# Patient Record
Sex: Female | Born: 1955 | Race: White | Hispanic: No | Marital: Single | Smoking: Former smoker
Health system: Southern US, Community
[De-identification: ages and names within clinical notes are randomized; demographics above are authoritative.]

## PROBLEM LIST (undated history)

## (undated) DIAGNOSIS — I10 Essential (primary) hypertension: Secondary | ICD-10-CM

## (undated) DIAGNOSIS — F329 Major depressive disorder, single episode, unspecified: Secondary | ICD-10-CM

## (undated) DIAGNOSIS — F631 Pyromania: Secondary | ICD-10-CM

## (undated) DIAGNOSIS — R011 Cardiac murmur, unspecified: Secondary | ICD-10-CM

## (undated) DIAGNOSIS — Z8719 Personal history of other diseases of the digestive system: Secondary | ICD-10-CM

## (undated) DIAGNOSIS — R569 Unspecified convulsions: Secondary | ICD-10-CM

## (undated) DIAGNOSIS — K219 Gastro-esophageal reflux disease without esophagitis: Secondary | ICD-10-CM

## (undated) DIAGNOSIS — M1990 Unspecified osteoarthritis, unspecified site: Secondary | ICD-10-CM

## (undated) DIAGNOSIS — F32A Depression, unspecified: Secondary | ICD-10-CM

## (undated) DIAGNOSIS — F1910 Other psychoactive substance abuse, uncomplicated: Secondary | ICD-10-CM

## (undated) HISTORY — PX: FOOT SURGERY: SHX648

## (undated) HISTORY — PX: COLONOSCOPY: SHX5424

---

## 2005-08-19 ENCOUNTER — Ambulatory Visit: Payer: Self-pay | Admitting: Family Medicine

## 2006-09-09 ENCOUNTER — Ambulatory Visit: Payer: Self-pay | Admitting: Family Medicine

## 2007-04-12 ENCOUNTER — Ambulatory Visit: Payer: Self-pay | Admitting: Gastroenterology

## 2007-09-14 ENCOUNTER — Ambulatory Visit: Payer: Self-pay | Admitting: Family Medicine

## 2008-06-13 ENCOUNTER — Emergency Department: Payer: Self-pay | Admitting: Emergency Medicine

## 2009-02-12 ENCOUNTER — Ambulatory Visit: Payer: Self-pay | Admitting: Family Medicine

## 2010-03-06 ENCOUNTER — Ambulatory Visit: Payer: Self-pay | Admitting: Family Medicine

## 2012-10-27 ENCOUNTER — Ambulatory Visit: Payer: Self-pay | Admitting: Family Medicine

## 2014-10-02 ENCOUNTER — Emergency Department: Payer: Self-pay | Admitting: Emergency Medicine

## 2016-03-31 NOTE — Patient Instructions (Addendum)
  Your procedure is scheduled on: 04-08-16 (TUESDAY) Report to Same Day Surgery 2nd floor medical mall To find out your arrival time please call [phone] between 1PM - 3PM on 04-07-16 [facility])  Remember: Instructions that are not followed completely may result in serious medical risk, up to and including death, or upon the discretion of your surgeon and anesthesiologist your surgery may need to be rescheduled.    _x___ 1. Do not eat food or drink liquids after midnight. No gum chewing or hard candies.     __x__ 2. No Alcohol for 24 hours before or after surgery.   __x__3. No Smoking for 24 prior to surgery.   ____  4. Bring all medications with you on the day of surgery if instructed.    __x__ 5. Notify your doctor if there is any change in your medical condition     (cold, fever, infections).     Do not wear jewelry, make-up, hairpins, clips or nail polish.  Do not wear lotions, powders, or perfumes. You may wear deodorant.  Do not shave 48 hours prior to surgery. Men may shave face and neck.  Do not bring valuables to the hospital.    [facility] is not responsible for any belongings or valuables.               Contacts, dentures or bridgework may not be worn into surgery.  Leave your suitcase in the car. After surgery it may be brought to your room.  For patients admitted to the hospital, discharge time is determined by your treatment team.   Patients discharged the day of surgery will not be allowed to drive home.    Please read over the following fact sheets that you were given:    _x___ Take these medicines the morning of surgery with A SIP OF WATER:    1. BENZTROPIN (COGENTIN)  2. GABAPENTIN (NEURONTIN)  3. KEPPRA (LEVETIRACETAM)  4. LISINOPRIL(ZESTRIL)  5. METOPROLOL             6. RISPERIDONE (RISPERDAL)             7. ESOMEPRAZOLE (NEXIUM)   ____ Fleet Enema (as directed)   ____ Use CHG Soap or sage wipes as directed on instruction sheet   ____ Use  inhalers on the day of surgery and bring to hospital day of surgery  ____ Stop metformin 2 days prior to surgery    ____ Take 1/2 of usual insulin dose the night before surgery and none on the morning of  surgery.   _X___ Stop aspirin or coumadin, or plavix-STOP ASPIRIN NOW  _x__ Stop Anti-inflammatories such as Advil, Aleve, Ibuprofen, Motrin, Naproxen,          Naprosyn, Goodies powders or aspirin products. Ok to take Tylenol   _X___ Stop supplements until after surgery-STOP PROBIOTIC NOW  ____ Bring C-Pap to the hospital.

## 2016-04-03 ENCOUNTER — Encounter
Admission: RE | Admit: 2016-04-03 | Discharge: 2016-04-03 | Disposition: A | Discharge status: Home / Self Care | Payer: Medicare Other | Source: Ambulatory Visit | Attending: Bariatrics | Admitting: Bariatrics

## 2016-04-03 DIAGNOSIS — Z01812 Encounter for preprocedural laboratory examination: Secondary | ICD-10-CM | POA: Insufficient documentation

## 2016-04-03 DIAGNOSIS — I10 Essential (primary) hypertension: Secondary | ICD-10-CM | POA: Insufficient documentation

## 2016-04-03 DIAGNOSIS — I447 Left bundle-branch block, unspecified: Secondary | ICD-10-CM | POA: Insufficient documentation

## 2016-04-03 HISTORY — DX: Unspecified convulsions: R56.9

## 2016-04-03 HISTORY — DX: Cardiac murmur, unspecified: R01.1

## 2016-04-03 HISTORY — DX: Personal history of other diseases of the digestive system: Z87.19

## 2016-04-03 HISTORY — DX: Essential (primary) hypertension: I10

## 2016-04-03 HISTORY — DX: Unspecified osteoarthritis, unspecified site: M19.90

## 2016-04-03 HISTORY — DX: Gastro-esophageal reflux disease without esophagitis: K21.9

## 2016-04-03 LAB — COMPREHENSIVE METABOLIC PANEL
ALBUMIN: 4 g/dL (ref 3.5–5.0)
ALT: 12 U/L — AB (ref 14–54)
AST: 20 U/L (ref 15–41)
Alkaline Phosphatase: 63 U/L (ref 38–126)
Anion gap: 9 (ref 5–15)
BUN: 7 mg/dL (ref 6–20)
CHLORIDE: 100 mmol/L — AB (ref 101–111)
CO2: 25 mmol/L (ref 22–32)
CREATININE: 0.45 mg/dL (ref 0.44–1.00)
Calcium: 8.8 mg/dL — ABNORMAL LOW (ref 8.9–10.3)
GFR calc Af Amer: >60 mL/min (ref >60)
Glucose, Bld: 89 mg/dL (ref 65–99)
POTASSIUM: 3.4 mmol/L — AB (ref 3.5–5.1)
SODIUM: 134 mmol/L — AB (ref 135–145)
Total Bilirubin: 0.2 mg/dL — ABNORMAL LOW (ref 0.3–1.2)
Total Protein: 6.5 g/dL (ref 6.5–8.1)

## 2016-04-07 ENCOUNTER — Encounter: Payer: Self-pay | Admitting: *Deleted

## 2016-04-08 ENCOUNTER — Inpatient Hospital Stay: Payer: Medicare Other | Admitting: Anesthesiology

## 2016-04-08 ENCOUNTER — Ambulatory Visit
Admission: RE | Admit: 2016-04-08 | Discharge: 2016-04-08 | Disposition: A | Discharge status: Home / Self Care | Payer: Medicare Other | Source: Ambulatory Visit | Attending: Bariatrics | Admitting: Bariatrics

## 2016-04-08 ENCOUNTER — Encounter
Admission: RE | Disposition: A | Discharge status: Home / Self Care | Payer: Self-pay | Source: Ambulatory Visit | Attending: Bariatrics

## 2016-04-08 DIAGNOSIS — K8010 Calculus of gallbladder with chronic cholecystitis without obstruction: Secondary | ICD-10-CM | POA: Insufficient documentation

## 2016-04-08 DIAGNOSIS — Z87891 Personal history of nicotine dependence: Secondary | ICD-10-CM | POA: Diagnosis not present

## 2016-04-08 DIAGNOSIS — K449 Diaphragmatic hernia without obstruction or gangrene: Secondary | ICD-10-CM | POA: Insufficient documentation

## 2016-04-08 DIAGNOSIS — Z8249 Family history of ischemic heart disease and other diseases of the circulatory system: Secondary | ICD-10-CM | POA: Diagnosis not present

## 2016-04-08 DIAGNOSIS — I10 Essential (primary) hypertension: Secondary | ICD-10-CM | POA: Insufficient documentation

## 2016-04-08 DIAGNOSIS — R569 Unspecified convulsions: Secondary | ICD-10-CM | POA: Diagnosis not present

## 2016-04-08 DIAGNOSIS — Z823 Family history of stroke: Secondary | ICD-10-CM | POA: Diagnosis not present

## 2016-04-08 DIAGNOSIS — K8020 Calculus of gallbladder without cholecystitis without obstruction: Secondary | ICD-10-CM

## 2016-04-08 HISTORY — PX: ESOPHAGOGASTRODUODENOSCOPY: SHX5428

## 2016-04-08 HISTORY — PX: CHOLECYSTECTOMY: SHX55

## 2016-04-08 HISTORY — PX: LAPAROSCOPY: SHX197

## 2016-04-08 SURGERY — LAPAROSCOPIC CHOLECYSTECTOMY WITH INTRAOPERATIVE CHOLANGIOGRAM
Anesthesia: General

## 2016-04-08 MED ORDER — GLYCOPYRROLATE 0.2 MG/ML IJ SOLN
INTRAMUSCULAR | Status: DC | PRN
  Administered 2016-04-08: 0.2 mg via INTRAVENOUS

## 2016-04-08 MED ORDER — FENTANYL CITRATE (PF) 100 MCG/2ML IJ SOLN: 25.0000 ug | INTRAMUSCULAR | Status: DC | PRN

## 2016-04-08 MED ORDER — FENTANYL CITRATE (PF) 100 MCG/2ML IJ SOLN
INTRAMUSCULAR | Status: DC | PRN
Start: 2016-04-08 — End: 2016-04-08
  Administered 2016-04-08: 100 ug via INTRAVENOUS
  Administered 2016-04-08: 50 ug via INTRAVENOUS

## 2016-04-08 MED ORDER — SCOPOLAMINE 1 MG/3DAYS TD PT72
1.0000 | MEDICATED_PATCH | Freq: Once | TRANSDERMAL | Status: DC
  Administered 2016-04-08: 1.5 mg via TRANSDERMAL

## 2016-04-08 MED ORDER — CEFAZOLIN SODIUM-DEXTROSE 2-4 GM/100ML-% IV SOLN
INTRAVENOUS | Status: AC
  Administered 2016-04-08: 2 g via INTRAVENOUS
  Filled 2016-04-08: qty 100

## 2016-04-08 MED ORDER — CEFAZOLIN SODIUM-DEXTROSE 2-4 GM/100ML-% IV SOLN
2.0000 g | Freq: Once | INTRAVENOUS | Status: AC
  Administered 2016-04-08: 2 g via INTRAVENOUS

## 2016-04-08 MED ORDER — PROMETHAZINE HCL 25 MG/ML IJ SOLN: 6.2500 mg | INTRAMUSCULAR | Status: DC | PRN

## 2016-04-08 MED ORDER — EPHEDRINE SULFATE 50 MG/ML IJ SOLN
INTRAMUSCULAR | Status: DC | PRN
  Administered 2016-04-08: 10 mg via INTRAVENOUS

## 2016-04-08 MED ORDER — BUPIVACAINE-EPINEPHRINE 0.25% -1:200000 IJ SOLN
INTRAMUSCULAR | Status: DC | PRN
  Administered 2016-04-08: 30 mL

## 2016-04-08 MED ORDER — ONDANSETRON HCL 4 MG/2ML IJ SOLN
INTRAMUSCULAR | Status: DC | PRN
  Administered 2016-04-08: 4 mg via INTRAVENOUS

## 2016-04-08 MED ORDER — LIDOCAINE HCL (CARDIAC) 20 MG/ML IV SOLN
INTRAVENOUS | Status: DC | PRN
  Administered 2016-04-08: 30 mg via INTRAVENOUS

## 2016-04-08 MED ORDER — SCOPOLAMINE 1 MG/3DAYS TD PT72
MEDICATED_PATCH | TRANSDERMAL | Status: AC
  Administered 2016-04-08: 1.5 mg via TRANSDERMAL
  Filled 2016-04-08: qty 1

## 2016-04-08 MED ORDER — DEXAMETHASONE SODIUM PHOSPHATE 10 MG/ML IJ SOLN
INTRAMUSCULAR | Status: DC | PRN
  Administered 2016-04-08: 10 mg via INTRAVENOUS

## 2016-04-08 MED ORDER — PROPOFOL 10 MG/ML IV BOLUS
INTRAVENOUS | Status: DC | PRN
  Administered 2016-04-08: 120 mg via INTRAVENOUS

## 2016-04-08 MED ORDER — ROCURONIUM BROMIDE 100 MG/10ML IV SOLN
INTRAVENOUS | Status: DC | PRN
  Administered 2016-04-08: 10 mg via INTRAVENOUS
  Administered 2016-04-08: 30 mg via INTRAVENOUS

## 2016-04-08 MED ORDER — ACETAMINOPHEN 10 MG/ML IV SOLN
INTRAVENOUS | Status: DC | PRN
  Administered 2016-04-08: 1000 mg via INTRAVENOUS

## 2016-04-08 MED ORDER — LACTATED RINGERS IV SOLN
INTRAVENOUS | Status: DC
  Administered 2016-04-08: 13:00:00 via INTRAVENOUS

## 2016-04-08 SURGICAL SUPPLY — 42 items
APPLIER CLIP ROT 10 11.4 M/L (STAPLE)
BANDAGE ELASTIC 6 LF NS (GAUZE/BANDAGES/DRESSINGS) ×6 IMPLANT
BLADE SURG SZ11 CARB STEEL (BLADE) ×3 IMPLANT
CANISTER SUCT 1200ML W/VALVE (MISCELLANEOUS) ×3 IMPLANT
CHLORAPREP W/TINT 26ML (MISCELLANEOUS) ×3 IMPLANT
CLIP APPLIE ROT 10 11.4 M/L (STAPLE) IMPLANT
DEFOGGER SCOPE WARMER CLEARIFY (MISCELLANEOUS) ×3 IMPLANT
DRAPE UTILITY 15X26 TOWEL STRL (DRAPES) ×6 IMPLANT
ENDOLOOP SUT PDS II  0 18 (SUTURE) ×2
ENDOLOOP SUT PDS II 0 18 (SUTURE) ×1 IMPLANT
FILTER LAP SMOKE EVAC STRL (MISCELLANEOUS) ×3 IMPLANT
GLOVE BIO SURGEON STRL SZ7 (GLOVE) ×6 IMPLANT
GLOVE BIOGEL PI IND STRL 8.5 (GLOVE) ×1 IMPLANT
GLOVE BIOGEL PI INDICATOR 8.5 (GLOVE) ×2
GLOVE SURG SYN 8.0 (GLOVE) ×3 IMPLANT
GOWN STRL REUS W/ TWL LRG LVL3 (GOWN DISPOSABLE) ×5 IMPLANT
GOWN STRL REUS W/TWL LRG LVL3 (GOWN DISPOSABLE) ×10
GRASPER SUT TROCAR 14GX15 (MISCELLANEOUS) ×3 IMPLANT
IRRIGATION STRYKERFLOW (MISCELLANEOUS) IMPLANT
IRRIGATOR STRYKERFLOW (MISCELLANEOUS)
IV NS 1000ML (IV SOLUTION)
IV NS 1000ML BAXH (IV SOLUTION) IMPLANT
KIT RM TURNOVER STRD PROC AR (KITS) ×3 IMPLANT
LABEL OR SOLS (LABEL) ×3 IMPLANT
LIQUID BAND (GAUZE/BANDAGES/DRESSINGS) ×3 IMPLANT
NDL SAFETY 22GX1.5 (NEEDLE) ×3 IMPLANT
NEEDLE HYPO 22GX1.5 SAFETY (NEEDLE) ×3 IMPLANT
NS IRRIG 500ML POUR BTL (IV SOLUTION) ×3 IMPLANT
PACK LAP CHOLECYSTECTOMY (MISCELLANEOUS) ×3 IMPLANT
RELOAD BLUE (STAPLE) IMPLANT
RELOAD GOLD (STAPLE) IMPLANT
RELOAD GREEN (STAPLE) IMPLANT
SHEARS HARMONIC ACE PLUS 45CM (MISCELLANEOUS) ×3 IMPLANT
SLEEVE ENDOPATH XCEL 5M (ENDOMECHANICALS) ×6 IMPLANT
SLEEVE GASTRECTOMY 36FR VISIGI (MISCELLANEOUS) ×3 IMPLANT
STAPLER ECHELON LONG 60 440 (INSTRUMENTS) IMPLANT
SUT MNCRL AB 4-0 PS2 18 (SUTURE) ×3 IMPLANT
SUT VIC AB 0 CT2 27 (SUTURE) ×3 IMPLANT
SYR 20CC LL (SYRINGE) ×3 IMPLANT
TROCAR XCEL 12X100 BLDLESS (ENDOMECHANICALS) IMPLANT
TROCAR XCEL NON-BLD 5MMX100MML (ENDOMECHANICALS) ×3 IMPLANT
TUBING INSUFFLATOR HEATED (MISCELLANEOUS) ×3 IMPLANT

## 2016-04-08 NOTE — Op Note (Signed)
PATIENT: Tracey Wyatt [birth year]  PROCEDURE PERFORMED: LAPAROSCOPIC CHOLECYSTECTOMY, UPPER ENDOSCOPY PRE-OP DIAGNOSIS: gallstone POST-OP DIAGNOSIS: gallstone ESTIMATED BLOOD LOSS: Minimal SURGEON: Ladora Daniel  ASSISTANT: Liliane Bade PA  PROCEDURE NOTE: Patient was brought to the operating room placed in supine position. General anesthesia obtained with orotracheal intubation. The abdomen and chest were sterilely prepped and draped. A varies needle introduced through a small incision in the superior fold of the umbilicus as confirmed by saline drop test. Pneumoperitoneum obtained with carbon dioxide. A 5 mm Optiview trocar then introduced into the abdominal cavity. 3 additional 5 mm trochars introduced across the upper abdomen. The gallbladder was elevated and the thickened peritoneum at the base of gallbladder scored by use of the Harmonic scalpel and a combination of blunt dissection and use of the Harmonic scalpel used to isolate the cystic duct and accompanying cystic artery. There was noted be significant densely adherent hypervascular omentum the anterior aspect of the gallbladder which was also mobilized as the cystic duct and cystic artery were identified by use of the Harmonic scalpel. A retrograde dissection of gallbladder was then performed freeing the gallbladder from the liver bed resulting in excellent hemostasis. Cystic artery then divided at several of its branch points along the base of the gallbladder. The cystic duct was then isolated proximally over distance of approximately 1and1/2 cm. The cystic duct then ligated with a 0 PDS Endoloop. The cystic duct was then divided distal to the suture.. The gallbladder was then decompressed by aspiration and retrieval of multiple heme-based gallstones. The gallbladder was then delivered through the epigastric 5 mm trocar site after dilatation the fascia by way of a hemostat. Repeat inspection of the gallbladder fossa confirmed adequate  hemostasis. The pneumoperitoneum relieved and the trochars removed. Wounds injected with quarter percent Marcaine with epinephrine and then closed with 4-0 Monocryl in the dermis followed by Dermabond. An intraoperative endoscopy then performed with the endoscope and passed under direct visualization into the esophagus. The esophagus easily cannulated as was the stomach. The patient was noted have extensive amount of bile secretions within the stomach which were easily cleared. The pylorus was widely patent. Proximal duodenum also noted be unremarkable. Within the stomach the scope was placed in the J position the patient noted have a paraesophageal hiatal hernia with approximately 3 cm of stomach extending into the lower mediastinum above the distal esophagus. No ulcerations appreciated. The distal esophagus from work for a singular tongue of what type mucosa extending above the Z line no ulcerations or mass effect associated with it. Above this the esophageal motility was noted noted to be markedly diminished. Patient allowed recover this point having tolerated the procedure well.

## 2016-04-08 NOTE — H&P (Signed)
History and physical on paper chart without changes noted.

## 2016-04-08 NOTE — Anesthesia Preprocedure Evaluation (Signed)
Anesthesia Evaluation  Patient identified by MRN, date of birth, ID band Patient awake    Reviewed: Allergy & Precautions, H&P , NPO status , Patient's Chart, lab work & pertinent test results, reviewed documented beta blocker date and time   Airway Mallampati: II  TM Distance: >3 FB Neck ROM: full    Dental no notable dental hx. (+) Edentulous Upper, Edentulous Lower   Pulmonary neg pulmonary ROS, former smoker,    Pulmonary exam normal breath sounds clear to auscultation       Cardiovascular Exercise Tolerance: Good hypertension, (-) angina(-) CAD, (-) Past MI, (-) Cardiac Stents and (-) CABG Normal cardiovascular exam(-) dysrhythmias + Valvular Problems/Murmurs  Rhythm:regular Rate:Normal     Neuro/Psych Seizures -, Well Controlled,  negative psych ROS   GI/Hepatic Neg liver ROS, hiatal hernia, GERD  ,  Endo/Other  negative endocrine ROS  Renal/GU negative Renal ROS  negative genitourinary   Musculoskeletal   Abdominal   Peds  Hematology negative hematology ROS (+)   Anesthesia Other Findings Past Medical History: No date: Arthritis No date: GERD (gastroesophageal reflux disease) No date: Heart murmur No date: History of hiatal hernia No date: Hypertension No date: Seizures ([place])     Comment: LAST ONE IN 2002 APPROXIMATELY   Reproductive/Obstetrics negative OB ROS                             Anesthesia Physical Anesthesia Plan  ASA: II  Anesthesia Plan: General   Post-op Pain Management:    Induction:   Airway Management Planned:   Additional Equipment:   Intra-op Plan:   Post-operative Plan:   Informed Consent: I have reviewed the patients History and Physical, chart, labs and discussed the procedure including the risks, benefits and alternatives for the proposed anesthesia with the patient or authorized representative who has indicated his/her understanding and  acceptance.   Dental Advisory Given  Plan Discussed with: Anesthesiologist, CRNA and Surgeon  Anesthesia Plan Comments:         Anesthesia Quick Evaluation

## 2016-04-08 NOTE — Transfer of Care (Signed)
Immediate Anesthesia Transfer of Care Note  Patient: Tracey Wyatt  Procedure(s) Performed: Procedure(s): LAPAROSCOPIC CHOLECYSTECTOMY WITH INTRAOPERATIVE CHOLANGIOGRAM (N/A) LAPAROSCOPY DIAGNOSTIC (N/A) ESOPHAGOGASTRODUODENOSCOPY (EGD) (N/A)  Patient Location: PACU  Anesthesia Type:General  Level of Consciousness: patient cooperative and lethargic  Airway & Oxygen Therapy: Patient Spontanous Breathing and Patient connected to face mask oxygen  Post-op Assessment: Report given to RN and Post -op Vital signs reviewed and stable  Post vital signs: Reviewed and stable  Last Vitals:  Vitals:   04/08/16 1242  BP: (!) 149/88  Pulse: 64  Resp: 16  Temp: 36.3 C    Last Pain:  Vitals:   04/08/16 1242  TempSrc: Tympanic         Complications: No apparent anesthesia complications

## 2016-04-08 NOTE — OR Nursing (Signed)
Called [facility] patient is a resident and got report from [place].

## 2016-04-08 NOTE — Brief Op Note (Cosign Needed)
04/08/2016  2:58 PM  PATIENT:  Tracey Wyatt  60 y.o. female  PRE-OPERATIVE DIAGNOSIS:  gallstone  POST-OPERATIVE DIAGNOSIS:  gallstone  PROCEDURE:  Procedure(s): LAPAROSCOPIC CHOLECYSTECTOMY WITH INTRAOPERATIVE CHOLANGIOGRAM (N/A) LAPAROSCOPY DIAGNOSTIC (N/A) ESOPHAGOGASTRODUODENOSCOPY (EGD) (N/A)  SURGEON:  Surgeon(s) and Role:    * Ladora Daniel, MD - Primary  PHYSICIAN ASSISTANT:   ASSISTANTS: Nawal Burling, PA-C   ANESTHESIA:   general  EBL:  No intake/output data recorded.  BLOOD ADMINISTERED:none  DRAINS: none   LOCAL MEDICATIONS USED:  BUPIVICAINE   SPECIMEN:  Source of Specimen:  Gallbladder and stones  DISPOSITION OF SPECIMEN:  PATHOLOGY  COUNTS:  YES  TOURNIQUET:  * No tourniquets in log *  DICTATION: .Dragon Dictation  PLAN OF CARE: Discharge to home after PACU  PATIENT DISPOSITION:  PACU - hemodynamically stable.   Delay start of Pharmacological VTE agent (>24hrs) due to surgical blood loss or risk of bleeding: no

## 2016-04-08 NOTE — Discharge Instructions (Signed)

## 2016-04-08 NOTE — Anesthesia Procedure Notes (Signed)
Procedure Name: Intubation Date/Time: 04/08/2016 1:26 PM Performed by: Jonna Clark Pre-anesthesia Checklist: Patient identified, Patient being monitored, Timeout performed, Emergency Drugs available and Suction available Patient Re-evaluated:Patient Re-evaluated prior to inductionOxygen Delivery Method: Circle system utilized Preoxygenation: Pre-oxygenation with 100% oxygen Intubation Type: IV induction Ventilation: Mask ventilation without difficulty Laryngoscope Size: Mac and 3 Grade View: Grade I Tube type: Oral Tube size: 7.0 mm Number of attempts: 1 Placement Confirmation: ETT inserted through vocal cords under direct vision,  positive ETCO2 and breath sounds checked- equal and bilateral Secured at: 21 cm Tube secured with: Tape Dental Injury: Teeth and Oropharynx as per pre-operative assessment

## 2016-04-09 ENCOUNTER — Encounter: Payer: Self-pay | Admitting: Bariatrics

## 2016-04-09 NOTE — Anesthesia Postprocedure Evaluation (Signed)
Anesthesia Post Note  Patient: Tracey Wyatt  Procedure(s) Performed: Procedure(s) (LRB): LAPAROSCOPIC CHOLECYSTECTOMY WITH INTRAOPERATIVE CHOLANGIOGRAM (N/A) LAPAROSCOPY DIAGNOSTIC (N/A) ESOPHAGOGASTRODUODENOSCOPY (EGD) (N/A)  Patient location during evaluation: PACU Anesthesia Type: General Level of consciousness: awake and alert Pain management: pain level controlled Vital Signs Assessment: post-procedure vital signs reviewed and stable Respiratory status: spontaneous breathing, nonlabored ventilation, respiratory function stable and patient connected to nasal cannula oxygen Cardiovascular status: blood pressure returned to baseline and stable Postop Assessment: no signs of nausea or vomiting Anesthetic complications: no    Last Vitals:  Vitals:   04/08/16 1606 04/08/16 1625  BP: 124/67 132/66  Pulse: 66 65  Resp: 16 16  Temp: 36.8 C     Last Pain:  Vitals:   04/09/16 0838  TempSrc:   PainSc: 3                  Martha Clan

## 2016-04-11 LAB — SURGICAL PATHOLOGY

## 2016-09-24 ENCOUNTER — Telehealth: Payer: Self-pay

## 2016-09-24 ENCOUNTER — Other Ambulatory Visit: Payer: Self-pay

## 2016-09-24 DIAGNOSIS — Z1211 Encounter for screening for malignant neoplasm of colon: Secondary | ICD-10-CM

## 2016-09-24 NOTE — Telephone Encounter (Signed)
Gastroenterology Pre-Procedure Review  Request Date: 10/14/16 Requesting Physician: Dr. Vicente Males  PATIENT REVIEW QUESTIONS: The patient responded to the following health history questions as indicated:    1. Are you having any GI issues? no 2. Do you have a personal history of Polyps? no 3. Do you have a family history of Colon Cancer or Polyps? yes (Brother) 4. Diabetes Mellitus? no 5. Joint replacements in the past 12 months?no 6. Major health problems in the past 3 months?yes (arm fracture) 7. Any artificial heart valves, MVP, or defibrillator?no    MEDICATIONS & ALLERGIES:    Patient reports the following regarding taking any anticoagulation/antiplatelet therapy:   Plavix, Coumadin, Eliquis, Xarelto, Lovenox, Pradaxa, Brilinta, or Effient? no Aspirin? yes (325mg )  Patient confirms/reports the following medications:  Current Outpatient Prescriptions  Medication Sig Dispense Refill  . Acetaminophen (MAPAP) 500 MG coapsule Take 2 capsules by mouth every 6 (six) hours as needed for fever.    Marland Kitchen alendronate (FOSAMAX) 70 MG tablet Take 70 mg by mouth once a week. Take with a full glass of water on an empty stomach EVERY WEDNESDAY    . aspirin 325 MG tablet Take 325 mg by mouth daily.    . benztropine (COGENTIN) 0.5 MG tablet Take 0.5 mg by mouth 2 (two) times daily.    . Calcium Carbonate-Vitamin D3 (CALCIUM 600-D) 600-400 MG-UNIT TABS Take 1 tablet by mouth 2 (two) times daily.    . Cyanocobalamin (VITAMIN B 12 PO) Take 1,000 mcg by mouth daily.    Marland Kitchen docusate sodium (COLACE) 100 MG capsule Take 100 mg by mouth 2 (two) times daily.    Marland Kitchen esomeprazole (NEXIUM) 40 MG capsule Take 40 mg by mouth 2 (two) times daily before a meal.     . gabapentin (NEURONTIN) 300 MG capsule Take 300 mg by mouth 4 (four) times daily.    Marland Kitchen HYDROcodone-acetaminophen (NORCO/VICODIN) 5-325 MG tablet Take 1 tablet by mouth every 6 (six) hours as needed for moderate pain.    Marland Kitchen levETIRAcetam (KEPPRA) 500 MG tablet Take  500 mg by mouth 2 (two) times daily.    Marland Kitchen lisinopril (PRINIVIL,ZESTRIL) 10 MG tablet Take 10 mg by mouth every morning.    . metoprolol succinate (TOPROL-XL) 25 MG 24 hr tablet Take 25 mg by mouth 2 (two) times daily.     . Multiple Vitamin (MULTIVITAMIN) tablet Take 1 tablet by mouth daily.    . ondansetron (ZOFRAN) 4 MG tablet Take 4 mg by mouth every 8 (eight) hours as needed for nausea or vomiting.    Marland Kitchen PARoxetine (PAXIL) 40 MG tablet Take 40 mg by mouth at bedtime.     . polyethylene glycol (MIRALAX / GLYCOLAX) packet Take 17 g by mouth daily.    . Probiotic Product (PROBIOTIC DAILY PO) Take 1 tablet by mouth daily.    . risperiDONE (RISPERDAL) 0.5 MG tablet Take 0.5 mg by mouth 2 (two) times daily.      No current facility-administered medications for this visit.     Patient confirms/reports the following allergies:  Not on File  No orders of the defined types were placed in this encounter.   AUTHORIZATION INFORMATION Primary Insurance: 1D#: Group #:  Secondary Insurance: 1D#: Group #:  SCHEDULE INFORMATION: Date: 10/14/16 Time: Location: [place]

## 2016-10-21 ENCOUNTER — Telehealth: Payer: Self-pay | Admitting: Gastroenterology

## 2016-10-21 NOTE — Telephone Encounter (Signed)
Tracey Wyatt w/ [facility] called and caretaker had her procedure date down wrong and needs to reschedule 10/23/16. Please call Elvis Coil (caretaker) [phone].

## 2016-10-23 ENCOUNTER — Ambulatory Visit: Admission: RE | Admit: 2016-10-23 | Payer: Medicare Other | Source: Ambulatory Visit | Admitting: Gastroenterology

## 2016-10-23 ENCOUNTER — Encounter: Admission: RE | Payer: Self-pay | Source: Ambulatory Visit

## 2016-10-23 ENCOUNTER — Other Ambulatory Visit: Payer: Self-pay

## 2016-10-23 DIAGNOSIS — Z1211 Encounter for screening for malignant neoplasm of colon: Secondary | ICD-10-CM

## 2016-10-23 SURGERY — COLONOSCOPY WITH PROPOFOL
Anesthesia: General

## 2016-10-24 ENCOUNTER — Telehealth: Payer: Self-pay | Admitting: Gastroenterology

## 2016-10-24 NOTE — Telephone Encounter (Signed)
Queen from [facility] needs to reschedule pt colonoscopy to April 24 or 25th. She will be out of town.  [phone]

## 2016-10-27 NOTE — Telephone Encounter (Signed)
Received VM from [place] at Nursing facility requesting 2nd reschedule of procedure.   LVM for callback.

## 2016-10-28 ENCOUNTER — Encounter: Admission: RE | Payer: Self-pay | Source: Ambulatory Visit

## 2016-10-28 ENCOUNTER — Ambulatory Visit: Admission: RE | Admit: 2016-10-28 | Payer: Medicare Other | Source: Ambulatory Visit | Admitting: Gastroenterology

## 2016-10-28 SURGERY — COLONOSCOPY WITH PROPOFOL
Anesthesia: General

## 2016-10-29 ENCOUNTER — Other Ambulatory Visit: Payer: Self-pay

## 2016-10-29 DIAGNOSIS — Z1211 Encounter for screening for malignant neoplasm of colon: Secondary | ICD-10-CM

## 2016-11-18 ENCOUNTER — Encounter
Admission: RE | Disposition: A | Discharge status: Home / Self Care | Payer: Self-pay | Source: Ambulatory Visit | Attending: Gastroenterology

## 2016-11-18 ENCOUNTER — Ambulatory Visit: Payer: Medicare Other | Admitting: Registered Nurse

## 2016-11-18 ENCOUNTER — Ambulatory Visit
Admission: RE | Admit: 2016-11-18 | Discharge: 2016-11-18 | Disposition: A | Discharge status: Home / Self Care | Payer: Medicare Other | Source: Ambulatory Visit | Attending: Gastroenterology | Admitting: Gastroenterology

## 2016-11-18 ENCOUNTER — Encounter: Payer: Self-pay | Admitting: *Deleted

## 2016-11-18 DIAGNOSIS — Z8371 Family history of colonic polyps: Secondary | ICD-10-CM | POA: Insufficient documentation

## 2016-11-18 DIAGNOSIS — Z87891 Personal history of nicotine dependence: Secondary | ICD-10-CM | POA: Insufficient documentation

## 2016-11-18 DIAGNOSIS — M1990 Unspecified osteoarthritis, unspecified site: Secondary | ICD-10-CM | POA: Insufficient documentation

## 2016-11-18 DIAGNOSIS — Z955 Presence of coronary angioplasty implant and graft: Secondary | ICD-10-CM | POA: Insufficient documentation

## 2016-11-18 DIAGNOSIS — D123 Benign neoplasm of transverse colon: Secondary | ICD-10-CM | POA: Diagnosis not present

## 2016-11-18 DIAGNOSIS — D122 Benign neoplasm of ascending colon: Secondary | ICD-10-CM | POA: Diagnosis not present

## 2016-11-18 DIAGNOSIS — F329 Major depressive disorder, single episode, unspecified: Secondary | ICD-10-CM | POA: Insufficient documentation

## 2016-11-18 DIAGNOSIS — Z79899 Other long term (current) drug therapy: Secondary | ICD-10-CM | POA: Insufficient documentation

## 2016-11-18 DIAGNOSIS — Z1211 Encounter for screening for malignant neoplasm of colon: Secondary | ICD-10-CM | POA: Diagnosis not present

## 2016-11-18 DIAGNOSIS — K219 Gastro-esophageal reflux disease without esophagitis: Secondary | ICD-10-CM | POA: Insufficient documentation

## 2016-11-18 DIAGNOSIS — I10 Essential (primary) hypertension: Secondary | ICD-10-CM | POA: Diagnosis not present

## 2016-11-18 DIAGNOSIS — D125 Benign neoplasm of sigmoid colon: Secondary | ICD-10-CM | POA: Diagnosis not present

## 2016-11-18 DIAGNOSIS — R569 Unspecified convulsions: Secondary | ICD-10-CM | POA: Insufficient documentation

## 2016-11-18 DIAGNOSIS — Z7982 Long term (current) use of aspirin: Secondary | ICD-10-CM | POA: Insufficient documentation

## 2016-11-18 HISTORY — DX: Depression, unspecified: F32.A

## 2016-11-18 HISTORY — DX: Major depressive disorder, single episode, unspecified: F32.9

## 2016-11-18 HISTORY — PX: COLONOSCOPY WITH PROPOFOL: SHX5780

## 2016-11-18 LAB — HM COLONOSCOPY

## 2016-11-18 SURGERY — COLONOSCOPY WITH PROPOFOL
Anesthesia: General

## 2016-11-18 MED ORDER — LIDOCAINE HCL (PF) 2 % IJ SOLN
INTRAMUSCULAR | Status: AC
  Filled 2016-11-18: qty 2

## 2016-11-18 MED ORDER — METHYLENE BLUE 0.5 % INJ SOLN
INTRAVENOUS | Status: DC | PRN
  Administered 2016-11-18: 1 mL via SUBMUCOSAL

## 2016-11-18 MED ORDER — METHYLENE BLUE 0.5 % INJ SOLN
INTRAVENOUS | Status: AC
  Filled 2016-11-18: qty 10

## 2016-11-18 MED ORDER — EPHEDRINE SULFATE 50 MG/ML IJ SOLN
INTRAMUSCULAR | Status: DC | PRN
  Administered 2016-11-18: 10 mg via INTRAVENOUS
  Administered 2016-11-18: 15 mg via INTRAVENOUS

## 2016-11-18 MED ORDER — EPHEDRINE SULFATE 50 MG/ML IJ SOLN
INTRAMUSCULAR | Status: AC
  Filled 2016-11-18: qty 2

## 2016-11-18 MED ORDER — SODIUM CHLORIDE 0.9 % IV SOLN
INTRAVENOUS | Status: DC
  Administered 2016-11-18: 1000 mL via INTRAVENOUS
  Administered 2016-11-18: 10:00:00 via INTRAVENOUS

## 2016-11-18 MED ORDER — PROPOFOL 10 MG/ML IV BOLUS
INTRAVENOUS | Status: DC | PRN
  Administered 2016-11-18: 50 mg via INTRAVENOUS

## 2016-11-18 MED ORDER — PROPOFOL 500 MG/50ML IV EMUL
INTRAVENOUS | Status: DC | PRN
  Administered 2016-11-18: 125 ug/kg/min via INTRAVENOUS

## 2016-11-18 MED ORDER — PROPOFOL 10 MG/ML IV BOLUS
INTRAVENOUS | Status: AC
  Filled 2016-11-18: qty 40

## 2016-11-18 MED ORDER — MIDAZOLAM HCL 2 MG/2ML IJ SOLN
INTRAMUSCULAR | Status: AC
  Filled 2016-11-18: qty 2

## 2016-11-18 MED ORDER — LIDOCAINE HCL (CARDIAC) 20 MG/ML IV SOLN
INTRAVENOUS | Status: DC | PRN
  Administered 2016-11-18: 60 mg via INTRAVENOUS

## 2016-11-18 NOTE — Transfer of Care (Signed)
Immediate Anesthesia Transfer of Care Note  Patient: Tracey Wyatt  Procedure(s) Performed: Procedure(s): COLONOSCOPY WITH PROPOFOL (N/A)  Patient Location: PACU and Endoscopy Unit  Anesthesia Type:General  Level of Consciousness: sedated  Airway & Oxygen Therapy: Patient Spontanous Breathing and Patient connected to nasal cannula oxygen  Post-op Assessment: Report given to RN and Post -op Vital signs reviewed and stable  Post vital signs: Reviewed and stable  Last Vitals:  Vitals:   11/18/16 1045 11/18/16 1047  BP:  (!) 109/57  Pulse:  69  Resp:  19  Temp: (P) 37.1 C 67.8 C    Complications: No apparent anesthesia complications

## 2016-11-18 NOTE — Anesthesia Procedure Notes (Signed)
Date/Time: 11/18/2016 9:55 AM Performed by: Doreen Salvage Pre-anesthesia Checklist: Patient identified, Emergency Drugs available, Suction available and Patient being monitored Patient Re-evaluated:Patient Re-evaluated prior to inductionOxygen Delivery Method: Nasal cannula Intubation Type: IV induction Dental Injury: Teeth and Oropharynx as per pre-operative assessment  Comments: Nasal cannula with etCO2 monitoring

## 2016-11-18 NOTE — Op Note (Signed)
[facility] Gastroenterology Patient Name: Tracey Wyatt Procedure Date: 11/18/2016 9:52 AM MRN: [id number] Account #: [id number] Date of Birth: 1956/06/23 Admit Type: Outpatient Age: 61 Room: [facility] ENDO ROOM 1 Gender: Female Note Status: Finalized Procedure:            Colonoscopy Indications:          Colon cancer screening in patient at increased risk:                        Family history of 1st-degree relative with colon polyps Providers:            Jonathon Bellows MD, MD Referring MD:         Birdie Sons (Referring MD) Medicines:            Monitored Anesthesia Care Complications:        No immediate complications. Procedure:            Pre-Anesthesia Assessment:                       - Prior to the procedure, a History and Physical was                        performed, and patient medications, allergies and                        sensitivities were reviewed. The patient's tolerance of                        previous anesthesia was reviewed.                       - The risks and benefits of the procedure and the                        sedation options and risks were discussed with the                        patient. All questions were answered and informed                        consent was obtained.                       - ASA Grade Assessment: III - A patient with severe                        systemic disease.                       After obtaining informed consent, the colonoscope was                        passed under direct vision. Throughout the procedure,                        the patient's blood pressure, pulse, and oxygen                        saturations were monitored continuously. The  Colonoscope was introduced through the anus and                        advanced to the the cecum, identified by the                        appendiceal orifice, IC valve and transillumination.                        The colonoscopy was  performed with ease. The patient                        tolerated the procedure well. The quality of the bowel                        preparation was good. Findings:      Three sessile polyps were found in the sigmoid colon and ascending       colon. The polyps were 6 to 10 mm in size. These polyps were removed       with a cold snare. Resection and retrieval were complete.      A 10 mm polyp was found in the hepatic flexure. The polyp was sessile.       To prevent bleeding post-intervention, one hemostatic clip was       successfully placed. There was no bleeding during the maneuver.      The exam was otherwise without abnormality on direct and retroflexion       views. Impression:           - Three 6 to 10 mm polyps in the sigmoid colon and in                        the ascending colon, removed with a cold snare.                        Resected and retrieved.                       - One 10 mm polyp at the hepatic flexure. Clip was                        placed.                       - The examination was otherwise normal on direct and                        retroflexion views. Recommendation:       - Discharge patient to home (with escort).                       - Resume previous diet.                       - Await pathology results.                       - Repeat colonoscopy in 3 years for surveillance. Procedure Code(s):    --- Professional ---                       [phone],  Colonoscopy, flexible; with removal of tumor(s),                        polyp(s), or other lesion(s) by snare technique Diagnosis Code(s):    --- Professional ---                       Z83.71, Family history of colonic polyps                       D12.5, Benign neoplasm of sigmoid colon                       D12.2, Benign neoplasm of ascending colon                       D12.3, Benign neoplasm of transverse colon (hepatic                        flexure or splenic flexure) CPT copyright 2016 American Medical  Association. All rights reserved. The codes documented in this report are preliminary and upon coder review may  be revised to meet current compliance requirements. Jonathon Bellows, MD Jonathon Bellows MD, MD 11/18/2016 10:42:31 AM This report has been signed electronically. Number of Addenda: 0 Note Initiated On: 11/18/2016 9:52 AM Scope Withdrawal Time: 0 hours 30 minutes 47 seconds  Total Procedure Duration: 0 hours 36 minutes 53 seconds       [facility]

## 2016-11-18 NOTE — Anesthesia Preprocedure Evaluation (Addendum)
Anesthesia Evaluation  Patient identified by MRN, date of birth, ID band Patient awake    Reviewed: Allergy & Precautions, H&P , NPO status , Patient's Chart, lab work & pertinent test results, reviewed documented beta blocker date and time   Airway Mallampati: II  TM Distance: >3 FB Neck ROM: full    Dental no notable dental hx. (+) Edentulous Upper, Edentulous Lower   Pulmonary neg pulmonary ROS, former smoker,    Pulmonary exam normal breath sounds clear to auscultation       Cardiovascular Exercise Tolerance: Good hypertension, Pt. on medications (-) angina(-) CAD, (-) Past MI, (-) Cardiac Stents and (-) CABG Normal cardiovascular exam(-) dysrhythmias + Valvular Problems/Murmurs  Rhythm:regular Rate:Normal     Neuro/Psych Seizures -, Well Controlled,  PSYCHIATRIC DISORDERS Depression negative psych ROS   GI/Hepatic Neg liver ROS, hiatal hernia, GERD  ,  Endo/Other  negative endocrine ROS  Renal/GU negative Renal ROS  negative genitourinary   Musculoskeletal  (+) Arthritis , Osteoarthritis,    Abdominal   Peds negative pediatric ROS (+)  Hematology negative hematology ROS (+)   Anesthesia Other Findings Past Medical History: No date: Arthritis No date: GERD (gastroesophageal reflux disease) No date: Heart murmur No date: History of hiatal hernia No date: Hypertension No date: Seizures ([place])     Comment: LAST ONE IN 2002 APPROXIMATELY   Reproductive/Obstetrics negative OB ROS                             Anesthesia Physical  Anesthesia Plan  ASA: III  Anesthesia Plan: General   Post-op Pain Management:    Induction: Intravenous  Airway Management Planned: Nasal Cannula  Additional Equipment:   Intra-op Plan:   Post-operative Plan:   Informed Consent: I have reviewed the patients History and Physical, chart, labs and discussed the procedure including the risks, benefits  and alternatives for the proposed anesthesia with the patient or authorized representative who has indicated his/her understanding and acceptance.   Dental Advisory Given  Plan Discussed with: Anesthesiologist, CRNA and Surgeon  Anesthesia Plan Comments:         Anesthesia Quick Evaluation

## 2016-11-18 NOTE — Anesthesia Post-op Follow-up Note (Cosign Needed)
Anesthesia QCDR form completed.        

## 2016-11-18 NOTE — H&P (Signed)
Jonathon Bellows MD [address] [place], [place] [address] Phone: [phone] Fax : [phone]  Primary Care Physician:  Birdie Sons, MD (Inactive) Primary Gastroenterologist:  Dr. Jonathon Bellows   Pre-Procedure History & Physical: HPI:  Tracey Wyatt is a 61 y.o. female is here for an colonoscopy.   Past Medical History:  Diagnosis Date  . Arthritis   . Depression   . GERD (gastroesophageal reflux disease)   . Heart murmur   . History of hiatal hernia   . Hypertension   . Seizures ([place])    LAST ONE IN 2002 APPROXIMATELY    Past Surgical History:  Procedure Laterality Date  . CHOLECYSTECTOMY N/A 04/08/2016   Procedure: LAPAROSCOPIC CHOLECYSTECTOMY WITH INTRAOPERATIVE CHOLANGIOGRAM;  Surgeon: Ladora Daniel, MD;  Location: [facility] ORS;  Service: General;  Laterality: N/A;  . COLONOSCOPY    . ESOPHAGOGASTRODUODENOSCOPY N/A 04/08/2016   Procedure: ESOPHAGOGASTRODUODENOSCOPY (EGD);  Surgeon: Ladora Daniel, MD;  Location: [facility];  Service: General;  Laterality: N/A;  . FOOT SURGERY Bilateral   . LAPAROSCOPY N/A 04/08/2016   Procedure: LAPAROSCOPY DIAGNOSTIC;  Surgeon: Ladora Daniel, MD;  Location: [facility];  Service: General;  Laterality: N/A;    Prior to Admission medications   Medication Sig Start Date End Date Taking? Authorizing Provider  divalproex (DEPAKOTE) 250 MG DR tablet Take 250 mg by mouth 3 (three) times daily.   Yes Historical Provider, MD  metoprolol succinate (TOPROL-XL) 25 MG 24 hr tablet Take 25 mg by mouth 2 (two) times daily.    Yes Historical Provider, MD  Acetaminophen (MAPAP) 500 MG coapsule Take 2 capsules by mouth every 6 (six) hours as needed for fever.    Historical Provider, MD  alendronate (FOSAMAX) 70 MG tablet Take 70 mg by mouth once a week. Take with a full glass of water on an empty stomach EVERY WEDNESDAY    Historical Provider, MD  aspirin 325 MG tablet Take 325 mg by mouth daily.    Historical Provider, MD  benztropine  (COGENTIN) 0.5 MG tablet Take 0.5 mg by mouth 2 (two) times daily.    Historical Provider, MD  Calcium Carbonate-Vitamin D3 (CALCIUM 600-D) 600-400 MG-UNIT TABS Take 1 tablet by mouth 2 (two) times daily.    Historical Provider, MD  Cyanocobalamin (VITAMIN B 12 PO) Take 1,000 mcg by mouth daily.    Historical Provider, MD  esomeprazole (NEXIUM) 40 MG capsule Take 40 mg by mouth 2 (two) times daily before a meal.     Historical Provider, MD  gabapentin (NEURONTIN) 300 MG capsule Take 300 mg by mouth 4 (four) times daily.    Historical Provider, MD  levETIRAcetam (KEPPRA) 500 MG tablet Take 500 mg by mouth 2 (two) times daily.    Historical Provider, MD  lisinopril (PRINIVIL,ZESTRIL) 10 MG tablet Take 10 mg by mouth every morning.    Historical Provider, MD  Multiple Vitamin (MULTIVITAMIN) tablet Take 1 tablet by mouth daily.    Historical Provider, MD  PARoxetine (PAXIL) 40 MG tablet Take 40 mg by mouth at bedtime.     Historical Provider, MD  polyethylene glycol (MIRALAX / GLYCOLAX) packet Take 17 g by mouth daily.    Historical Provider, MD  Probiotic Product (PROBIOTIC DAILY PO) Take 1 tablet by mouth daily.    Historical Provider, MD    Allergies as of 10/29/2016  . (Not on File)    History reviewed. No pertinent family history.  Social History   Social History  . Marital  status: Single    Spouse name: N/A  . Number of children: N/A  . Years of education: N/A   Occupational History  . Not on file.   Social History Main Topics  . Smoking status: Former Smoker    Packs/day: 0.50    Years: 10.00    Types: Cigarettes    Quit date: 03/31/1996  . Smokeless tobacco: Never Used  . Alcohol use No  . Drug use: No  . Sexual activity: Not on file   Other Topics Concern  . Not on file   Social History Narrative  . No narrative on file    Review of Systems: See HPI, otherwise negative ROS  Physical Exam: BP 130/67   Pulse 70   Temp (!) 96.2 F (35.7 C)   Resp 17   Ht 5'  6" (1.676 m)   Wt 163 lb (73.9 kg)   SpO2 97%   BMI 26.31 kg/m  General:   Alert,  pleasant and cooperative in NAD Head:  Normocephalic and atraumatic. Neck:  Supple; no masses or thyromegaly. Lungs:  Clear throughout to auscultation.    Heart:  Regular rate and rhythm. Abdomen:  Soft, nontender and nondistended. Normal bowel sounds, without guarding, and without rebound.   Neurologic:  Alert and  oriented x4;  grossly normal neurologically.  Impression/Plan: Tracey Wyatt is here for an colonoscopy to be performed for colorectal cancer screening with history of polyps in her brother   Risks, benefits, limitations, and alternatives regarding  colonoscopy have been reviewed with the patient.  Questions have been answered.  All parties agreeable.   Jonathon Bellows, MD  11/18/2016, 9:46 AM

## 2016-11-19 ENCOUNTER — Encounter: Payer: Self-pay | Admitting: Gastroenterology

## 2016-11-19 LAB — SURGICAL PATHOLOGY

## 2016-11-19 NOTE — Anesthesia Postprocedure Evaluation (Signed)
Anesthesia Post Note  Patient: Tracey Wyatt  Procedure(s) Performed: Procedure(s) (LRB): COLONOSCOPY WITH PROPOFOL (N/A)  Patient location during evaluation: PACU Anesthesia Type: General Level of consciousness: awake and alert and oriented Pain management: pain level controlled Vital Signs Assessment: post-procedure vital signs reviewed and stable Respiratory status: spontaneous breathing Cardiovascular status: blood pressure returned to baseline Anesthetic complications: no     Last Vitals:  Vitals:   11/18/16 1105 11/18/16 1115  BP: 116/74 123/63  Pulse: (!) 115 66  Resp: 20 20  Temp:      Last Pain:  Vitals:   11/18/16 1047  TempSrc: Tympanic                 Shaya Altamura

## 2016-11-23 ENCOUNTER — Encounter: Payer: Self-pay | Admitting: Gastroenterology

## 2019-08-29 ENCOUNTER — Other Ambulatory Visit: Payer: Self-pay | Admitting: Internal Medicine

## 2019-08-29 DIAGNOSIS — Z1231 Encounter for screening mammogram for malignant neoplasm of breast: Secondary | ICD-10-CM

## 2019-09-21 ENCOUNTER — Encounter: Payer: Self-pay | Admitting: Obstetrics and Gynecology

## 2019-10-19 ENCOUNTER — Ambulatory Visit (INDEPENDENT_AMBULATORY_CARE_PROVIDER_SITE_OTHER): Payer: Medicare Other | Admitting: Obstetrics and Gynecology

## 2019-10-19 ENCOUNTER — Other Ambulatory Visit: Payer: Self-pay

## 2019-10-19 ENCOUNTER — Encounter: Payer: Self-pay | Admitting: Obstetrics and Gynecology

## 2019-10-19 ENCOUNTER — Other Ambulatory Visit (HOSPITAL_COMMUNITY)
Admission: RE | Admit: 2019-10-19 | Discharge: 2019-10-19 | Disposition: A | Discharge status: Home / Self Care | Payer: Medicare Other | Source: Ambulatory Visit | Attending: Obstetrics and Gynecology | Admitting: Obstetrics and Gynecology

## 2019-10-19 VITALS — BP 110/70 | Ht 62.0 in | Wt 116.0 lb

## 2019-10-19 DIAGNOSIS — Z780 Asymptomatic menopausal state: Secondary | ICD-10-CM | POA: Diagnosis not present

## 2019-10-19 DIAGNOSIS — R87615 Unsatisfactory cytologic smear of cervix: Secondary | ICD-10-CM | POA: Diagnosis not present

## 2019-10-19 DIAGNOSIS — Z124 Encounter for screening for malignant neoplasm of cervix: Secondary | ICD-10-CM | POA: Diagnosis present

## 2019-10-19 DIAGNOSIS — Z01419 Encounter for gynecological examination (general) (routine) without abnormal findings: Secondary | ICD-10-CM | POA: Diagnosis not present

## 2019-10-19 DIAGNOSIS — Z1151 Encounter for screening for human papillomavirus (HPV): Secondary | ICD-10-CM | POA: Insufficient documentation

## 2019-10-19 DIAGNOSIS — Z0000 Encounter for general adult medical examination without abnormal findings: Secondary | ICD-10-CM

## 2019-10-19 NOTE — Progress Notes (Signed)
Patient ID: Tracey Wyatt, female   DOB: 1955/10/21, 64 y.o.   MRN: [id number]  Reason for Consult: Referral (Pap smear )   Referred by Merlene Laughter, MD  Subjective:     HPI:  Tracey Wyatt is a 64 y.o. female . She is accompanied by her home health aid. She has no complaints. She and her health aid deny vaginal bleeding. Deny any other complaints.   Past Medical History:  Diagnosis Date  . Arthritis   . Depression   . GERD (gastroesophageal reflux disease)   . Heart murmur   . History of hiatal hernia   . Hypertension   . Seizures ([place])    LAST ONE IN 2002 APPROXIMATELY   History reviewed. No pertinent family history. Past Surgical History:  Procedure Laterality Date  . CHOLECYSTECTOMY N/A 04/08/2016   Procedure: LAPAROSCOPIC CHOLECYSTECTOMY WITH INTRAOPERATIVE CHOLANGIOGRAM;  Surgeon: Ladora Daniel, MD;  Location: [facility];  Service: General;  Laterality: N/A;  . COLONOSCOPY    . COLONOSCOPY WITH PROPOFOL N/A 11/18/2016   Procedure: COLONOSCOPY WITH PROPOFOL;  Surgeon: Jonathon Bellows, MD;  Location: [facility];  Service: Endoscopy;  Laterality: N/A;  . ESOPHAGOGASTRODUODENOSCOPY N/A 04/08/2016   Procedure: ESOPHAGOGASTRODUODENOSCOPY (EGD);  Surgeon: Ladora Daniel, MD;  Location: [facility];  Service: General;  Laterality: N/A;  . FOOT SURGERY Bilateral   . LAPAROSCOPY N/A 04/08/2016   Procedure: LAPAROSCOPY DIAGNOSTIC;  Surgeon: Ladora Daniel, MD;  Location: [facility];  Service: General;  Laterality: N/A;    Short Social History:  Social History   Tobacco Use  . Smoking status: Former Smoker    Packs/day: 0.50    Years: 10.00    Pack years: 5.00    Types: Cigarettes    Quit date: 03/31/1996    Years since quitting: 23.5  . Smokeless tobacco: Never Used  Substance Use Topics  . Alcohol use: No    Allergies  Allergen Reactions  . Morphine Other (See Comments)    Other Reaction: Not Assessed  . Procaine Other (See Comments)    Current  Outpatient Medications  Medication Sig Dispense Refill  . alendronate (FOSAMAX) 70 MG tablet Take 70 mg by mouth once a week. Take with a full glass of water on an empty stomach EVERY WEDNESDAY    . aspirin 325 MG tablet Take 325 mg by mouth daily.    . benztropine (COGENTIN) 0.5 MG tablet Take 0.5 mg by mouth 2 (two) times daily.    . divalproex (DEPAKOTE) 250 MG DR tablet Take 250 mg by mouth 3 (three) times daily.    Marland Kitchen esomeprazole (NEXIUM) 40 MG capsule Take 40 mg by mouth 2 (two) times daily before a meal.     . ferrous sulfate (FER-IN-SOL) 75 (15 Fe) MG/ML SOLN Take by mouth.    . gabapentin (NEURONTIN) 300 MG capsule Take 300 mg by mouth 4 (four) times daily.    Marland Kitchen levETIRAcetam (KEPPRA) 1000 MG tablet Take 1,000 mg by mouth 2 (two) times daily.    Marland Kitchen lisinopril (PRINIVIL,ZESTRIL) 10 MG tablet Take 10 mg by mouth every morning.    . metoprolol tartrate (LOPRESSOR) 25 MG tablet Take 25 mg by mouth 2 (two) times daily.    . Multiple Vitamin (MULTIVITAMIN) tablet Take 1 tablet by mouth daily.    Marland Kitchen PARoxetine (PAXIL) 40 MG tablet Take 40 mg by mouth at bedtime.     . polyethylene glycol (MIRALAX / GLYCOLAX) packet Take 17 g by mouth daily.  No current facility-administered medications for this visit.    Review of Systems  Constitutional: Negative for chills, fatigue, fever and unexpected weight change.  HENT: Negative for trouble swallowing.  Eyes: Negative for loss of vision.  Respiratory: Negative for cough, shortness of breath and wheezing.  Cardiovascular: Negative for chest pain, leg swelling, palpitations and syncope.  GI: Negative for abdominal pain, blood in stool, diarrhea, nausea and vomiting.  GU: Negative for difficulty urinating, dysuria, frequency and hematuria.  Musculoskeletal: Negative for back pain, leg pain and joint pain.  Skin: Negative for rash.  Neurological: Negative for dizziness, headaches, light-headedness, numbness and seizures.  Psychiatric: Negative for  behavioral problem, confusion, depressed mood and sleep disturbance.        Objective:  Objective   Vitals:   10/19/19 0931  BP: 110/70  Weight: 116 lb (52.6 kg)  Height: 5\' 2"  (1.575 m)   Body mass index is 21.22 kg/m.  Physical Exam Vitals and nursing note reviewed.  Constitutional:      Appearance: She is well-developed.  HENT:     Head: Normocephalic and atraumatic.  Eyes:     Pupils: Pupils are equal, round, and reactive to light.  Cardiovascular:     Rate and Rhythm: Normal rate and regular rhythm.  Pulmonary:     Effort: Pulmonary effort is normal. No respiratory distress.  Chest:     Breasts:        Right: Normal. No swelling, bleeding, inverted nipple, mass, nipple discharge, skin change or tenderness.        Left: Normal. No swelling, bleeding, inverted nipple, mass, nipple discharge, skin change or tenderness.  Genitourinary:    Comments: External: Normal appearing vulva. No lesions noted.  Speculum examination: Normal appearing cervix. No blood in the vaginal vault. no discharge.   Bimanual examination: Uterus midline, non-tender, normal in size, shape and contour.  No CMT. No adnexal masses. No adnexal tenderness. Pelvis not fixed. Skin:    General: Skin is warm and dry.  Neurological:     Mental Status: She is alert and oriented to person, place, and time.  Psychiatric:        Behavior: Behavior normal.        Thought Content: Thought content normal.        Judgment: Judgment normal.        Assessment/Plan:     64 yo here for cervical cancer screening and breast and pelvic exam 1. Pap smear collected and sent.  2. Breast and pelvic exams normal. Encouraged mammogram. Given card to call ans schedule the appointment.   More than 20 minutes were spent face to face with the patient in the room, reviewing the medical record, labs and images, and coordinating care for the patient. The plan of management was discussed in detail and counseling was provided.     Adrian Prows MD [facility], [facility] 10/19/2019 10:12 AM

## 2019-10-21 LAB — CYTOLOGY - PAP
Adequacy: ABNORMAL
Comment: NEGATIVE
High risk HPV: NEGATIVE

## 2019-11-01 NOTE — Progress Notes (Signed)
Called patient, no answer. Needs to repeat pap smear, left generic message to return call

## 2019-11-02 ENCOUNTER — Other Ambulatory Visit: Payer: Self-pay | Admitting: Obstetrics and Gynecology

## 2019-11-02 NOTE — Progress Notes (Signed)
Spoke with Tracey Wyatt and elder care facility regarding the need to repeat the pap smear. Transferred to schedule appointment.

## 2019-11-14 ENCOUNTER — Other Ambulatory Visit: Payer: Self-pay

## 2019-11-14 ENCOUNTER — Ambulatory Visit (INDEPENDENT_AMBULATORY_CARE_PROVIDER_SITE_OTHER): Payer: Medicare Other | Admitting: Obstetrics and Gynecology

## 2019-11-14 ENCOUNTER — Encounter: Payer: Self-pay | Admitting: Obstetrics and Gynecology

## 2019-11-14 ENCOUNTER — Other Ambulatory Visit (HOSPITAL_COMMUNITY)
Admission: RE | Admit: 2019-11-14 | Discharge: 2019-11-14 | Disposition: A | Discharge status: Home / Self Care | Payer: Medicare Other | Source: Ambulatory Visit | Attending: Obstetrics and Gynecology | Admitting: Obstetrics and Gynecology

## 2019-11-14 VITALS — BP 120/80 | Ht 64.0 in | Wt 114.0 lb

## 2019-11-14 DIAGNOSIS — Z124 Encounter for screening for malignant neoplasm of cervix: Secondary | ICD-10-CM | POA: Diagnosis present

## 2019-11-14 DIAGNOSIS — Z1151 Encounter for screening for human papillomavirus (HPV): Secondary | ICD-10-CM | POA: Insufficient documentation

## 2019-11-14 DIAGNOSIS — Z780 Asymptomatic menopausal state: Secondary | ICD-10-CM | POA: Insufficient documentation

## 2019-11-14 NOTE — Progress Notes (Signed)
Patient ID: Tracey Wyatt, female   DOB: 05/26/1956, 64 y.o.   MRN: [id number]  Reason for Consult: Abnormal Pap Smear   Referred by No ref. provider found  Subjective:     HPI:  Tracey Wyatt is a 64 y.o. female . She is here today without complaints. She needs to repeat her pap smear. Insufficient cellularity with prior pap.   Past Medical History:  Diagnosis Date  . Arthritis   . Depression   . GERD (gastroesophageal reflux disease)   . Heart murmur   . History of hiatal hernia   . Hypertension   . Seizures ([place])    LAST ONE IN 2002 APPROXIMATELY   History reviewed. No pertinent family history. Past Surgical History:  Procedure Laterality Date  . CHOLECYSTECTOMY N/A 04/08/2016   Procedure: LAPAROSCOPIC CHOLECYSTECTOMY WITH INTRAOPERATIVE CHOLANGIOGRAM;  Surgeon: Ladora Daniel, MD;  Location: [facility];  Service: General;  Laterality: N/A;  . COLONOSCOPY    . COLONOSCOPY WITH PROPOFOL N/A 11/18/2016   Procedure: COLONOSCOPY WITH PROPOFOL;  Surgeon: Jonathon Bellows, MD;  Location: [facility];  Service: Endoscopy;  Laterality: N/A;  . ESOPHAGOGASTRODUODENOSCOPY N/A 04/08/2016   Procedure: ESOPHAGOGASTRODUODENOSCOPY (EGD);  Surgeon: Ladora Daniel, MD;  Location: [facility];  Service: General;  Laterality: N/A;  . FOOT SURGERY Bilateral   . LAPAROSCOPY N/A 04/08/2016   Procedure: LAPAROSCOPY DIAGNOSTIC;  Surgeon: Ladora Daniel, MD;  Location: [facility];  Service: General;  Laterality: N/A;    Short Social History:  Social History   Tobacco Use  . Smoking status: Former Smoker    Packs/day: 0.50    Years: 10.00    Pack years: 5.00    Types: Cigarettes    Quit date: 03/31/1996    Years since quitting: 23.6  . Smokeless tobacco: Never Used  Substance Use Topics  . Alcohol use: No    Allergies  Allergen Reactions  . Morphine Other (See Comments)    Other Reaction: Not Assessed  . Procaine Other (See Comments)    Current Outpatient Medications  Medication  Sig Dispense Refill  . alendronate (FOSAMAX) 70 MG tablet Take 70 mg by mouth once a week. Take with a full glass of water on an empty stomach EVERY WEDNESDAY    . aspirin 325 MG tablet Take 325 mg by mouth daily.    . benztropine (COGENTIN) 0.5 MG tablet Take 0.5 mg by mouth 2 (two) times daily.    . divalproex (DEPAKOTE) 250 MG DR tablet Take 250 mg by mouth 3 (three) times daily.    Marland Kitchen esomeprazole (NEXIUM) 40 MG capsule Take 40 mg by mouth 2 (two) times daily before a meal.     . ferrous sulfate (FER-IN-SOL) 75 (15 Fe) MG/ML SOLN Take by mouth.    . gabapentin (NEURONTIN) 300 MG capsule Take 300 mg by mouth 4 (four) times daily.    Marland Kitchen levETIRAcetam (KEPPRA) 1000 MG tablet Take 1,000 mg by mouth 2 (two) times daily.    Marland Kitchen lisinopril (PRINIVIL,ZESTRIL) 10 MG tablet Take 10 mg by mouth every morning.    . metoprolol tartrate (LOPRESSOR) 25 MG tablet Take 25 mg by mouth 2 (two) times daily.    . Multiple Vitamin (MULTIVITAMIN) tablet Take 1 tablet by mouth daily.    Marland Kitchen PARoxetine (PAXIL) 40 MG tablet Take 40 mg by mouth at bedtime.     . polyethylene glycol (MIRALAX / GLYCOLAX) packet Take 17 g by mouth daily.     No current facility-administered  medications for this visit.    Review of Systems  Constitutional: Negative for chills, fatigue, fever and unexpected weight change.  HENT: Negative for trouble swallowing.  Eyes: Negative for loss of vision.  Respiratory: Negative for cough, shortness of breath and wheezing.  Cardiovascular: Negative for chest pain, leg swelling, palpitations and syncope.  GI: Negative for abdominal pain, blood in stool, diarrhea, nausea and vomiting.  GU: Negative for difficulty urinating, dysuria, frequency and hematuria.  Musculoskeletal: Negative for back pain, leg pain and joint pain.  Skin: Negative for rash.  Neurological: Negative for dizziness, headaches, light-headedness, numbness and seizures.  Psychiatric: Negative for behavioral problem, confusion,  depressed mood and sleep disturbance.        Objective:  Objective   Vitals:   11/14/19 1054  BP: 120/80  Weight: 114 lb (51.7 kg)  Height: 5\' 4"  (1.626 m)   Body mass index is 19.57 kg/m.  Physical Exam Vitals and nursing note reviewed.  Constitutional:      Appearance: She is well-developed.  HENT:     Head: Normocephalic and atraumatic.  Eyes:     Pupils: Pupils are equal, round, and reactive to light.  Cardiovascular:     Rate and Rhythm: Normal rate and regular rhythm.  Pulmonary:     Effort: Pulmonary effort is normal. No respiratory distress.  Genitourinary:    Comments: External: Normal appearing vulva. No lesions noted.  Speculum examination: Normal appearing cervix. Small cervix. No blood in the vaginal vault. no discharge.   Bimanual examination: Uterus midline, non-tender, normal in size, shape and contour.  No CMT. No adnexal masses. No adnexal tenderness. Pelvis not fixed.    Skin:    General: Skin is warm and dry.  Neurological:     Mental Status: She is alert and oriented to person, place, and time.  Psychiatric:        Behavior: Behavior normal.        Thought Content: Thought content normal.        Judgment: Judgment normal.        Assessment/Plan:     64 yo with prior insufficient cellularity pap smear. Repeated pap smear today   More than 15 minutes were spent face to face with the patient in the room, reviewing the medical record, labs and images, and coordinating care for the patient. The plan of management was discussed in detail and counseling was provided.      Adrian Prows MD [facility] 11/14/2019 11:27 AM

## 2019-11-16 LAB — CYTOLOGY - PAP
Comment: NEGATIVE
Diagnosis: NEGATIVE
High risk HPV: NEGATIVE

## 2019-12-29 ENCOUNTER — Ambulatory Visit
Admission: RE | Admit: 2019-12-29 | Discharge: 2019-12-29 | Disposition: A | Discharge status: Home / Self Care | Payer: Medicare Other | Source: Ambulatory Visit | Attending: Internal Medicine | Admitting: Internal Medicine

## 2019-12-29 DIAGNOSIS — Z1231 Encounter for screening mammogram for malignant neoplasm of breast: Secondary | ICD-10-CM | POA: Diagnosis not present

## 2020-01-04 ENCOUNTER — Other Ambulatory Visit: Payer: Self-pay | Admitting: *Deleted

## 2020-01-04 ENCOUNTER — Inpatient Hospital Stay
Admission: RE | Admit: 2020-01-04 | Discharge: 2020-01-04 | Disposition: A | Discharge status: Home / Self Care | Payer: Self-pay | Source: Ambulatory Visit | Attending: *Deleted | Admitting: *Deleted

## 2020-01-04 DIAGNOSIS — Z1231 Encounter for screening mammogram for malignant neoplasm of breast: Secondary | ICD-10-CM

## 2020-10-21 IMAGING — MG DIGITAL SCREENING BILAT W/ TOMO W/ CAD
6 of 10 series · 6 of 30 positions shown · non-contrast
Comparison: Previous exam(s).

CLINICAL DATA: Screening.

EXAM:
DIGITAL SCREENING BILATERAL MAMMOGRAM WITH TOMO AND CAD

[L MLO synth-2D]
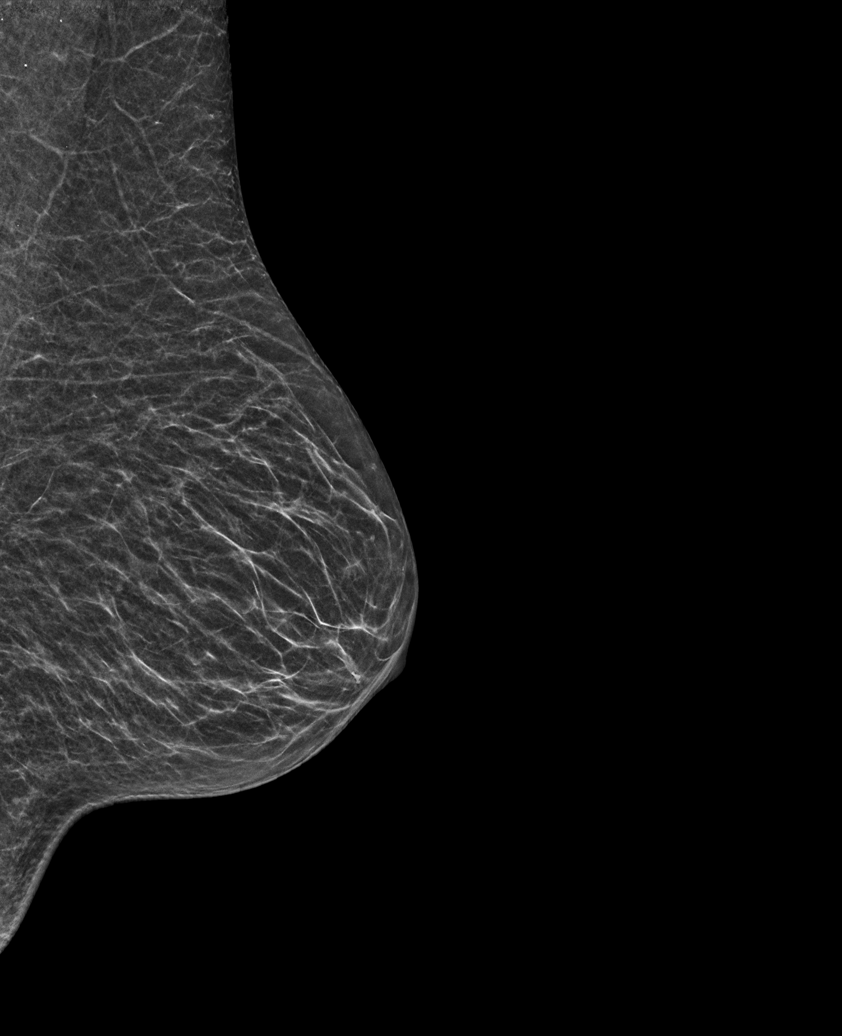

[R MLO synth-2D (1 of 2)]
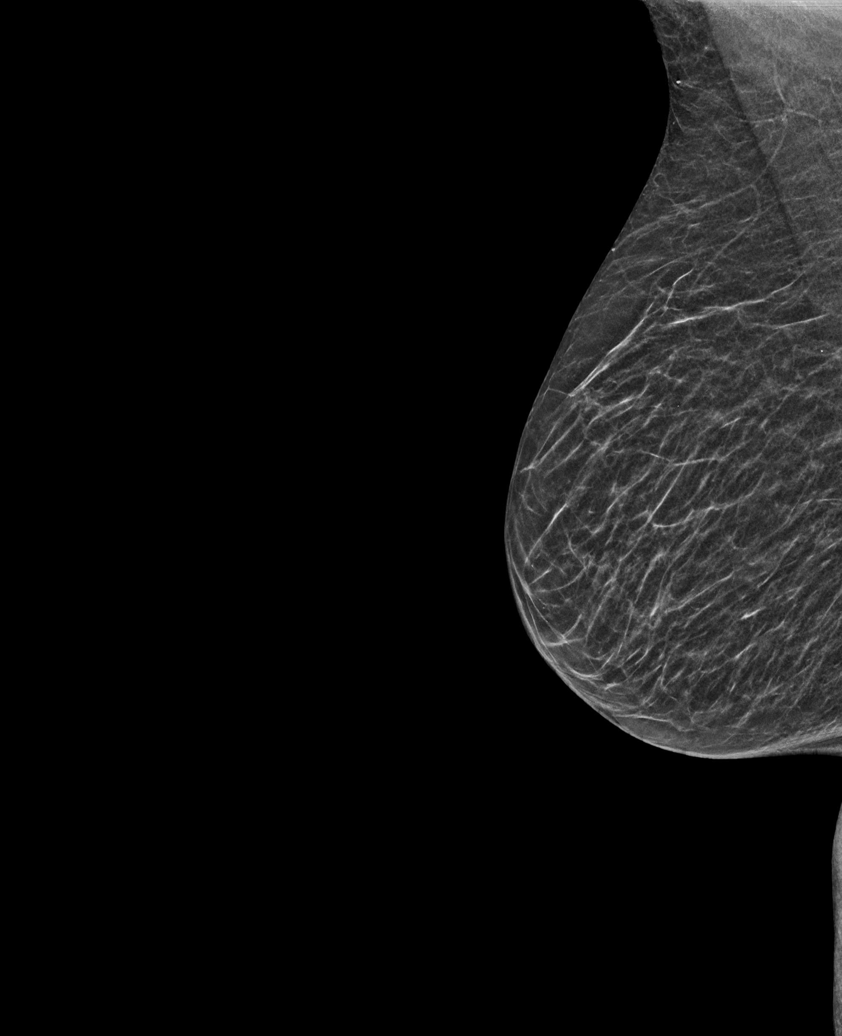

[L CC synth-2D]
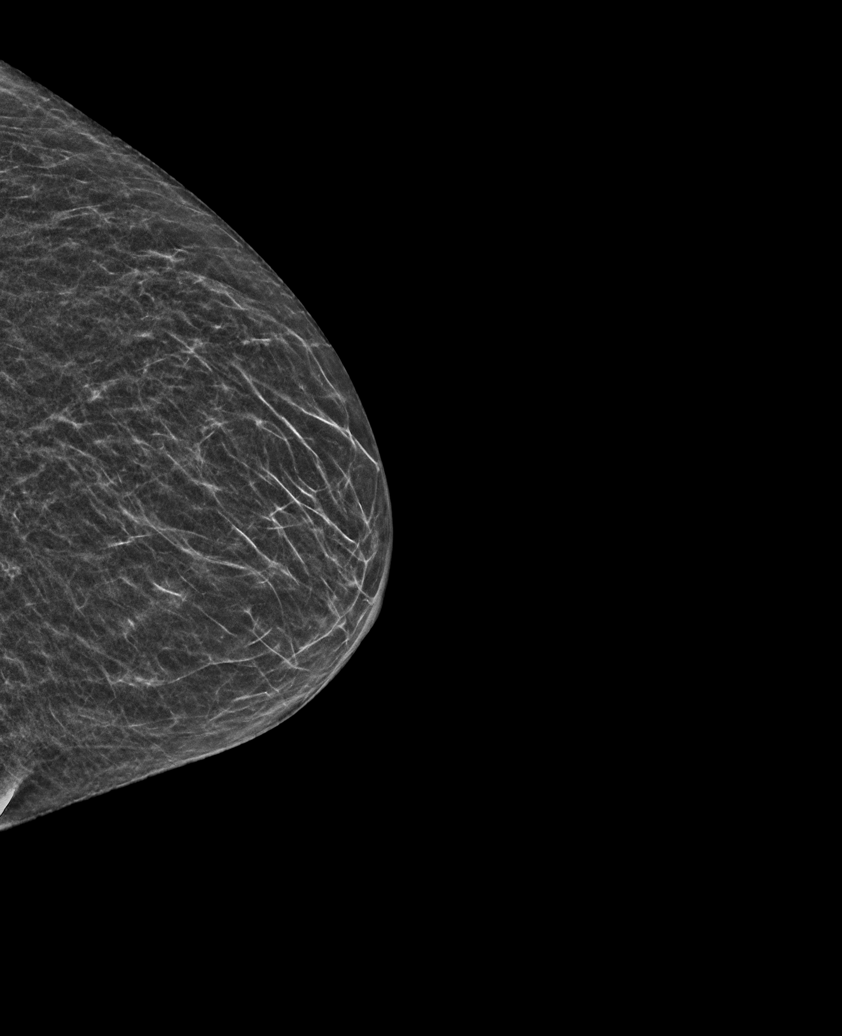

[R MLO synth-2D (2 of 2)]
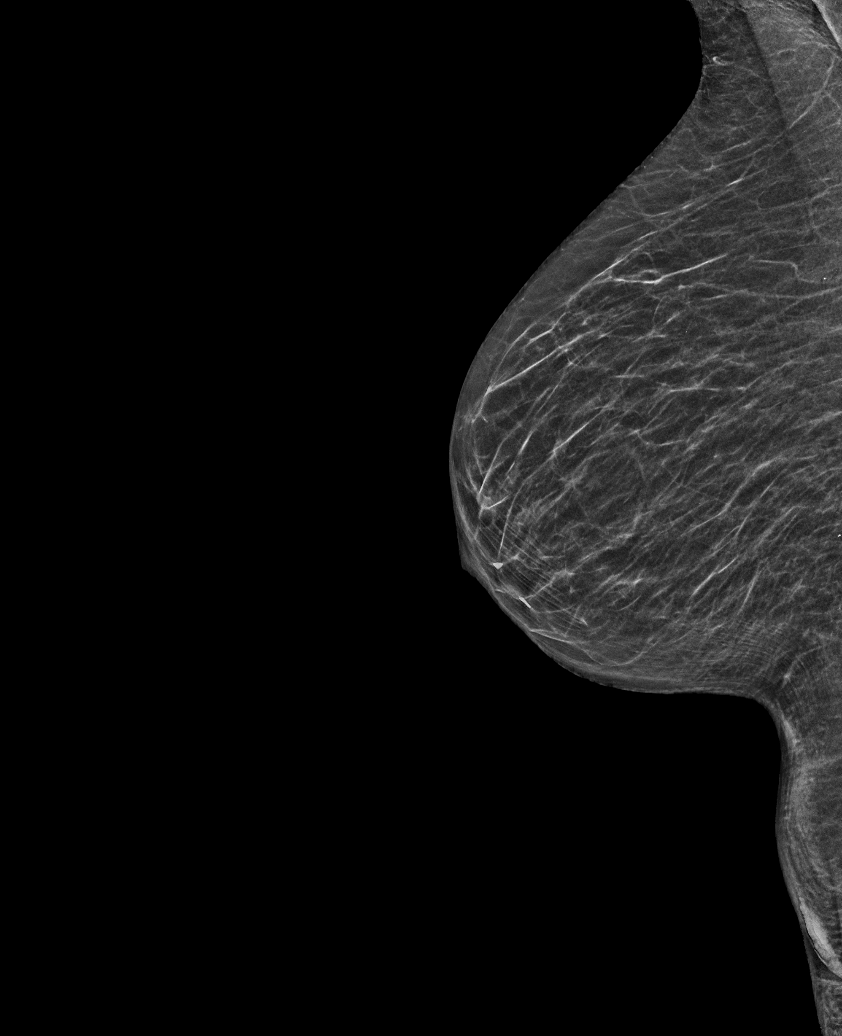

[R CC synth-2D]
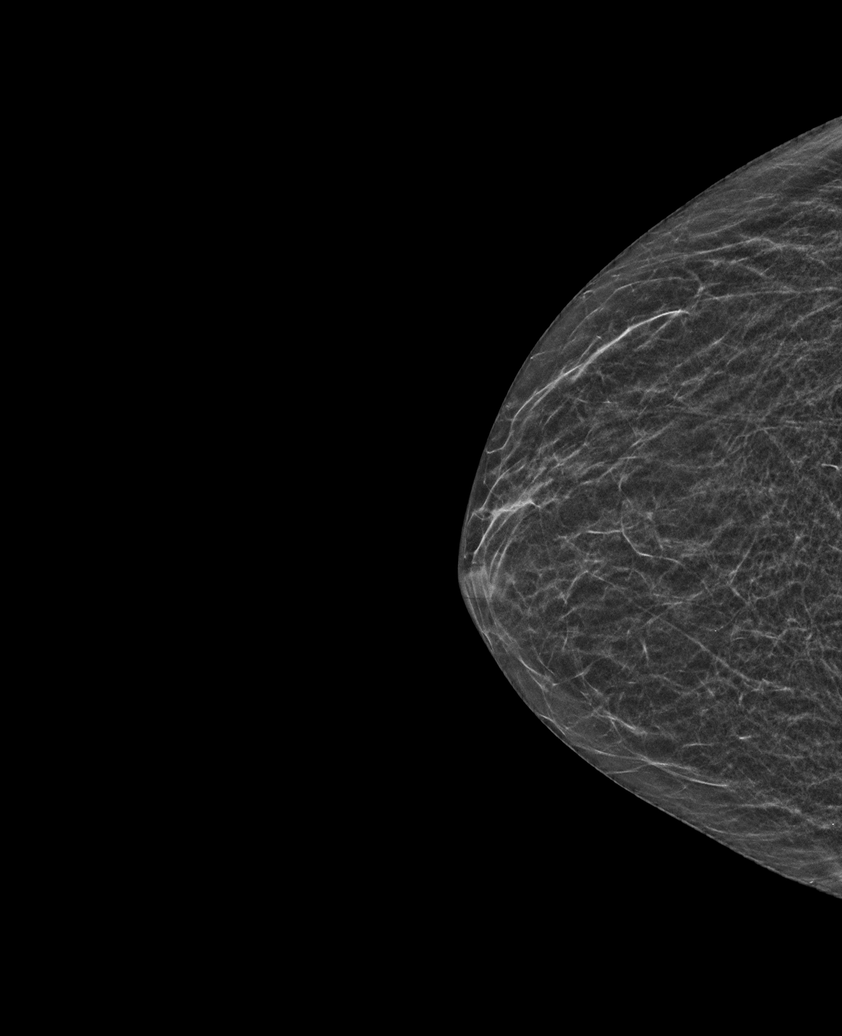

[R CC tomo · tomo slice 18/35.0]
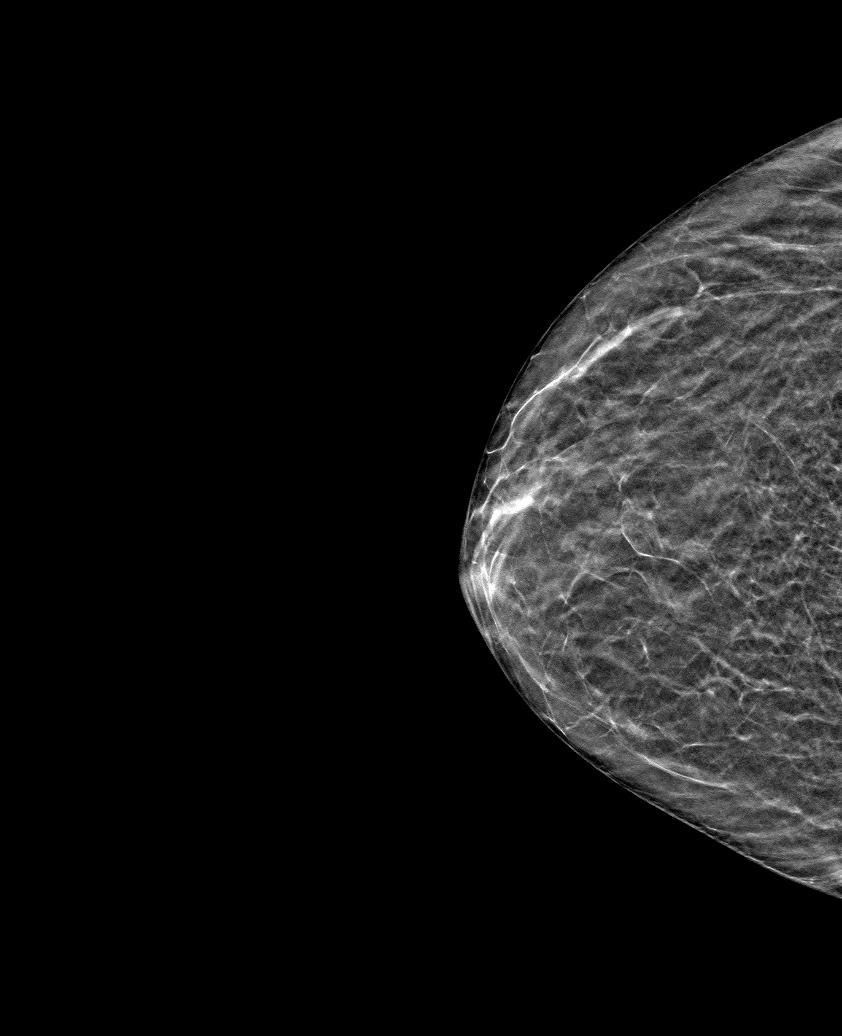

[6 of 30 positions shown; findings below may reference images not displayed]

ACR Breast Density Category b: There are scattered areas of
fibroglandular density.
FINDINGS: There are no findings suspicious for malignancy. Images were
processed with CAD.
IMPRESSION: No mammographic evidence of malignancy. A result letter of this
screening mammogram will be mailed directly to the patient.

RECOMMENDATION:
Screening mammogram in one year. (Code:[id number])

BI-RADS CATEGORY  1: Negative.

## 2021-01-31 ENCOUNTER — Other Ambulatory Visit: Payer: Self-pay | Admitting: Cardiovascular Disease

## 2022-12-23 ENCOUNTER — Other Ambulatory Visit: Payer: Self-pay

## 2022-12-23 DIAGNOSIS — S01111A Laceration without foreign body of right eyelid and periocular area, initial encounter: Secondary | ICD-10-CM | POA: Insufficient documentation

## 2022-12-23 DIAGNOSIS — M545 Low back pain, unspecified: Secondary | ICD-10-CM | POA: Insufficient documentation

## 2022-12-23 DIAGNOSIS — I10 Essential (primary) hypertension: Secondary | ICD-10-CM | POA: Insufficient documentation

## 2022-12-23 DIAGNOSIS — Y92009 Unspecified place in unspecified non-institutional (private) residence as the place of occurrence of the external cause: Secondary | ICD-10-CM | POA: Diagnosis not present

## 2022-12-23 DIAGNOSIS — R519 Headache, unspecified: Secondary | ICD-10-CM | POA: Diagnosis not present

## 2022-12-23 DIAGNOSIS — M542 Cervicalgia: Secondary | ICD-10-CM | POA: Insufficient documentation

## 2022-12-23 DIAGNOSIS — W01198A Fall on same level from slipping, tripping and stumbling with subsequent striking against other object, initial encounter: Secondary | ICD-10-CM | POA: Diagnosis not present

## 2022-12-23 NOTE — ED Triage Notes (Signed)
Pt presents to ER via ems from [facility] assisted [facility] following a fall while she was going to the bathroom. Pt reportedly feel forward, and hit head on the door frame.  Pt has appx 1in lac to head above right eyebrow with bleeding controlled.  Pt is not on any blood thinners, and denies LOC.  Pt is otherwise alert and in NAD on arrival.

## 2022-12-24 ENCOUNTER — Emergency Department: Payer: Medicare Other

## 2022-12-24 ENCOUNTER — Emergency Department
Admission: EM | Admit: 2022-12-24 | Discharge: 2022-12-24 | Disposition: A | Discharge status: Other Acute Inpatient Hospital | Payer: Medicare Other | Attending: Emergency Medicine | Admitting: Emergency Medicine

## 2022-12-24 DIAGNOSIS — S0181XA Laceration without foreign body of other part of head, initial encounter: Secondary | ICD-10-CM

## 2022-12-24 DIAGNOSIS — S0990XA Unspecified injury of head, initial encounter: Secondary | ICD-10-CM

## 2022-12-24 DIAGNOSIS — S01111A Laceration without foreign body of right eyelid and periocular area, initial encounter: Secondary | ICD-10-CM | POA: Diagnosis not present

## 2022-12-24 MED ORDER — LIDOCAINE HCL (PF) 1 % IJ SOLN
5.0000 mL | Freq: Once | INTRAMUSCULAR | Status: AC
  Administered 2022-12-24: 5 mL via INTRADERMAL
  Filled 2022-12-24: qty 5

## 2022-12-24 NOTE — ED Provider Notes (Signed)
[facility] Provider Note    Event Date/Time   First MD Initiated Contact with Patient 12/24/22 0031     (approximate)   History   Head Laceration   HPI  Tracey Wyatt is a 67 y.o. female with a history of hypertension, epilepsy, and GERD who presents from assisted living with a fall laceration after a fall.  The patient states that she lost her balance and fell while she was trying to go to the bathroom, hitting her head on the door frame.  The patient reports some headache and pain to her neck and back.  She has a small laceration near the right eyebrow.  She denies other injuries.  She did not lose consciousness.  She has had no vomiting.  I reviewed the past medical records.  The patient was seen by Dr. Darrold Junker from cardiology on 4/23 for follow-up of her hypertension and palpitations.   Physical Exam   Triage Vital Signs: ED Triage Vitals  Enc Vitals Group     BP 12/23/22 2343 96/82     Pulse Rate 12/23/22 2343 72     Resp 12/23/22 2343 17     Temp 12/23/22 2343 (!) 96.3 F (35.7 C)     Temp Source 12/23/22 2343 Axillary     SpO2 12/23/22 2343 97 %     Weight 12/23/22 2344 115 lb (52.2 kg)     Height 12/23/22 2344 5\' 4"  (1.626 m)     Head Circumference --      Peak Flow --      Pain Score 12/23/22 2344 3     Pain Loc --      Pain Edu? --      Excl. in GC? --     Most recent vital signs: Vitals:   12/24/22 0130 12/24/22 0200  BP: 114/76 133/71  Pulse: 66 73  Resp:    Temp:    SpO2: 100% 98%     General: Alert, comfortable appearing, no distress.  CV:  Good peripheral perfusion.  Resp:  Normal effort.  Abd:  No distention.  Other:  5/5 motor strength and intact sensation to all extremities.  2 cm superficial laceration superior/lateral to the right eyebrow.  Mild midline cervical, thoracic, and lumbar spinal tenderness with no step-off or crepitus.   ED Results / Procedures / Treatments   Labs (all labs ordered are  listed, but only abnormal results are displayed) Labs Reviewed - No data to display   EKG     RADIOLOGY  CT head: I independently viewed and interpreted the images; there is no ICH. CT cervical spine: No acute fracture CT thoracic spine: No acute fracture CT lumbar spine: No acute fracture   PROCEDURES:  Critical Care performed: No  ..Laceration Repair  Date/Time: 12/24/2022 1:12 AM  Performed by: Dionne Bucy, MD Authorized by: Dionne Bucy, MD   Consent:    Consent obtained:  Verbal   Consent given by:  Patient Universal protocol:    Patient identity confirmed:  Verbally with patient Anesthesia:    Anesthesia method:  Local infiltration   Local anesthetic:  Lidocaine 1% w/o epi Laceration details:    Location:  Face   Face location:  R eyebrow   Length (cm):  2   Depth (mm):  3 Exploration:    Wound exploration: entire depth of wound visualized     Contaminated: no   Treatment:    Area cleansed with:  Povidone-iodine   Amount  of cleaning:  Standard   Irrigation solution:  Sterile saline   Irrigation method:  Syringe   Debridement:  None Skin repair:    Repair method:  Sutures   Suture size:  5-0   Suture material:  Nylon   Suture technique:  Simple interrupted   Number of sutures:  3 Approximation:    Approximation:  Close Repair type:    Repair type:  Simple Post-procedure details:    Dressing:  Sterile dressing   Procedure completion:  Tolerated well, no immediate complications    MEDICATIONS ORDERED IN ED: Medications  lidocaine (PF) (XYLOCAINE) 1 % injection 5 mL (5 mLs Intradermal Given 12/24/22 0103)     IMPRESSION / MDM / ASSESSMENT AND PLAN / ED COURSE  I reviewed the triage vital signs and the nursing notes.  67 year old female with PMH as noted above presents after mechanical fall from standing height, hitting her face on a door.  The patient no LOC.  She has a small laceration to the right face.  She also has mild  tenderness in the cervical, lumbar, and thoracic spine.  Neurologic exam is normal.  Differential diagnosis includes, but is not limited to:  Head injury: Minor head injury, concussion, less likely ICH. Neck and back pain: Contusion, fracture  Patient's presentation is most consistent with acute presentation with potential threat to life or bodily function.  CT head and cervical spine are negative for acute findings.  The laceration was repaired successfully.  I have sent the patient back for CT of the lumbar and thoracic spine based on my examination.  ----------------------------------------- 2:48 AM on 12/24/2022 -----------------------------------------  Additional CTs are negative.  There is a wedge deformity at T12 with the patient has no focal tenderness here and overall presentation is not consistent with acute fracture.  At this time, the patient is stable for discharge home.  I counseled her on the results of the imaging and plan of care including return for suture removal.  I gave her strict return precautions and she expressed understanding.   FINAL CLINICAL IMPRESSION(S) / ED DIAGNOSES   Final diagnoses:  Facial laceration, initial encounter  Minor head injury, initial encounter     Rx / DC Orders   ED Discharge Orders     None        Note:  This document was prepared using Dragon voice recognition software and may include unintentional dictation errors.    Dionne Bucy, MD 12/24/22 [phone]

## 2022-12-24 NOTE — Discharge Instructions (Signed)
Return in 5 days to have the sutures removed.  Return immediately for new, worsening, persistent severe headache, dizziness, vomiting, weakness, or any other new or worsening symptoms that concern you.

## 2022-12-24 NOTE — ED Notes (Signed)
called to [facility] for transport to facility/ [facility]/rep:richard.

## 2022-12-24 NOTE — ED Notes (Signed)
This RN attempted to call [facility], no call went through.

## 2023-07-07 ENCOUNTER — Other Ambulatory Visit: Payer: Self-pay

## 2023-07-07 ENCOUNTER — Emergency Department: Payer: Medicare Other

## 2023-07-07 ENCOUNTER — Emergency Department
Admission: EM | Admit: 2023-07-07 | Discharge: 2023-07-07 | Disposition: A | Discharge status: Skilled Nursing Facility | Payer: Medicare Other | Attending: Emergency Medicine | Admitting: Emergency Medicine

## 2023-07-07 DIAGNOSIS — S0990XA Unspecified injury of head, initial encounter: Secondary | ICD-10-CM

## 2023-07-07 DIAGNOSIS — S0181XA Laceration without foreign body of other part of head, initial encounter: Secondary | ICD-10-CM | POA: Diagnosis not present

## 2023-07-07 DIAGNOSIS — W01198A Fall on same level from slipping, tripping and stumbling with subsequent striking against other object, initial encounter: Secondary | ICD-10-CM | POA: Insufficient documentation

## 2023-07-07 DIAGNOSIS — I10 Essential (primary) hypertension: Secondary | ICD-10-CM | POA: Insufficient documentation

## 2023-07-07 NOTE — ED Provider Notes (Signed)
[facility] Provider Note    Event Date/Time   First MD Initiated Contact with Patient 07/07/23 1329     (approximate)   History   Fall   HPI  Tracey Wyatt is a 67 year old female with history of hypertension, dystonia, seizures presenting to the emergency department for evaluation after a fall.  Patient was in her closet when she slipped on the floor and fell and hit her head.  Denies preceding chest pain, shortness of breath, lightheadedness.  Denies syncope.  Does have a hematoma with small laceration over her forehead.  Denies injury to other areas.  Brother reports that she has not been using her walker to ambulate as directed.    Physical Exam   Triage Vital Signs: ED Triage Vitals  Encounter Vitals Group     BP 07/07/23 1113 (!) 115/102     Systolic BP Percentile --      Diastolic BP Percentile --      Pulse Rate 07/07/23 1113 99     Resp 07/07/23 1113 17     Temp 07/07/23 1113 98.1 F (36.7 C)     Temp Source 07/07/23 1113 Oral     SpO2 07/07/23 1113 91 %     Weight 07/07/23 1118 100 lb (45.4 kg)     Height 07/07/23 1118 5\' 4"  (1.626 m)     Head Circumference --      Peak Flow --      Pain Score 07/07/23 1118 2     Pain Loc --      Pain Education --      Exclude from Growth Chart --     Most recent vital signs: Vitals:   07/07/23 1113  BP: (!) 115/102  Pulse: 99  Resp: 17  Temp: 98.1 F (36.7 C)  SpO2: 91%   Nursing notes and vital signs reviewed.  General: Adult female, lying in bed, awake, interactive, frequent Head: Ecchymosis and swelling above the left eyebrow with 1 cm laceration with minimal amount of associated gaping Chest: Symmetric chest rise, no tenderness to palpation.  Cardiac: Regular rhythm and rate.  Respiratory: Lungs clear to auscultation Abdomen: Soft, nondistended. No tenderness to palpation.  Pelvis: Stable in AP and lateral compression. No tenderness to palpation. MSK: No deformity to bilateral  upper and lower extremity. Full range of motion to bilateral upper lower extremity with no pain. Neuro: Alert, oriented. GCS 15. Normal sensation to light touch in bilateral upper and lower extremity. Skin: No evidence of burns   ED Results / Procedures / Treatments   Labs (all labs ordered are listed, but only abnormal results are displayed) Labs Reviewed - No data to display   EKG EKG independently reviewed interpreted by myself (ER attending) demonstrates:    RADIOLOGY Imaging independently reviewed and interpreted by myself demonstrates:  CT head without acute bleed  CT C-spine without obvious acute fracture on my review, radiology notes limitations due to motion, but no visible fracture  PROCEDURES:  Critical Care performed: No  .Laceration Repair  Date/Time: 07/07/2023 2:10 PM  Performed by: Trinna Post, MD Authorized by: Trinna Post, MD   Consent:    Consent obtained:  Verbal Anesthesia:    Anesthesia method:  None Laceration details:    Location:  Face   Face location:  Forehead   Length (cm):  1 Treatment:    Irrigation solution:  Sterile saline Skin repair:    Repair method:  Tissue adhesive Repair type:  Repair type:  Simple    MEDICATIONS ORDERED IN ED: Medications - No data to display   IMPRESSION / MDM / ASSESSMENT AND PLAN / ED COURSE  I reviewed the triage vital signs and the nursing notes.  Differential diagnosis includes, but is not limited to, intracranial bleed, skull fracture, no evidence of thoracoabdominal or spine fracture  Patient's presentation is most consistent with acute presentation with potential threat to life or bodily function.  67 year old female presenting to the Emergency Department for evaluation after a fall.  Signs of head trauma on exam, but patient overall well-appearing.  CT head and C-spine ordered.  No obvious trauma on my review, formal radiology read pending.  Small laceration repaired with skin glue over forehead.   Patient is comfortable with discharge home.  If radiology reads are reassuring, anticipate discharge with strict return precautions.  Radiology read without acute intracranial process, they do note limitations of cervical spine CT, but no visible fracture.  Patient without any neck pain on exam.  Do think she is stable for discharge.  Strict return precautions provided.      FINAL CLINICAL IMPRESSION(S) / ED DIAGNOSES   Final diagnoses:  Injury of head, initial encounter  Facial laceration, initial encounter     Rx / DC Orders   ED Discharge Orders     None        Note:  This document was prepared using Dragon voice recognition software and may include unintentional dictation errors.   Trinna Post, MD 07/07/23 [phone]

## 2023-07-07 NOTE — Discharge Instructions (Signed)
Your testing today was reassuring.  Please make sure you using your walker and other assist devices as directed.  Return to the ER for new or worsening symptoms.

## 2023-07-07 NOTE — ED Triage Notes (Signed)
Pt comes via EMs from [facility] with c/o trip and fall. Pt states she was cleaning out her closet and slipped and fell. Pt did hit her head no loc or thinners.  Pt has hematoma over left eyebrow.  Pt does have dystonia and on meds for  that.   VSS

## 2023-07-24 ENCOUNTER — Emergency Department: Payer: Medicare Other

## 2023-07-24 ENCOUNTER — Encounter: Payer: Self-pay | Admitting: Intensive Care

## 2023-07-24 ENCOUNTER — Emergency Department
Admission: EM | Admit: 2023-07-24 | Discharge: 2023-07-24 | Disposition: A | Discharge status: Home / Self Care | Payer: Medicare Other | Attending: Emergency Medicine | Admitting: Emergency Medicine

## 2023-07-24 ENCOUNTER — Other Ambulatory Visit: Payer: Self-pay

## 2023-07-24 DIAGNOSIS — R404 Transient alteration of awareness: Secondary | ICD-10-CM | POA: Insufficient documentation

## 2023-07-24 DIAGNOSIS — N3000 Acute cystitis without hematuria: Secondary | ICD-10-CM | POA: Diagnosis not present

## 2023-07-24 DIAGNOSIS — R4182 Altered mental status, unspecified: Secondary | ICD-10-CM | POA: Diagnosis present

## 2023-07-24 HISTORY — DX: Other psychoactive substance abuse, uncomplicated: F19.10

## 2023-07-24 HISTORY — DX: Pyromania: F63.1

## 2023-07-24 LAB — COMPREHENSIVE METABOLIC PANEL
ALT: 15 U/L (ref 0–44)
AST: 20 U/L (ref 15–41)
Albumin: 3.5 g/dL (ref 3.5–5.0)
Alkaline Phosphatase: 35 U/L — ABNORMAL LOW (ref 38–126)
Anion gap: 9 (ref 5–15)
BUN: 21 mg/dL (ref 8–23)
CO2: 20 mmol/L — ABNORMAL LOW (ref 22–32)
Calcium: 8.6 mg/dL — ABNORMAL LOW (ref 8.9–10.3)
Chloride: 104 mmol/L (ref 98–111)
Creatinine, Ser: 0.64 mg/dL (ref 0.44–1.00)
GFR, Estimated: >60 mL/min (ref >60)
Glucose, Bld: 85 mg/dL (ref 70–99)
Potassium: 4.2 mmol/L (ref 3.5–5.1)
Sodium: 133 mmol/L — ABNORMAL LOW (ref 135–145)
Total Bilirubin: 0.6 mg/dL (ref 0.0–1.2)
Total Protein: 5.7 g/dL — ABNORMAL LOW (ref 6.5–8.1)

## 2023-07-24 LAB — CBC WITH DIFFERENTIAL/PLATELET
Abs Immature Granulocytes: 0.02 10*3/uL (ref 0.00–0.07)
Basophils Absolute: 0 10*3/uL (ref 0.0–0.1)
Basophils Relative: 1 %
Eosinophils Absolute: 0 10*3/uL (ref 0.0–0.5)
Eosinophils Relative: 1 %
HCT: 34.4 % — ABNORMAL LOW (ref 36.0–46.0)
Hemoglobin: 11.9 g/dL — ABNORMAL LOW (ref 12.0–15.0)
Immature Granulocytes: 0 %
Lymphocytes Relative: 17 %
Lymphs Abs: 1.5 10*3/uL (ref 0.7–4.0)
MCH: 31.2 pg (ref 26.0–34.0)
MCHC: 34.6 g/dL (ref 30.0–36.0)
MCV: 90.1 fL (ref 80.0–100.0)
Monocytes Absolute: 0.6 10*3/uL (ref 0.1–1.0)
Monocytes Relative: 7 %
Neutro Abs: 6.7 10*3/uL (ref 1.7–7.7)
Neutrophils Relative %: 74 %
Platelets: 252 10*3/uL (ref 150–400)
RBC: 3.82 MIL/uL — ABNORMAL LOW (ref 3.87–5.11)
RDW: 11 % — ABNORMAL LOW (ref 11.5–15.5)
WBC: 8.9 10*3/uL (ref 4.0–10.5)
nRBC: 0 % (ref 0.0–0.2)

## 2023-07-24 LAB — TROPONIN I (HIGH SENSITIVITY): Troponin I (High Sensitivity): 5 ng/L (ref <18)

## 2023-07-24 LAB — URINALYSIS, ROUTINE W REFLEX MICROSCOPIC
Bilirubin Urine: NEGATIVE
Glucose, UA: NEGATIVE mg/dL
Ketones, ur: NEGATIVE mg/dL
Nitrite: NEGATIVE
Protein, ur: NEGATIVE mg/dL
Specific Gravity, Urine: 1.017 (ref 1.005–1.030)
WBC, UA: >50 WBC/hpf (ref 0–5)
pH: 5 (ref 5.0–8.0)

## 2023-07-24 MED ORDER — CEPHALEXIN 500 MG PO CAPS: 500.0000 mg | ORAL_CAPSULE | Freq: Three times a day (TID) | ORAL | 0 refills | Status: AC

## 2023-07-24 MED ORDER — SODIUM CHLORIDE 0.9 % IV SOLN
2.0000 g | Freq: Once | INTRAVENOUS | Status: AC
  Administered 2023-07-24: 2 g via INTRAVENOUS
  Filled 2023-07-24: qty 20

## 2023-07-24 NOTE — ED Provider Notes (Signed)
 [facility] Provider Note    Event Date/Time   First MD Initiated Contact with Patient 07/24/23 1650     (approximate)   History   Altered Mental Status   HPI  Tracey Wyatt is a 68 y.o. female with past medical history of schizophrenia, prior substance use, seizures, here with reported altered mental status.  Per report, the patient has been slightly more fatigued than usual and has not been eating and drinking much.  She was finger hit up against the wall last night, which she has done when she is agitated.  She denies any major complaints on my assessment.  Family states that she is at baseline.  She has a history of schizophrenia.  He feels that her medications have not this really been working very well recently, that this has been ongoing for several months.  She reports to me some mild suprapubic tenderness.  No CVA tenderness.  No nausea or vomiting.  No fevers.     Physical Exam   Triage Vital Signs: ED Triage Vitals  Encounter Vitals Group     BP 07/24/23 1115 98/80     Systolic BP Percentile --      Diastolic BP Percentile --      Pulse Rate 07/24/23 1115 79     Resp 07/24/23 1115 17     Temp 07/24/23 1115 98.3 F (36.8 C)     Temp Source 07/24/23 1115 Oral     SpO2 07/24/23 1115 100 %     Weight 07/24/23 1111 104 lb (47.2 kg)     Height 07/24/23 1111 5' 6 (1.676 m)     Head Circumference --      Peak Flow --      Pain Score 07/24/23 1111 0     Pain Loc --      Pain Education --      Exclude from Growth Chart --     Most recent vital signs: Vitals:   07/24/23 1636 07/24/23 1828  BP:  (!) 128/95  Pulse:  74  Resp:  18  Temp:  (!) 97.4 F (36.3 C)  SpO2: 100% 100%     General: Awake, no distress.  Alert, oriented x 3. CV:  Good peripheral perfusion.  Regular rate and rhythm. Resp:  Normal work of breathing.  Lungs clear to auscultation bilaterally. Abd:  No distention.  Mild suprapubic tenderness.  No guarding or  rebound. Other:  Alert, oriented x 3.  No focal neurological deficits.  Strength out of 5 upper and lower extremities.  Normal sensation light touch.   ED Results / Procedures / Treatments   Labs (all labs ordered are listed, but only abnormal results are displayed) Labs Reviewed  COMPREHENSIVE METABOLIC PANEL - Abnormal; Notable for the following components:      Result Value   Sodium 133 (*)    CO2 20 (*)    Calcium 8.6 (*)    Total Protein 5.7 (*)    Alkaline Phosphatase 35 (*)    All other components within normal limits  CBC WITH DIFFERENTIAL/PLATELET - Abnormal; Notable for the following components:   RBC 3.82 (*)    Hemoglobin 11.9 (*)    HCT 34.4 (*)    RDW 11.0 (*)    All other components within normal limits  URINALYSIS, ROUTINE W REFLEX MICROSCOPIC - Abnormal; Notable for the following components:   Color, Urine YELLOW (*)    APPearance CLOUDY (*)    Hgb  urine dipstick MODERATE (*)    Leukocytes,Ua LARGE (*)    Bacteria, UA MANY (*)    All other components within normal limits  URINE CULTURE  TROPONIN I (HIGH SENSITIVITY)     EKG Normal sinus rhythm, ventricular rate 77.  PR 150, QRS 132, QTc 486.  No acute ST elevations or depressions or negatives of acute ischemia or infarct.  Left bundle branch block.   RADIOLOGY CT head: No acute intracranial malady CT C-spine: No acute abnormality Chest x-ray: No acute fractures    I also independently reviewed and agree with radiologist interpretations.   PROCEDURES:  Critical Care performed: No   MEDICATIONS ORDERED IN ED: Medications  cefTRIAXone  (ROCEPHIN ) 2 g in sodium chloride  0.9 % 100 mL IVPB (0 g Intravenous Stopped 07/24/23 1852)     IMPRESSION / MDM / ASSESSMENT AND PLAN / ED COURSE  I reviewed the triage vital signs and the nursing notes.                              Differential diagnosis includes, but is not limited to, UTI, metabolic encephalopathy, polypharmacy, stroke, polypharmacy,  behavioral history  Patient's presentation is most consistent with acute presentation with potential threat to life or bodily function.  The patient is on the cardiac monitor to evaluate for evidence of arrhythmia and/or significant heart rate changes   68 year old female with past medical history as above here with reported mild decreased activity and appetite.  Patient appears clinically very well.  She does not appear septic.  CBC without leukocytosis.  Vital signs are stable.  She has no focal neurological deficits.  CMP unremarkable.  Patient does have what appears to be UTI.  No CVA tenderness.  She is eating and drinking normally.  Patient given a dose of Rocephin .  CT head and C-spine are negative.  Family confirms she is otherwise at her baseline.  She is in a facility.  Will discharge on outpatient antibiotics with good return precautions.   FINAL CLINICAL IMPRESSION(S) / ED DIAGNOSES   Final diagnoses:  Transient alteration of awareness  Acute cystitis without hematuria     Rx / DC Orders   ED Discharge Orders          Ordered    cephALEXin  (KEFLEX ) 500 MG capsule  3 times daily        07/24/23 1946             Note:  This document was prepared using Dragon voice recognition software and may include unintentional dictation errors.   Angelena Smalls, MD 07/24/23 2233

## 2023-07-24 NOTE — ED Notes (Signed)
 Pt d/c instructions reviewed w/ pt family member, prescriptions discussed. Pt in NAD upon departure from the ED.

## 2023-07-24 NOTE — ED Notes (Signed)
 Patient transported to CT

## 2023-07-24 NOTE — ED Triage Notes (Signed)
 Pt comes via EMS from [facility] with not feeling well. Pt hasn't ate much. Pt hasn't been sleeping. Pt was up last night banging her head against the wall.  BP-90/56 CBG 99 98% RA  HR-81

## 2023-07-24 NOTE — ED Provider Triage Note (Signed)
 Emergency Medicine Provider Triage Evaluation Note  Tracey Wyatt , a 68 y.o. female  was evaluated in triage.  Pt denies complaint. Sent from [facility] for agitation, banging her head against the wall last night, not eating or sleeping well.  Physical Exam  BP 98/80 (BP Location: Left Arm)   Pulse 79   Temp 98.3 F (36.8 C) (Oral)   Resp 17   Ht 5' 6 (1.676 m)   Wt 47.2 kg   SpO2 100%   BMI 16.79 kg/m  Gen:   Awake, no distress   Resp:  Normal effort  MSK:   Moves extremities without difficulty  Other:    Medical Decision Making  Medically screening exam initiated at 11:19 AM.  Appropriate orders placed.  Tracey Wyatt was informed that the remainder of the evaluation will be completed by another provider, this initial triage assessment does not replace that evaluation, and the importance of remaining in the ED until their evaluation is complete.  Altered mental status workup started.   Tracey Kirk NOVAK, FNP 07/24/23 1121

## 2023-07-27 LAB — URINE CULTURE: Culture: >=100000 — AB

## 2023-08-24 ENCOUNTER — Emergency Department
Admission: EM | Admit: 2023-08-24 | Discharge: 2023-08-25 | Disposition: A | Discharge status: Home / Self Care | Payer: Medicare Other | Source: Home / Self Care | Attending: Emergency Medicine | Admitting: Emergency Medicine

## 2023-08-24 ENCOUNTER — Other Ambulatory Visit: Payer: Self-pay

## 2023-08-24 ENCOUNTER — Emergency Department: Payer: Medicare Other

## 2023-08-24 ENCOUNTER — Encounter: Payer: Self-pay | Admitting: Emergency Medicine

## 2023-08-24 DIAGNOSIS — R4182 Altered mental status, unspecified: Secondary | ICD-10-CM | POA: Insufficient documentation

## 2023-08-24 DIAGNOSIS — G9341 Metabolic encephalopathy: Secondary | ICD-10-CM | POA: Diagnosis not present

## 2023-08-24 DIAGNOSIS — I10 Essential (primary) hypertension: Secondary | ICD-10-CM | POA: Insufficient documentation

## 2023-08-24 DIAGNOSIS — Z20822 Contact with and (suspected) exposure to covid-19: Secondary | ICD-10-CM | POA: Insufficient documentation

## 2023-08-24 LAB — URINALYSIS, ROUTINE W REFLEX MICROSCOPIC
Bacteria, UA: NONE SEEN
Bilirubin Urine: NEGATIVE
Glucose, UA: NEGATIVE mg/dL
Hgb urine dipstick: NEGATIVE
Ketones, ur: 5 mg/dL — AB
Leukocytes,Ua: NEGATIVE
Nitrite: NEGATIVE
Protein, ur: 30 mg/dL — AB
Specific Gravity, Urine: 1.03 (ref 1.005–1.030)
pH: 5 (ref 5.0–8.0)

## 2023-08-24 LAB — CBC WITH DIFFERENTIAL/PLATELET
Abs Immature Granulocytes: 0.03 10*3/uL (ref 0.00–0.07)
Basophils Absolute: 0 10*3/uL (ref 0.0–0.1)
Basophils Relative: 0 %
Eosinophils Absolute: 0 10*3/uL (ref 0.0–0.5)
Eosinophils Relative: 0 %
HCT: 27.9 % — ABNORMAL LOW (ref 36.0–46.0)
Hemoglobin: 9.5 g/dL — ABNORMAL LOW (ref 12.0–15.0)
Immature Granulocytes: 0 %
Lymphocytes Relative: 16 %
Lymphs Abs: 1.4 10*3/uL (ref 0.7–4.0)
MCH: 30.3 pg (ref 26.0–34.0)
MCHC: 34.1 g/dL (ref 30.0–36.0)
MCV: 88.9 fL (ref 80.0–100.0)
Monocytes Absolute: 1.2 10*3/uL — ABNORMAL HIGH (ref 0.1–1.0)
Monocytes Relative: 14 %
Neutro Abs: 6 10*3/uL (ref 1.7–7.7)
Neutrophils Relative %: 70 %
Platelets: 189 10*3/uL (ref 150–400)
RBC: 3.14 MIL/uL — ABNORMAL LOW (ref 3.87–5.11)
RDW: 11.9 % (ref 11.5–15.5)
WBC: 8.7 10*3/uL (ref 4.0–10.5)
nRBC: 0 % (ref 0.0–0.2)

## 2023-08-24 LAB — BASIC METABOLIC PANEL
Anion gap: 11 (ref 5–15)
BUN: 41 mg/dL — ABNORMAL HIGH (ref 8–23)
CO2: 22 mmol/L (ref 22–32)
Calcium: 8.6 mg/dL — ABNORMAL LOW (ref 8.9–10.3)
Chloride: 104 mmol/L (ref 98–111)
Creatinine, Ser: 0.63 mg/dL (ref 0.44–1.00)
GFR, Estimated: >60 mL/min (ref >60)
Glucose, Bld: 89 mg/dL (ref 70–99)
Potassium: 3.8 mmol/L (ref 3.5–5.1)
Sodium: 137 mmol/L (ref 135–145)

## 2023-08-24 LAB — RESP PANEL BY RT-PCR (RSV, FLU A&B, COVID)  RVPGX2
Influenza A by PCR: NEGATIVE
Influenza B by PCR: NEGATIVE
Resp Syncytial Virus by PCR: NEGATIVE
SARS Coronavirus 2 by RT PCR: NEGATIVE

## 2023-08-24 NOTE — ED Provider Triage Note (Signed)
Emergency Medicine Provider Triage Evaluation Note  Tracey Wyatt , a 68 y.o. female  was evaluated in triage.  Sent to ED for reported AMS.  Pt brought by EMS.  From [facility]    Review of Systems  Positive: +MR,  Negative: No reported fever, N,v,d  Physical Exam  BP 119/84   Pulse 84   Temp 98.1 F (36.7 C) (Axillary)   Resp 18   SpO2 95%  Gen:   Awake, no distress  Will respond to verbal for short period.  Sleeping mostly.   Resp:  Normal effort  MSK:    Other:    Medical Decision Making  Medically screening exam initiated at 2:27 PM.  Appropriate orders placed.  Tracey Wyatt was informed that the remainder of the evaluation will be completed by another provider, this initial triage assessment does not replace that evaluation, and the importance of remaining in the ED until their evaluation is complete.     Tommi Rumps, PA-C 08/24/23 1430

## 2023-08-24 NOTE — ED Triage Notes (Addendum)
[facility].  Staff said unrewsponsive since Friday, but was alert and able to move when they picked her up. Noarmall oriented and walks fine.  Today sleepy and issues walking.  No falls, no change in meds.  Fsbs 141. T 99.0  has on a brief.  109/68  hr 80s    hx MR, SA and seizures.

## 2023-08-24 NOTE — ED Notes (Signed)
Upon arrival to room pt was able to stand and pivot to sit on stretcher. Pt open eyes when told to. Change pt's brief and pulled up in bed.

## 2023-08-24 NOTE — ED Provider Notes (Signed)
[facility] Provider Note    Event Date/Time   First MD Initiated Contact with Patient 08/24/23 2058     (approximate)   History   Altered Mental Status   HPI  Tracey Wyatt is a 68 y.o. female who presents to the ED for evaluation of Altered Mental Status   Review of cardiology clinic visit from October.  HTN, GERD, seizure disorder, dilated cardiomyopathy.   Patient presents to the ED due to altered mentation.  History is limited.  Patient cannot tell me why she is here and I am uncertain of her baseline.  Per reports, she has been "unresponsive since Friday."  She is not responsive now, she can tell me her name, date of birth and can follow some simple commands.   Physical Exam   Triage Vital Signs: ED Triage Vitals [08/24/23 1411]  Encounter Vitals Group     BP 119/84     Systolic BP Percentile      Diastolic BP Percentile      Pulse Rate 84     Resp 18     Temp 98.1 F (36.7 C)     Temp Source Axillary     SpO2 95 %     Weight      Height      Head Circumference      Peak Flow      Pain Score      Pain Loc      Pain Education      Exclude from Growth Chart     Most recent vital signs: Vitals:   08/24/23 2237 08/24/23 2300  BP:  112/83  Pulse:  84  Resp:  18  Temp: 98.8 F (37.1 C)   SpO2:  100%    General: Awake, no distress.  CV:  Good peripheral perfusion.  Resp:  Normal effort.  Abd:  No distention.  MSK:  No deformity noted.  Neuro:  No focal deficits appreciated. Other:     ED Results / Procedures / Treatments   Labs (all labs ordered are listed, but only abnormal results are displayed) Labs Reviewed  CBC WITH DIFFERENTIAL/PLATELET - Abnormal; Notable for the following components:      Result Value   RBC 3.14 (*)    Hemoglobin 9.5 (*)    HCT 27.9 (*)    Monocytes Absolute 1.2 (*)    All other components within normal limits  BASIC METABOLIC PANEL - Abnormal; Notable for the following components:    BUN 41 (*)    Calcium 8.6 (*)    All other components within normal limits  URINALYSIS, ROUTINE W REFLEX MICROSCOPIC - Abnormal; Notable for the following components:   Color, Urine AMBER (*)    APPearance CLEAR (*)    Ketones, ur 5 (*)    Protein, ur 30 (*)    All other components within normal limits  RESP PANEL BY RT-PCR (RSV, FLU A&B, COVID)  RVPGX2    EKG   RADIOLOGY CXR interpreted by me without evidence of acute cardiopulmonary pathology. CT head interpreted by me without evidence of acute intracranial pathology  Official radiology report(s): DG Chest Portable 1 View Result Date: 08/24/2023 CLINICAL DATA:  Unresponsive since Friday. EXAM: PORTABLE CHEST 1 VIEW COMPARISON:  07/24/2023 FINDINGS: Shallow inspiration. Heart size and pulmonary vascularity are normal for technique. Lungs are clear. No pleural effusion. No pneumothorax. Mediastinal contours appear intact. Calcification of the aorta. Degenerative changes in the spine and shoulder. IMPRESSION: No  active disease. Electronically Signed   By: Burman Nieves M.D.   On: 08/24/2023 22:10   CT HEAD WO CONTRAST ( ) Result Date: 08/24/2023 CLINICAL DATA:  nonfocal altered EXAM: CT HEAD WITHOUT CONTRAST TECHNIQUE: Contiguous axial images were obtained from the base of the skull through the vertex without intravenous contrast. RADIATION DOSE REDUCTION: This exam was performed according to the departmental dose-optimization program which includes automated exposure control, adjustment of the mA and/or kV according to patient size and/or use of iterative reconstruction technique. COMPARISON:  CT head 07/24/2023, CT head 07/07/2023 FINDINGS: Brain: Patchy and confluent areas of decreased attenuation are noted throughout the deep and periventricular white matter of the cerebral hemispheres bilaterally, compatible with chronic microvascular ischemic disease. No evidence of large-territorial acute infarction. No parenchymal hemorrhage. No  mass lesion. No extra-axial collection. No mass effect or midline shift. No hydrocephalus. Basilar cisterns are patent. Vascular: No hyperdense vessel. Skull: No acute fracture or focal lesion. Sinuses/Orbits: Paranasal sinuses and mastoid air cells are clear. Bilateral lens replacement. Otherwise the orbits are unremarkable. Other: None. IMPRESSION: No acute intracranial abnormality. Electronically Signed   By: Tish Frederickson M.D.   On: 08/24/2023 21:43    PROCEDURES and INTERVENTIONS:  Procedures  Medications - No data to display   IMPRESSION / MDM / ASSESSMENT AND PLAN / ED COURSE  I reviewed the triage vital signs and the nursing notes.  Differential diagnosis includes, but is not limited to, sepsis, AKI, ICH, viral syndrome, polypharmacy  {Patient presents with symptoms of an acute illness or injury that is potentially life-threatening.  Patient presents to the ED with nonfocal altered mentation.  History is limited so uncertain chronicity or other focal symptoms.  CT head and CXR reassuring.  Basic labs are benign without white count.  Essentially normal metabolic panel.  Urine with small ketones but no infectious features.  Awaiting viral swabs around the time of signout to oncoming physician.      FINAL CLINICAL IMPRESSION(S) / ED DIAGNOSES   Final diagnoses:  Altered mental status, unspecified altered mental status type     Rx / DC Orders   ED Discharge Orders     None        Note:  This document was prepared using Dragon voice recognition software and may include unintentional dictation errors.   Delton Prairie, MD 08/24/23 [phone]

## 2023-08-25 ENCOUNTER — Encounter: Payer: Self-pay | Admitting: Internal Medicine

## 2023-08-25 ENCOUNTER — Other Ambulatory Visit: Payer: Self-pay

## 2023-08-25 ENCOUNTER — Inpatient Hospital Stay
Admission: EM | Admit: 2023-08-25 | Discharge: 2023-09-04 | DRG: 070 | Disposition: A | Discharge status: Skilled Nursing Facility | Payer: Medicare Other | Attending: Internal Medicine | Admitting: Internal Medicine

## 2023-08-25 DIAGNOSIS — I10 Essential (primary) hypertension: Secondary | ICD-10-CM | POA: Diagnosis present

## 2023-08-25 DIAGNOSIS — G40909 Epilepsy, unspecified, not intractable, without status epilepticus: Secondary | ICD-10-CM | POA: Diagnosis present

## 2023-08-25 DIAGNOSIS — F209 Schizophrenia, unspecified: Secondary | ICD-10-CM | POA: Diagnosis present

## 2023-08-25 DIAGNOSIS — Z7983 Long term (current) use of bisphosphonates: Secondary | ICD-10-CM

## 2023-08-25 DIAGNOSIS — E785 Hyperlipidemia, unspecified: Secondary | ICD-10-CM | POA: Diagnosis present

## 2023-08-25 DIAGNOSIS — I420 Dilated cardiomyopathy: Secondary | ICD-10-CM | POA: Diagnosis present

## 2023-08-25 DIAGNOSIS — Z7982 Long term (current) use of aspirin: Secondary | ICD-10-CM

## 2023-08-25 DIAGNOSIS — E876 Hypokalemia: Secondary | ICD-10-CM | POA: Diagnosis present

## 2023-08-25 DIAGNOSIS — E8809 Other disorders of plasma-protein metabolism, not elsewhere classified: Secondary | ICD-10-CM | POA: Diagnosis present

## 2023-08-25 DIAGNOSIS — G2401 Drug induced subacute dyskinesia: Secondary | ICD-10-CM | POA: Diagnosis present

## 2023-08-25 DIAGNOSIS — E46 Unspecified protein-calorie malnutrition: Secondary | ICD-10-CM | POA: Diagnosis present

## 2023-08-25 DIAGNOSIS — G9341 Metabolic encephalopathy: Principal | ICD-10-CM | POA: Diagnosis present

## 2023-08-25 DIAGNOSIS — A4101 Sepsis due to Methicillin susceptible Staphylococcus aureus: Principal | ICD-10-CM | POA: Diagnosis not present

## 2023-08-25 DIAGNOSIS — Z681 Body mass index (BMI) 19 or less, adult: Secondary | ICD-10-CM

## 2023-08-25 DIAGNOSIS — Z515 Encounter for palliative care: Secondary | ICD-10-CM

## 2023-08-25 DIAGNOSIS — Z79899 Other long term (current) drug therapy: Secondary | ICD-10-CM

## 2023-08-25 DIAGNOSIS — Z87891 Personal history of nicotine dependence: Secondary | ICD-10-CM

## 2023-08-25 DIAGNOSIS — R4189 Other symptoms and signs involving cognitive functions and awareness: Secondary | ICD-10-CM | POA: Diagnosis present

## 2023-08-25 DIAGNOSIS — R627 Adult failure to thrive: Secondary | ICD-10-CM | POA: Diagnosis present

## 2023-08-25 DIAGNOSIS — G255 Other chorea: Secondary | ICD-10-CM | POA: Diagnosis present

## 2023-08-25 DIAGNOSIS — D734 Cyst of spleen: Secondary | ICD-10-CM | POA: Diagnosis present

## 2023-08-25 DIAGNOSIS — K219 Gastro-esophageal reflux disease without esophagitis: Secondary | ICD-10-CM | POA: Diagnosis present

## 2023-08-25 DIAGNOSIS — R569 Unspecified convulsions: Secondary | ICD-10-CM

## 2023-08-25 DIAGNOSIS — Z66 Do not resuscitate: Secondary | ICD-10-CM | POA: Diagnosis present

## 2023-08-25 DIAGNOSIS — F0390 Unspecified dementia without behavioral disturbance: Secondary | ICD-10-CM | POA: Diagnosis present

## 2023-08-25 DIAGNOSIS — B9561 Methicillin susceptible Staphylococcus aureus infection as the cause of diseases classified elsewhere: Secondary | ICD-10-CM

## 2023-08-25 DIAGNOSIS — Z888 Allergy status to other drugs, medicaments and biological substances status: Secondary | ICD-10-CM

## 2023-08-25 DIAGNOSIS — Z885 Allergy status to narcotic agent status: Secondary | ICD-10-CM

## 2023-08-25 DIAGNOSIS — R4182 Altered mental status, unspecified: Principal | ICD-10-CM | POA: Diagnosis present

## 2023-08-25 DIAGNOSIS — K449 Diaphragmatic hernia without obstruction or gangrene: Secondary | ICD-10-CM | POA: Diagnosis present

## 2023-08-25 DIAGNOSIS — Z1152 Encounter for screening for COVID-19: Secondary | ICD-10-CM

## 2023-08-25 LAB — COMPREHENSIVE METABOLIC PANEL
ALT: 14 U/L (ref 0–44)
AST: 16 U/L (ref 15–41)
Albumin: 2.7 g/dL — ABNORMAL LOW (ref 3.5–5.0)
Alkaline Phosphatase: 36 U/L — ABNORMAL LOW (ref 38–126)
Anion gap: 9 (ref 5–15)
BUN: 39 mg/dL — ABNORMAL HIGH (ref 8–23)
CO2: 26 mmol/L (ref 22–32)
Calcium: 8.3 mg/dL — ABNORMAL LOW (ref 8.9–10.3)
Chloride: 106 mmol/L (ref 98–111)
Creatinine, Ser: 0.5 mg/dL (ref 0.44–1.00)
GFR, Estimated: >60 mL/min (ref >60)
Glucose, Bld: 95 mg/dL (ref 70–99)
Potassium: 4 mmol/L (ref 3.5–5.1)
Sodium: 141 mmol/L (ref 135–145)
Total Bilirubin: 0.8 mg/dL (ref 0.0–1.2)
Total Protein: 5.4 g/dL — ABNORMAL LOW (ref 6.5–8.1)

## 2023-08-25 LAB — CBC WITH DIFFERENTIAL/PLATELET
Abs Immature Granulocytes: 0.01 10*3/uL (ref 0.00–0.07)
Basophils Absolute: 0 10*3/uL (ref 0.0–0.1)
Basophils Relative: 0 %
Eosinophils Absolute: 0 10*3/uL (ref 0.0–0.5)
Eosinophils Relative: 0 %
HCT: 31.5 % — ABNORMAL LOW (ref 36.0–46.0)
Hemoglobin: 10.6 g/dL — ABNORMAL LOW (ref 12.0–15.0)
Immature Granulocytes: 0 %
Lymphocytes Relative: 20 %
Lymphs Abs: 1.1 10*3/uL (ref 0.7–4.0)
MCH: 30.3 pg (ref 26.0–34.0)
MCHC: 33.7 g/dL (ref 30.0–36.0)
MCV: 90 fL (ref 80.0–100.0)
Monocytes Absolute: 0.8 10*3/uL (ref 0.1–1.0)
Monocytes Relative: 15 %
Neutro Abs: 3.6 10*3/uL (ref 1.7–7.7)
Neutrophils Relative %: 65 %
Platelets: 204 10*3/uL (ref 150–400)
RBC: 3.5 MIL/uL — ABNORMAL LOW (ref 3.87–5.11)
RDW: 11.8 % (ref 11.5–15.5)
WBC: 5.5 10*3/uL (ref 4.0–10.5)
nRBC: 0 % (ref 0.0–0.2)

## 2023-08-25 LAB — URINALYSIS, ROUTINE W REFLEX MICROSCOPIC
Bacteria, UA: NONE SEEN
Bilirubin Urine: NEGATIVE
Glucose, UA: NEGATIVE mg/dL
Hgb urine dipstick: NEGATIVE
Ketones, ur: 5 mg/dL — AB
Leukocytes,Ua: NEGATIVE
Nitrite: NEGATIVE
Protein, ur: 30 mg/dL — AB
Specific Gravity, Urine: 1.029 (ref 1.005–1.030)
Squamous Epithelial / HPF: 0 /[HPF] (ref 0–5)
pH: 5 (ref 5.0–8.0)

## 2023-08-25 LAB — TROPONIN I (HIGH SENSITIVITY)
Troponin I (High Sensitivity): 12 ng/L (ref <18)
Troponin I (High Sensitivity): 12 ng/L (ref <18)
Troponin I (High Sensitivity): 12 ng/L (ref <18)

## 2023-08-25 LAB — VALPROIC ACID LEVEL: Valproic Acid Lvl: 20 ug/mL — ABNORMAL LOW (ref 50.0–100.0)

## 2023-08-25 MED ORDER — BENZTROPINE MESYLATE 1 MG PO TABS
2.0000 mg | ORAL_TABLET | Freq: Three times a day (TID) | ORAL | Status: DC
  Filled 2023-08-25: qty 2

## 2023-08-25 MED ORDER — METOPROLOL TARTRATE 25 MG PO TABS: 25.0000 mg | ORAL_TABLET | Freq: Two times a day (BID) | ORAL | Status: DC

## 2023-08-25 MED ORDER — ASPIRIN 325 MG PO TABS
325.0000 mg | ORAL_TABLET | Freq: Every day | ORAL | Status: DC
  Administered 2023-08-30: 325 mg via ORAL
  Filled 2023-08-25 (×5): qty 1

## 2023-08-25 MED ORDER — LISINOPRIL 10 MG PO TABS: 10.0000 mg | ORAL_TABLET | ORAL | Status: DC

## 2023-08-25 MED ORDER — FERROUS SULFATE 75 (15 FE) MG/ML PO SOLN
15.0000 mg | Freq: Every day | ORAL | Status: DC
  Administered 2023-08-30: 15 mg via ORAL
  Filled 2023-08-25 (×6): qty 1

## 2023-08-25 MED ORDER — DIVALPROEX SODIUM 250 MG PO DR TAB: 250.0000 mg | DELAYED_RELEASE_TABLET | Freq: Three times a day (TID) | ORAL | Status: DC

## 2023-08-25 MED ORDER — LACTATED RINGERS IV BOLUS
1000.0000 mL | Freq: Once | INTRAVENOUS | Status: AC
Start: 2023-08-25 — End: 2023-08-25
  Administered 2023-08-25: 1000 mL via INTRAVENOUS

## 2023-08-25 MED ORDER — PANTOPRAZOLE SODIUM 40 MG PO TBEC: 80.0000 mg | DELAYED_RELEASE_TABLET | Freq: Every day | ORAL | Status: DC

## 2023-08-25 MED ORDER — POLYETHYLENE GLYCOL 3350 17 G PO PACK
17.0000 g | PACK | Freq: Every day | ORAL | Status: DC
  Filled 2023-08-25 (×2): qty 1

## 2023-08-25 NOTE — ED Notes (Signed)
RM Madison informed of new pt in ED 13

## 2023-08-25 NOTE — ED Triage Notes (Signed)
Pt comes via EMs from [facility]. Pt here for AMS. Not sure what pt baseline is. Pt was just here yesterday for unresponsiveness.  25 ben, 50 bicarb for possible od of home meds, 20 g in forearm.   CBG 116

## 2023-08-25 NOTE — ED Provider Notes (Signed)
-----------------------------------------   12:45 AM on 08/25/2023 -----------------------------------------   Respiratory panel and troponin negative.  Unremarkable EKG.  Patient with eyes open, responds to simple commands and questions.  Nurse to call facility to establish if this is patient's baseline.  ED ECG REPORT I, Klynn Linnemann J, the attending physician, personally viewed and interpreted this ECG.   Date: 08/25/2023  EKG Time: 0033  Rate: 90  Rhythm: normal sinus rhythm  Axis: Normal  Intervals:none  ST&T Change: Nonspecific  ----------------------------------------- 1:53 AM on 08/25/2023 -----------------------------------------   Able to speak with staff at [facility] who state patient's baseline is always in a bad mood, always moving around, able to speak limited words.  Sounds like patient is at her baseline.  Will discharge back via EMS per staff request.  Strict return precautions given.  Staff verbalized understanding and agree with plan of care.   Hyatt Capobianco J, MD 08/25/23 0600

## 2023-08-25 NOTE — Subjective & Objective (Addendum)
 Tracey Wyatt, a 68 y/o with h/o HTN, Seizure disorder, psychiatric disorders - depression, schizophrenia, pyromania. She took haldol  for many years and developed tardive diskinesia but has been of haldol  and risperidone  for >10 years. She has resided in a long term care facility for 20 years. . Over the past year she has slowly declined: losing weight, hair turned gray, she had behavior problems. For  two months she has had a progressive decline and over the past several weeks has become withdrawn and less communicative, decreased PO intake, worsening weight loss, and progressive decline in ability to manage ADLs and ambulate. Her brother is the primary historian and has been involved with her care for years. She was seen in [facility] 08/24/23 for these problems. Her workup including labs, CT brain, U/A were unrevealing. She was returned to her long term care facility. She now returns to [facility] with worsening symptoms, essentially non-communicative.

## 2023-08-25 NOTE — Discharge Instructions (Addendum)
Take your medicines daily as directed by your doctor.  Return to the ER for worsening symptoms, persistent vomiting, difficulty breathing or other concerns. 

## 2023-08-25 NOTE — ED Notes (Signed)
Per Dr. Cyril Loosen, no need to repeat any orders during triage at this time.

## 2023-08-25 NOTE — Assessment & Plan Note (Signed)
BP well controlled. At this point she is not able to take po medications  Plan Hydralazine 2 mg q6 for SBP > 160

## 2023-08-25 NOTE — ED Notes (Signed)
Attempting to call [facility] x[facility]. Unable to make contact with anyone at this time.

## 2023-08-25 NOTE — ED Triage Notes (Addendum)
 Pt returns from [facility] via EMS at the request of her brother due to AMS. Brother reports she is normally more oriented and will open her eyes. Her speech is normally difficult to understand, but she does try to communicate. Staff at [facility] says she won't eat. Brother reports 3 weeks ago, she would sit at the table and eat. Pt answers name and birthday, and she can identify her brother correctly. Pt thinks it is January. Pt following commands. Pt just discharged this morning. Family is concerned because she has had significant decline since December.

## 2023-08-25 NOTE — ED Notes (Signed)
Pt removed IV and continues to pull off monitors. Will continue to monitor.

## 2023-08-25 NOTE — ED Notes (Signed)
Called [facility] to arrange transport back to [facility] waiting on [facility] to arrive

## 2023-08-25 NOTE — ED Provider Notes (Signed)
 [facility] Provider Note    Event Date/Time   First MD Initiated Contact with Patient 08/25/23 [phone]     (approximate)   History   Chief Complaint Altered Mental Status   HPI  Tracey Wyatt is a 68 y.o. female with past medical history of hypertension, seizures, and cardiomyopathy presents to the ED for altered mental status.  Patient's brother at bedside reports that she has been in a general decline for the past 2 months.  He states that she has been increasingly weak and less interactive during this time, has also been eating and drinking very little with significant weight loss.  She currently resides at [facility], but is now having difficulty walking and caring for herself there.  He is not aware of any fevers, cough, shortness of breath, nausea, vomiting, or diarrhea.  Patient unable to provide any history at this time.      Physical Exam   Triage Vital Signs: ED Triage Vitals [08/25/23 1303]  Encounter Vitals Group     BP (!) 119/93     Systolic BP Percentile      Diastolic BP Percentile      Pulse Rate 83     Resp 18     Temp (!) 97.5 F (36.4 C)     Temp Source Oral     SpO2 96 %     Weight      Height      Head Circumference      Peak Flow      Pain Score 0     Pain Loc      Pain Education      Exclude from Growth Chart     Most recent vital signs: Vitals:   08/25/23 1800 08/25/23 2138  BP: 126/72 132/77  Pulse: 88 80  Resp: 17 16  Temp: 98 F (36.7 C) 98.5 F (36.9 C)  SpO2: 100% 100%    Constitutional: Somnolent but arousable to voice, unable to answer questions.  Thin and cachectic. Eyes: Conjunctivae are normal.  Pupils equal, round, and reactive to light bilaterally. Head: Atraumatic. Nose: No congestion/rhinnorhea. Mouth/Throat: Mucous membranes are dry. Cardiovascular: Normal rate, regular rhythm. Grossly normal heart sounds.  2+ radial pulses bilaterally. Respiratory: Normal  respiratory effort.  No retractions. Lungs CTAB. Gastrointestinal: Soft and nontender. No distention. Musculoskeletal: No lower extremity tenderness nor edema.  Neurologic:  Normal speech and language.  Globally weak with no gross focal neurologic deficits appreciated.    ED Results / Procedures / Treatments   Labs (all labs ordered are listed, but only abnormal results are displayed) Labs Reviewed  URINALYSIS, ROUTINE W REFLEX MICROSCOPIC - Abnormal; Notable for the following components:      Result Value   Color, Urine AMBER (*)    APPearance CLEAR (*)    Ketones, ur 5 (*)    Protein, ur 30 (*)    All other components within normal limits  VALPROIC  ACID LEVEL - Abnormal; Notable for the following components:   Valproic  Acid Lvl 20 (*)    All other components within normal limits  COMPREHENSIVE METABOLIC PANEL - Abnormal; Notable for the following components:   BUN 39 (*)    Calcium 8.3 (*)    Total Protein 5.4 (*)    Albumin 2.7 (*)    Alkaline Phosphatase 36 (*)    All other components within normal limits  CBC WITH DIFFERENTIAL/PLATELET - Abnormal; Notable for the following components:  RBC 3.50 (*)    Hemoglobin 10.6 (*)    HCT 31.5 (*)    All other components within normal limits  CBC WITH DIFFERENTIAL/PLATELET  TROPONIN I (HIGH SENSITIVITY)  TROPONIN I (HIGH SENSITIVITY)     PROCEDURES:  Critical Care performed: No  Procedures   MEDICATIONS ORDERED IN ED: Medications  lactated ringers  bolus 1,000 mL (1,000 mLs Intravenous New Bag/Given 08/25/23 2056)     IMPRESSION / MDM / ASSESSMENT AND PLAN / ED COURSE  I reviewed the triage vital signs and the nursing notes.                              68 y.o. female with past medical history of hypertension, seizures, and cardiomyopathy who presents to the ED with gradually worsening weakness with decreasing oral intake over the past 2 months.  Patient's presentation is most consistent with acute presentation  with potential threat to life or bodily function.  Differential diagnosis includes, but is not limited to, failure to thrive, dehydration, electrolyte abnormality, AKI, UTI, anemia, psychosis, catatonia, stroke.  Patient chronically ill but nontoxic-appearing and in no acute distress, vital signs are unremarkable.  She is somnolent but arousable to voice, appears globally weak but has no focal neurologic deficits.  She was seen in the ED last night for similar symptoms, had CT head and chest x-ray at that time that were unremarkable.  We will repeat labs and EKG, also check Depakote  level.  Overall, she seems to be failing to thrive with worsening mental status and would consider catatonia if workup is unremarkable.  Anticipate admission to the hospital.  Labs today show elevated BUN to creatinine ratio consistent with dehydration but are otherwise unremarkable with no significant anemia, leukocytosis, electrolyte abnormality, or AKI.  LFTs are also unremarkable, 2 sets of troponin within normal limits, Depakote  level is subtherapeutic.  Given patient's failure to thrive and possible catatonia, case discussed with hospitalist for admission.      FINAL CLINICAL IMPRESSION(S) / ED DIAGNOSES   Final diagnoses:  Altered mental status, unspecified altered mental status type  Failure to thrive in adult     Rx / DC Orders   ED Discharge Orders     None        Note:  This document was prepared using Dragon voice recognition software and may include unintentional dictation errors.   Willo Dunnings, MD 08/25/23 2208

## 2023-08-26 DIAGNOSIS — I420 Dilated cardiomyopathy: Secondary | ICD-10-CM | POA: Diagnosis present

## 2023-08-26 DIAGNOSIS — Z7983 Long term (current) use of bisphosphonates: Secondary | ICD-10-CM | POA: Diagnosis not present

## 2023-08-26 DIAGNOSIS — R4189 Other symptoms and signs involving cognitive functions and awareness: Secondary | ICD-10-CM | POA: Diagnosis present

## 2023-08-26 DIAGNOSIS — Z87891 Personal history of nicotine dependence: Secondary | ICD-10-CM | POA: Diagnosis not present

## 2023-08-26 DIAGNOSIS — G40909 Epilepsy, unspecified, not intractable, without status epilepticus: Secondary | ICD-10-CM | POA: Diagnosis present

## 2023-08-26 DIAGNOSIS — K449 Diaphragmatic hernia without obstruction or gangrene: Secondary | ICD-10-CM | POA: Diagnosis present

## 2023-08-26 DIAGNOSIS — R634 Abnormal weight loss: Secondary | ICD-10-CM | POA: Diagnosis not present

## 2023-08-26 DIAGNOSIS — E785 Hyperlipidemia, unspecified: Secondary | ICD-10-CM | POA: Diagnosis present

## 2023-08-26 DIAGNOSIS — R569 Unspecified convulsions: Secondary | ICD-10-CM | POA: Diagnosis not present

## 2023-08-26 DIAGNOSIS — R4182 Altered mental status, unspecified: Secondary | ICD-10-CM | POA: Diagnosis present

## 2023-08-26 DIAGNOSIS — Z681 Body mass index (BMI) 19 or less, adult: Secondary | ICD-10-CM | POA: Diagnosis not present

## 2023-08-26 DIAGNOSIS — G9340 Encephalopathy, unspecified: Secondary | ICD-10-CM | POA: Diagnosis not present

## 2023-08-26 DIAGNOSIS — G2401 Drug induced subacute dyskinesia: Secondary | ICD-10-CM | POA: Diagnosis present

## 2023-08-26 DIAGNOSIS — R410 Disorientation, unspecified: Secondary | ICD-10-CM | POA: Diagnosis not present

## 2023-08-26 DIAGNOSIS — G9341 Metabolic encephalopathy: Secondary | ICD-10-CM | POA: Diagnosis present

## 2023-08-26 DIAGNOSIS — E43 Unspecified severe protein-calorie malnutrition: Secondary | ICD-10-CM | POA: Diagnosis not present

## 2023-08-26 DIAGNOSIS — D734 Cyst of spleen: Secondary | ICD-10-CM | POA: Diagnosis present

## 2023-08-26 DIAGNOSIS — E876 Hypokalemia: Secondary | ICD-10-CM | POA: Diagnosis present

## 2023-08-26 DIAGNOSIS — G255 Other chorea: Secondary | ICD-10-CM | POA: Diagnosis present

## 2023-08-26 DIAGNOSIS — B9561 Methicillin susceptible Staphylococcus aureus infection as the cause of diseases classified elsewhere: Secondary | ICD-10-CM | POA: Diagnosis not present

## 2023-08-26 DIAGNOSIS — F03918 Unspecified dementia, unspecified severity, with other behavioral disturbance: Secondary | ICD-10-CM

## 2023-08-26 DIAGNOSIS — I10 Essential (primary) hypertension: Secondary | ICD-10-CM | POA: Diagnosis present

## 2023-08-26 DIAGNOSIS — Z515 Encounter for palliative care: Secondary | ICD-10-CM | POA: Diagnosis not present

## 2023-08-26 DIAGNOSIS — F0390 Unspecified dementia without behavioral disturbance: Secondary | ICD-10-CM | POA: Diagnosis present

## 2023-08-26 DIAGNOSIS — Z7982 Long term (current) use of aspirin: Secondary | ICD-10-CM | POA: Diagnosis not present

## 2023-08-26 DIAGNOSIS — Z7189 Other specified counseling: Secondary | ICD-10-CM | POA: Diagnosis not present

## 2023-08-26 DIAGNOSIS — E8809 Other disorders of plasma-protein metabolism, not elsewhere classified: Secondary | ICD-10-CM | POA: Diagnosis present

## 2023-08-26 DIAGNOSIS — E46 Unspecified protein-calorie malnutrition: Secondary | ICD-10-CM | POA: Diagnosis present

## 2023-08-26 DIAGNOSIS — R627 Adult failure to thrive: Secondary | ICD-10-CM | POA: Diagnosis present

## 2023-08-26 DIAGNOSIS — A4101 Sepsis due to Methicillin susceptible Staphylococcus aureus: Secondary | ICD-10-CM | POA: Diagnosis not present

## 2023-08-26 DIAGNOSIS — K219 Gastro-esophageal reflux disease without esophagitis: Secondary | ICD-10-CM | POA: Diagnosis present

## 2023-08-26 DIAGNOSIS — Z66 Do not resuscitate: Secondary | ICD-10-CM | POA: Diagnosis present

## 2023-08-26 DIAGNOSIS — F209 Schizophrenia, unspecified: Secondary | ICD-10-CM | POA: Diagnosis present

## 2023-08-26 DIAGNOSIS — R7881 Bacteremia: Secondary | ICD-10-CM | POA: Diagnosis not present

## 2023-08-26 DIAGNOSIS — Z1152 Encounter for screening for COVID-19: Secondary | ICD-10-CM | POA: Diagnosis not present

## 2023-08-26 MED ORDER — HYDRALAZINE HCL 20 MG/ML IJ SOLN: 2.0000 mg | Freq: Four times a day (QID) | INTRAMUSCULAR | Status: DC | PRN

## 2023-08-26 MED ORDER — LORAZEPAM 2 MG/ML IJ SOLN
0.5000 mg | INTRAMUSCULAR | Status: DC | PRN
  Administered 2023-08-26 – 2023-09-03 (×2): 0.5 mg via INTRAVENOUS
  Filled 2023-08-26 (×2): qty 1

## 2023-08-26 MED ORDER — VALPROATE SODIUM 100 MG/ML IV SOLN
140.0000 mg | Freq: Once | INTRAVENOUS | Status: AC
  Administered 2023-08-26: 140 mg via INTRAVENOUS
  Filled 2023-08-26: qty 1.4

## 2023-08-26 MED ORDER — ENOXAPARIN SODIUM 30 MG/0.3ML IJ SOSY
30.0000 mg | PREFILLED_SYRINGE | INTRAMUSCULAR | Status: DC
  Administered 2023-08-26 – 2023-08-31 (×5): 30 mg via SUBCUTANEOUS
  Filled 2023-08-26 (×5): qty 0.3

## 2023-08-26 MED ORDER — ACETAMINOPHEN 650 MG RE SUPP
RECTAL | Status: AC
  Filled 2023-08-26: qty 1

## 2023-08-26 MED ORDER — PANTOPRAZOLE SODIUM 40 MG IV SOLR
40.0000 mg | Freq: Every day | INTRAVENOUS | Status: DC
  Administered 2023-08-26 – 2023-08-30 (×6): 40 mg via INTRAVENOUS
  Filled 2023-08-26 (×6): qty 10

## 2023-08-26 MED ORDER — ACETAMINOPHEN 650 MG RE SUPP: 650.0000 mg | Freq: Once | RECTAL | Status: AC

## 2023-08-26 MED ORDER — DEXTROSE-SODIUM CHLORIDE 5-0.45 % IV SOLN: INTRAVENOUS | Status: AC

## 2023-08-26 MED ORDER — VALPROATE SODIUM 100 MG/ML IV SOLN
140.0000 mg | Freq: Three times a day (TID) | INTRAVENOUS | Status: DC
  Administered 2023-08-26 – 2023-08-27 (×3): 140 mg via INTRAVENOUS
  Filled 2023-08-26 (×4): qty 1.4

## 2023-08-26 NOTE — Progress Notes (Signed)
 PHARMACIST - PHYSICIAN COMMUNICATION  CONCERNING:  Enoxaparin  (Lovenox ) for DVT Prophylaxis    RECOMMENDATION: Patient was prescribed enoxaprin 40mg  q24 hours for VTE prophylaxis.   Filed Weights   08/26/23 0030  Weight: 43.1 kg (95 lb)    Body mass index is 15.33 kg/m.  Estimated Creatinine Clearance: 45.8 mL/min (by C-G formula based on SCr of 0.5 mg/dL).  Patient is candidate for enoxaparin  30mg  every 24 hours based on CrCl <71ml/min or Weight <45kg  DESCRIPTION: Pharmacy has adjusted enoxaparin  dose per [facility] policy.  Patient is now receiving enoxaparin  30 mg every 24 hours   Rankin CANDIE Dills, PharmD, [facility] 08/26/2023 12:58 AM

## 2023-08-26 NOTE — ED Notes (Signed)
 Pt has had in IP bed assigned for >20 mins, no handoff report received from IP receiving RN, charge RN Sherian Dimitri is aware of delay in transport to IP unit, charge has called unit, awaiting response.

## 2023-08-26 NOTE — Assessment & Plan Note (Signed)
 Patient with a long psychiatric h/o, remote h/o heavy alcohol  use. For over a year she has had progressive decline with behaviorial issues, cognitive changes and over the past several months rapid decline to where she is unable to care for herself, eat or take fluids. She is also having worsening tardive dyskinesia with facial movement and body movements with rigidity. No prior signs of Parkinson's or parkinson like issues. Frontal lobe lewey body dementia vs Alzheimers vs CJD are of concern. Spoke with brother and SIL about poor prognosis in these settings.   Plan Med-surg admit  IV hydration  IV dekapote  EEG  MRI if patients movements can be controlled.  IV ativan  for patient safety and agitation.  May need neurology consult later today 08/26/23

## 2023-08-26 NOTE — ED Notes (Signed)
 Family informed this RN that pt stated she needed to pee. This RN with help of another RN assisted pt onto bedpan. Pt was able to urinate.

## 2023-08-26 NOTE — Procedures (Signed)
 Eeg will be done tomorrow. Have 5 patients to do.  Best if patient is in a room as well, not in hallway.

## 2023-08-26 NOTE — ED Notes (Signed)
 Delay in pt transport d/t IP unit has not accepted handoff report, charge Sherian Dimitri is aware and has called the unit x2

## 2023-08-26 NOTE — Hospital Course (Addendum)
 Hospital course / significant events:   HPI: Tracey Wyatt, a 68 y/o with h/o HTN, tardive dyskinesia, vague history of seizure disorder, psychiatric disorders - depression, schizophrenia, pyromania. She took haldol  for many years and developed tardive diskinesia but has been of haldol  and risperidone  for >10 years. She has resided in a long term care facility for 20 years. . Over the past year she has slowly declined: losing weight, hair turned gray, she had behavior problems. For  two months she has had a progressive decline and over the past several weeks has become withdrawn and less communicative, decreased PO intake, worsening weight loss, and progressive decline in ability to manage ADLs and ambulate. Her brother is the primary historian and has been involved with her care for years.   Detailed neurology consult note 02/07 for other hisory:   While she was treated with Haldol  and risperidone  for many years they reports she has been off of any of those medications for at least 1 or 2 decades.  Unfortunately she has had progressive decline in her cognitive abilities as well as weight loss for the past year despite eating well   Due to her weight loss they were encouraging her to eat more and noted that she was still eating well 3 weeks ago although she was more slouched over fatigued appearing.  Now for the past 3 weeks she has hardly been able to eat and not walking well   Additionally they note that 6 - 8 weeks ago she was noted to be more aggressive with the staff hitting etc. and thinks some medication changes may have been made at the time, but they are unclear on details  She was seen in [facility] 08/24/23 for these problems. Her workup including labs, CT brain, U/A were unrevealing. She was returned to her long term care facility. She now returns to [facility] with worsening symptoms, essentially non-communicative.    02/04: to ED 02/05: admitted to hospitalist service, unable to get EEG until  tomorrow. Nonverbal, somnolent.  02/06: More awake today but disoriented, slow/difficult speech. SLP, PT, OT to see. EEG technically difficult study suggestive of mild diffuse encephalopathy. Neuro consult ordered. Qualifies for SNF rehab but concern for po intake 02/07: awake and interactive today, not eating much. Neuro recs as below - w/u movement d/o for reversible causes, reviewing meds for possible reversible causes. See A/P for full discussion - if feeding tube would provide nutrition while treatment takes time to help her chorea, then this would be reasonable but if the condition is expected to stay stable or deteriorate family would not want feeding tube and will consider comfort measures 02/08: more somnolent today and not eating. Palliative to see - see consult note. WBC up, infectious w/u and abx.  02/09: more alert this morning, says she's hungry, making jokes. NG attempted but pt did not tolerate bedside placement. Continue abx  02/10: presented options but it is complicated, feeding tube has risks and would not likely address underlying frailty given how low BMI has gotten, but could try to address nutrition and see if this helps / buys time to reassess QOL. Option for G-tube placement under anesthesia and would consider MRI and address pelvic cyst as well while under anesthesia which would require detailed coordination but is feasible, is also a lot to put the patient through. Family given time to consider. Family meeting was initially planned but decision made prior to that to transition to comfort measures, d/c aggressive workup and treatment, in  line with patient/family goals  02/11: remains somnolent, stable, not eating. Hospice reassess - not candidate for hospice but we don't have a good option for placement at home or other facility, expect will decompensate fairly quickly given frailty, sepsis.    Consultants:  Neurology  Palliative care Infectious disease Interventional  Radiology General surgery   Procedures/Surgeries: none      ASSESSMENT & PLAN:   Change in mental status /  Metabolic Encephalopathy  Progressive movement disorder working dx is tardive dyskinesia  Questionable seizure history  D/c meds and other workup Comfort measures    SIRS/Sepsis Bacteremia - (+)BCx Staph Aureus  D/c meds and other workup Comfort measures   Protein calorie malnutrition  Weight loss Long term calorie deprivation.  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  Underweight - under 18  overweight - 25 to 29 obese - 30 or more Class 1 obesity: BMI of 30.0 to 34 Class 2 obesity: BMI of 35.0 to 39 Class 3 obesity: BMI of 40.0 to 49 Super Morbid Obesity: BMI 50-59 Super-super Morbid Obesity: BMI 60+ Significantly low or high BMI is associated with higher medical risk.  Weight management advised as adjunct to other disease management and risk reduction treatments    DVT prophylaxis: n/a on comfort measures  IV fluids: n/a on comfort measures   Nutrition: n/a on comfort measures - diet for comfort as desired  Central lines / invasive devices: none  Code Status: DNR ACP documentation reviewed:  none on file in [facility]  [facility] needs: hospice following possible placement  Barriers to dispo / significant pending items:  anticipate hospital death vs may qualify for hospice house, no good placement options at the moment

## 2023-08-26 NOTE — Assessment & Plan Note (Signed)
Long term calories deprivation. Not a candidate for enteral feeding at this time but this may be necessary if family decides that heroic measures are appropriate.

## 2023-08-26 NOTE — ED Notes (Signed)
Clean brief applied to pt, new incontinent pad applied under pt, and pt given 2 warm blankets.

## 2023-08-26 NOTE — Progress Notes (Addendum)
 Pt arrived to room 122B from ED via stretcher at 2130, son at bedside. After completing assessment and doing HS care on patient, notified on call provider via secure chat message as follows:  PT was just admitted from the ER with Dementia, AMS, dehydration, failure to thrive AKI, psychosis, catatonia, anemia. Seizure disorder - valproic  acid level was low.   3 mins She is have severe dystonia ( TD) and uncontrolled body movements. But her oxygen level on room air was 79-86% I put 3L [place] on her and she elevated to 91-93%. She is tachy with an apical of 105 bpm. Her axillary temperature 99.8. She has D5 o.45 NSS at 75/hr When asked if she was having pain she noded yes and pointed to LLQ of abdomen, where she has been guarding.   Also she is NPO and I just want to clarify - NO PO MEDS - correct?   Provider responded to keep her NPO for now, so PO Cogentin  was held.

## 2023-08-26 NOTE — ED Notes (Signed)
 This RN assisted pt with oral swabs and lip moisturizer

## 2023-08-26 NOTE — Assessment & Plan Note (Signed)
Per brother patient had one seizure years ago. She takes depakote for behavioral management.   Plan EEG, r/o seizure, also CJD

## 2023-08-26 NOTE — H&P (Signed)
 History and Physical    Tracey Wyatt FMW:[id number] DOB: Sep 07, 1955 DOA: 08/25/2023  DOS: the patient was seen and examined on 08/25/2023  PCP: Alois Charm, MD (Inactive)   Patient coming from: Group Home  I have personally briefly reviewed patient's old medical records in [facility] Link  Tracey Wyatt, a 68 y/o with h/o HTN, Seizure disorder, psychiatric disorders - depression, schizophrenia, pyromania. She took haldol  for many years and developed tardive diskinesia but has been of haldol  and risperidone  for >10 years. She has resided in a long term care facility for 20 years. . Over the past year she has slowly declined: losing weight, hair turned gray, she had behavior problems. For  two months she has had a progressive decline and over the past several weeks has become withdrawn and less communicative, decreased PO intake, worsening weight loss, and progressive decline in ability to manage ADLs and ambulate. Her brother is the primary historian and has been involved with her care for years. She was seen in [facility] 08/24/23 for these problems. Her workup including labs, CT brain, U/A were unrevealing. She was returned to her long term care facility. She now returns to [facility] with worsening symptoms, essentially non-communicative.    ED Course: afebgrile  132/77  80-  16.  Patient non-communicative. Constant facial tick and body movement (chronic) with rigidity. Lab Cr 0.50  Alk Phos 36  Albumin 2.7, T Protein 54, Hgb 10.5, Valproic  acid level 20 (l), U/A negative. CT head 08/24/23 negative, CXR 08/24/23 NAD, EKG NSR, old inferior injury. [facility] called to admit for continue diagnostic evaluation and treatment  Review of Systems:  Review of Systems  Unable to perform ROS: Patient unresponsive    Past Medical History:  Diagnosis Date   Arthritis    Depression    GERD (gastroesophageal reflux disease)    Heart murmur    History of hiatal hernia    Hypertension    Pyromania     Seizures (HCC)    LAST ONE IN 2002 APPROXIMATELY   Substance abuse (HCC)     Past Surgical History:  Procedure Laterality Date   CHOLECYSTECTOMY N/A 04/08/2016   Procedure: LAPAROSCOPIC CHOLECYSTECTOMY WITH INTRAOPERATIVE CHOLANGIOGRAM;  Surgeon: Ozell DELENA Mano, MD;  Location: [facility];  Service: General;  Laterality: N/A;   COLONOSCOPY     COLONOSCOPY WITH PROPOFOL  N/A 11/18/2016   Procedure: COLONOSCOPY WITH PROPOFOL ;  Surgeon: Ruel Kung, MD;  Location: [facility];  Service: Endoscopy;  Laterality: N/A;   ESOPHAGOGASTRODUODENOSCOPY N/A 04/08/2016   Procedure: ESOPHAGOGASTRODUODENOSCOPY (EGD);  Surgeon: Ozell DELENA Mano, MD;  Location: [facility];  Service: General;  Laterality: N/A;   FOOT SURGERY Bilateral    LAPAROSCOPY N/A 04/08/2016   Procedure: LAPAROSCOPY DIAGNOSTIC;  Surgeon: Ozell DELENA Mano, MD;  Location: [facility];  Service: General;  Laterality: N/A;     reports that she quit smoking about 27 years ago. Her smoking use included cigarettes. She started smoking about 37 years ago. She has a 5 pack-year smoking history. She has never used smokeless tobacco. She reports that she does not currently use drugs. She reports that she does not drink alcohol .  Allergies  Allergen Reactions   Morphine Other (See Comments)    Other Reaction: Not Assessed   Procaine Other (See Comments)    History reviewed. No pertinent family history.  Prior to Admission medications   Medication Sig Start Date End Date Taking? Authorizing Provider  alendronate (FOSAMAX) 70 MG tablet Take 70 mg by mouth once  a week. Take with a full glass of water on an empty stomach EVERY WEDNESDAY    [provider]  aspirin  325 MG tablet Take 325 mg by mouth daily.    [provider]  benztropine  (COGENTIN ) 0.5 MG tablet Take 0.5 mg by mouth 2 (two) times daily.    [provider]  divalproex  (DEPAKOTE ) 250 MG DR tablet Take 250 mg by mouth 3 (three) times daily.    [provider]  esomeprazole (NEXIUM) 40 MG capsule Take 40 mg by mouth 2 (two) times daily before a meal.     [provider]  ferrous sulfate  (FER-IN-SOL) 75 (15 Fe) MG/ML SOLN Take by mouth.    [provider]  gabapentin (NEURONTIN) 300 MG capsule Take 300 mg by mouth 4 (four) times daily.    [provider]  levETIRAcetam (KEPPRA) 1000 MG tablet Take 1,000 mg by mouth 2 (two) times daily. 10/17/19   [provider]  lisinopril  (PRINIVIL ,ZESTRIL ) 10 MG tablet Take 10 mg by mouth every morning.    [provider]  metoprolol  tartrate (LOPRESSOR ) 25 MG tablet Take 25 mg by mouth 2 (two) times daily. 10/17/19   [provider]  Multiple Vitamin (MULTIVITAMIN) tablet Take 1 tablet by mouth daily.    [provider]  PARoxetine (PAXIL) 40 MG tablet Take 40 mg by mouth at bedtime.     [provider]  polyethylene glycol (MIRALAX  / GLYCOLAX ) packet Take 17 g by mouth daily.    [provider]    Physical Exam: Vitals:   08/25/23 1303 08/25/23 1800 08/25/23 2138  BP: (!) 119/93 126/72 132/77  Pulse: 83 88 80  Resp: 18 17 16   Temp: (!) 97.5 F (36.4 C) 98 F (36.7 C) 98.5 F (36.9 C)  TempSrc: Oral  Axillary  SpO2: 96% 100% 100%    Physical Exam Vitals and nursing note reviewed.  Constitutional:      General: She is not in acute distress.    Appearance: She is ill-appearing.     Comments: Emaciated woman.  HENT:     Head: Normocephalic and atraumatic.     Mouth/Throat:     Comments: Dry caked lips with old blood. Unable to exam oral cavity.  Eyes:     Extraocular Movements: Extraocular movements intact.     Conjunctiva/sclera: Conjunctivae normal.     Pupils: Pupils are equal, round, and reactive to light.  Neck:     Comments: Constant head and neck movement limited ability to exam neck. Patient unresponsive Cardiovascular:     Rate and Rhythm: Normal rate and regular rhythm.     Pulses: Normal pulses.      Heart sounds: Normal heart sounds.  Pulmonary:     Effort: Pulmonary effort is normal.     Breath sounds: Normal breath sounds.  Abdominal:     Palpations: There is no mass.     Tenderness: There is no guarding or rebound.     Comments: Scaphoid abdomen. No guarding or rebound, no masses, no tenderness based on lack of response to deep palpation.  Musculoskeletal:        General: No swelling or deformity.     Right lower leg: No edema.     Left lower leg: No edema.  Skin:    General: Skin is warm and dry.     Coloration: Skin is pale.  Neurological:     Mental Status: She is disoriented.     Comments:  Patient with constant facial movement, lip movement. No deviation of eye. Moves all extremity but not to command.      Labs on Admission: I have personally reviewed following labs and imaging studies  CBC: Recent Labs  Lab 08/24/23 1708 08/25/23 2046  WBC 8.7 5.5  NEUTROABS 6.0 3.6  HGB 9.5* 10.6*  HCT 27.9* 31.5*  MCV 88.9 90.0  PLT 189 204   Basic Metabolic Panel: Recent Labs  Lab 08/24/23 1708 08/25/23 1924  NA 137 141  K 3.8 4.0  CL 104 106  CO2 22 26  GLUCOSE 89 95  BUN 41* 39*  CREATININE 0.63 0.50  CALCIUM 8.6* 8.3*   GFR: CrCl cannot be calculated (Unknown ideal weight.). Liver Function Tests: Recent Labs  Lab 08/25/23 1924  AST 16  ALT 14  ALKPHOS 36*  BILITOT 0.8  PROT 5.4*  ALBUMIN 2.7*   No results for input(s): LIPASE, AMYLASE in the last 168 hours. No results for input(s): AMMONIA in the last 168 hours. Coagulation Profile: No results for input(s): INR, PROTIME in the last 168 hours. Cardiac Enzymes: No results for input(s): CKTOTAL, CKMB, CKMBINDEX, TROPONINI in the last 168 hours. BNP (last 3 results) No results for input(s): PROBNP in the last 8760 hours. HbA1C: No results for input(s): HGBA1C in the last 72 hours. CBG: No results for input(s): GLUCAP in the last 168 hours. Lipid Profile: No results for  input(s): CHOL, HDL, LDLCALC, TRIG, CHOLHDL, LDLDIRECT in the last 72 hours. Thyroid Function Tests: No results for input(s): TSH, T4TOTAL, FREET4, T3FREE, THYROIDAB in the last 72 hours. Anemia Panel: No results for input(s): VITAMINB12, FOLATE, FERRITIN, TIBC, IRON, RETICCTPCT in the last 72 hours. Urine analysis:    Component Value Date/Time   COLORURINE AMBER (A) 08/25/2023 2046   APPEARANCEUR CLEAR (A) 08/25/2023 2046   LABSPEC 1.029 08/25/2023 2046   PHURINE 5.0 08/25/2023 2046   GLUCOSEU NEGATIVE 08/25/2023 2046   HGBUR NEGATIVE 08/25/2023 2046   BILIRUBINUR NEGATIVE 08/25/2023 2046   KETONESUR 5 (A) 08/25/2023 2046   PROTEINUR 30 (A) 08/25/2023 2046   NITRITE NEGATIVE 08/25/2023 2046   LEUKOCYTESUR NEGATIVE 08/25/2023 2046    Radiological Exams on Admission: I have personally reviewed images DG Chest Portable 1 View Result Date: 08/24/2023 CLINICAL DATA:  Unresponsive since Friday. EXAM: PORTABLE CHEST 1 VIEW COMPARISON:  07/24/2023 FINDINGS: Shallow inspiration. Heart size and pulmonary vascularity are normal for technique. Lungs are clear. No pleural effusion. No pneumothorax. Mediastinal contours appear intact. Calcification of the aorta. Degenerative changes in the spine and shoulder. IMPRESSION: No active disease. Electronically Signed   By: Elsie Gravely M.D.   On: 08/24/2023 22:10   CT HEAD WO CONTRAST ( ) Result Date: 08/24/2023 CLINICAL DATA:  nonfocal altered EXAM: CT HEAD WITHOUT CONTRAST TECHNIQUE: Contiguous axial images were obtained from the base of the skull through the vertex without intravenous contrast. RADIATION DOSE REDUCTION: This exam was performed according to the departmental dose-optimization program which includes automated exposure control, adjustment of the mA and/or kV according to patient size and/or use of iterative reconstruction technique. COMPARISON:  CT head 07/24/2023, CT head 07/07/2023 FINDINGS: Brain:  Patchy and confluent areas of decreased attenuation are noted throughout the deep and periventricular white matter of the cerebral hemispheres bilaterally, compatible with chronic microvascular ischemic disease. No evidence of large-territorial acute infarction. No parenchymal hemorrhage. No mass lesion. No extra-axial collection. No mass effect or midline shift. No hydrocephalus. Basilar cisterns are patent. Vascular: No hyperdense vessel. Skull: No acute fracture  or focal lesion. Sinuses/Orbits: Paranasal sinuses and mastoid air cells are clear. Bilateral lens replacement. Otherwise the orbits are unremarkable. Other: None. IMPRESSION: No acute intracranial abnormality. Electronically Signed   By: Morgane  Naveau M.D.   On: 08/24/2023 21:43    EKG: I have personally reviewed EKG: NSR, no STEMI  Assessment/Plan Active Problems:   Change in mental status   Seizure (HCC)   Protein calorie malnutrition (HCC)   HTN (hypertension)   Dementia (HCC)    Assessment and Plan: Change in mental status Patient with a long psychiatric h/o, remote h/o heavy alcohol  use. For over a year she has had progressive decline with behaviorial issues, cognitive changes and over the past several months rapid decline to where she is unable to care for herself, eat or take fluids. She is also having worsening tardive dyskinesia with facial movement and body movements with rigidity. No prior signs of Parkinson's or parkinson like issues. Frontal lobe lewey body dementia vs Alzheimers vs CJD are of concern. Spoke with brother and SIL about poor prognosis in these settings.   Plan Med-surg admit  IV hydration  IV dekapote  EEG  MRI if patients movements can be controlled.  IV ativan  for patient safety and agitation.  May need neurology consult later today 08/26/23  Protein calorie malnutrition (HCC) Long term calories deprivation. Not a candidate for enteral feeding at this time but this may be necessary if family  decides that heroic measures are appropriate.   Seizure [facility]) Per brother patient had one seizure years ago. She takes depakote  for behavioral management.   Plan EEG, r/o seizure, also CJD  HTN (hypertension) BP well controlled. At this point she is not able to take po medications  Plan Hydralazine  2 mg q6 for SBP > 160       DVT prophylaxis: Lovenox  Code Status: DNR/DNI(Do NOT Intubate) Family Communication: spoke with brother who is primary historian and SIL, an [employer] who works in long term care  Disposition Plan: TBD  Consults called: none  Admission status: Inpatient, Step Down Unit   Ozell Haggis, MD [facility] 08/26/2023, 12:13 AM

## 2023-08-26 NOTE — ED Notes (Signed)
 Family asked this RN if the pt could have some water because she was asking for some. This RN attempted to assist pt with sipping water. Pt had a hard time sealing lips around the straw. Pt was able to take a small sip and then coughed afterwards. This RN told family we will hold off on drinking for now - then helped pt with oral swabs and lip moisturizer. Pt's lips are very dry, chapped and bleeding.

## 2023-08-26 NOTE — Progress Notes (Signed)
 Brief Progress Note (See full H&P from earlier today)   Hospital course / significant events:   HPI: Ms. Alderman, a 68 y/o with h/o HTN, Seizure disorder, psychiatric disorders - depression, schizophrenia, pyromania. She took haldol  for many years and developed tardive diskinesia but has been of haldol  and risperidone  for >10 years. She has resided in a long term care facility for 20 years. . Over the past year she has slowly declined: losing weight, hair turned gray, she had behavior problems. For  two months she has had a progressive decline and over the past several weeks has become withdrawn and less communicative, decreased PO intake, worsening weight loss, and progressive decline in ability to manage ADLs and ambulate. Her brother is the primary historian and has been involved with her care for years. She was seen in [facility] 08/24/23 for these problems. Her workup including labs, CT brain, U/A were unrevealing. She was returned to her long term care facility. She now returns to [facility] with worsening symptoms, essentially non-communicative.    02/04: to ED 02/05: admitted to hospitalist service, unable to get EEG until tomorrow.      Consultants:  none  Procedures/Surgeries: none      ASSESSMENT & PLAN:   Change in mental status Suspect advancement of underlying dementia complicated by underlying TD Patient with a long psychiatric h/o, remote h/o heavy alcohol  use. For over a year she has had progressive decline with behaviorial issues, cognitive changes and over the past several months rapid decline to where she is unable to care for herself, eat or take fluids. She is also having worsening tardive dyskinesia with facial movement and body movements with rigidity. No prior signs of Parkinson's or parkinson like issues. Frontal lobe lewey body dementia vs Alzheimers vs CJD are of concern. Spoke with brother and SIL about poor prognosis in these settings.  Med-surg admit IV  hydration IV dekapote EEG MRI if patients movements can be controlled. IV ativan  for patient safety and agitation. May need neurology consult    Protein calorie malnutrition  Long term calories deprivation.  NPO for now Discussed w/ brother that if po intake is not possible, will need to discuss end of life measures as she is not a good candidate for enteral feeding    Seizure [facility]) Per brother patient had one seizure years ago. She takes depakote  for behavioral management.  EEG, r/o seizure   HTN (hypertension) BP well controlled. At this point she is not able to take po medications Hydralazine  2 mg q6 for SBP > 160      underweight based on BMI: Body mass index is 15.33 kg/m.  Underweight - under 18  overweight - 25 to 29 obese - 30 or more Class 1 obesity: BMI of 30.0 to 34 Class 2 obesity: BMI of 35.0 to 39 Class 3 obesity: BMI of 40.0 to 49 Super Morbid Obesity: BMI 50-59 Super-super Morbid Obesity: BMI 60+ Significantly low or high BMI is associated with higher medical risk.  Weight management advised as adjunct to other disease management and risk reduction treatments    DVT prophylaxis: lovenox   IV fluids: D5 continuous IV fluids  Nutrition: NPO Central lines / invasive devices: none  Code Status: DNR confirmed w/ brother  ACP documentation reviewed:  none on file in [facility]  TOC needs: TBD, expect will need PTC or hospice care  Barriers to dispo / significant pending items: EEG, possible neuro eval, palliative care consult likely pending EEG  Subjective: Pt unable to contribute Brother at bedside confirms the above    Objective: Relevant new results: chart reviewed, nothing relevant  Physical Exam:  BP 127/83 (BP Location: Right Arm)   Pulse 88   Temp 98 F (36.7 C) (Oral)   Resp 19   Ht 5' 6 (1.676 m)   Wt 43.1 kg   SpO2 98%   BMI 15.33 kg/m  Constitutional:  General Appearance: NAD, somnolent Respiratory: Normal respiratory  effort Musculoskeletal:  No clubbing/cyanosis of digits Neurological: Comnolent, rocking / twitching but no tonic-clonic  Psychiatric: Unable to assess

## 2023-08-27 ENCOUNTER — Inpatient Hospital Stay: Payer: Medicare Other

## 2023-08-27 ENCOUNTER — Ambulatory Visit: Payer: Medicare Other

## 2023-08-27 ENCOUNTER — Ambulatory Visit (HOSPITAL_COMMUNITY)
Admit: 2023-08-27 | Discharge: 2023-08-27 | Disposition: A | Discharge status: Home / Self Care | Payer: Medicare Other | Attending: Internal Medicine | Admitting: Internal Medicine

## 2023-08-27 DIAGNOSIS — R4182 Altered mental status, unspecified: Secondary | ICD-10-CM | POA: Insufficient documentation

## 2023-08-27 DIAGNOSIS — R410 Disorientation, unspecified: Secondary | ICD-10-CM | POA: Diagnosis not present

## 2023-08-27 DIAGNOSIS — R569 Unspecified convulsions: Secondary | ICD-10-CM | POA: Diagnosis not present

## 2023-08-27 LAB — TSH: TSH: 0.461 u[IU]/mL (ref 0.350–4.500)

## 2023-08-27 LAB — VITAMIN D 25 HYDROXY (VIT D DEFICIENCY, FRACTURES): Vit D, 25-Hydroxy: 82.12 ng/mL (ref 30–100)

## 2023-08-27 LAB — VITAMIN B12: Vitamin B-12: 827 pg/mL (ref 180–914)

## 2023-08-27 LAB — MRSA NEXT GEN BY PCR, NASAL: MRSA by PCR Next Gen: NOT DETECTED

## 2023-08-27 LAB — C-REACTIVE PROTEIN: CRP: 13 mg/dL — ABNORMAL HIGH (ref <1.0)

## 2023-08-27 LAB — HIV ANTIBODY (ROUTINE TESTING W REFLEX): HIV Screen 4th Generation wRfx: NONREACTIVE

## 2023-08-27 LAB — FOLATE: Folate: 13.3 ng/mL (ref >5.9)

## 2023-08-27 LAB — AMMONIA: Ammonia: 25 umol/L (ref 9–35)

## 2023-08-27 LAB — SEDIMENTATION RATE: Sed Rate: 30 mm/h (ref 0–30)

## 2023-08-27 MED ORDER — VALPROATE SODIUM 100 MG/ML IV SOLN
120.0000 mg | Freq: Three times a day (TID) | INTRAVENOUS | Status: DC
  Administered 2023-08-27 – 2023-09-04 (×22): 120 mg via INTRAVENOUS
  Filled 2023-08-27 (×28): qty 1.2

## 2023-08-27 MED ORDER — DEXTROSE-SODIUM CHLORIDE 5-0.45 % IV SOLN: INTRAVENOUS | Status: AC

## 2023-08-27 NOTE — Progress Notes (Addendum)
 EEG complete - results pending

## 2023-08-27 NOTE — Plan of Care (Signed)

## 2023-08-27 NOTE — Consult Note (Signed)
 NEUROLOGY CONSULT NOTE   Date of service: August 27, 2023 Patient Name: Tracey Wyatt MRN:  [id number] DOB:  June 13, 1956 Chief Complaint: worsening tardive dyskinesia Requesting Provider: Marsa Edelman, DO History obtained from brother Tracey Wyatt at bedside as well as another brother by phone, as well as review of records in her chart, limited from patient given her history of cognitive decline and tardive dyskinesias limiting speech  History of Present Illness  Tracey Wyatt is a 68 y.o. 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  He reports she was with him they just got home when she was suddenly breathing heavily with her arms flapping and 1 minute of loss of consciousness without tongue bite or bowel or bladder incontinence, returning to her normal cognitive state within 5 minutes.  She has not previously seen a neurologist to family's recollection.  However there is some notes from 2003 hospital evaluation at [facility].  At that time she did have movements concerning for choreoathetosis and was genetically tested for [facility] and Huntington's disease which resulted negative.  MRI demonstrated an incidental finding of an irregular shaped mass with well-defined border seen in the midline in the region along the superior aspect of the sphenoid sinus. From the MRI report, the lesion seemed to be consistent with either dermoid cyst or chondroid lesion.  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  Family reports she has never had any other concern for seizure activity.  Per brother at bedside one family history of abnormal movements or significant psychiatric history. She has had a gradually progressive decline.  She did graduate from high school and was able to care for herself for much of her life.  Does not have any children.  While she was treated with Haldol  and risperidone  for many years they reports she has been off of any of those medications for at least 1 or 2 decades.  Unfortunately she has had progressive decline in her cognitive abilities as well as weight loss for the past year despite eating well  Due to her weight loss they were encouraging her to eat more and noted that she was still eating well 3 weeks ago although she was more slouched over fatigued appearing.  Now for the past 3 weeks she has hardly been able to eat and not walking well  Additionally they note that 6 - 8 weeks ago she was noted to be more aggressive with the staff hitting etc. and thinks some medication changes may have been made at the time, but they are unclear on details  They are unsure how long she has been on Keppra and whether she has had any side effects of this medication  She is unable to tolerate MRI due to her significant choreoathetoid movements     ROS  Limited by patient's baseline cognitive impairments and tardive dyskinesias limiting speech, obtained from family as above  Past History   Past Medical History:  Diagnosis Date   Arthritis  Depression    GERD (gastroesophageal reflux disease)    Heart murmur    History of hiatal hernia    Hypertension    Pyromania    Seizures (HCC)    LAST ONE IN 2002 APPROXIMATELY   Substance abuse (HCC)     Past Surgical History:  Procedure Laterality Date   CHOLECYSTECTOMY N/A 04/08/2016   Procedure: LAPAROSCOPIC  CHOLECYSTECTOMY WITH INTRAOPERATIVE CHOLANGIOGRAM;  Surgeon: Tracey DELENA Mano, Wyatt;  Location: [facility];  Service: General;  Laterality: N/A;   COLONOSCOPY     COLONOSCOPY WITH PROPOFOL  N/A 11/18/2016   Procedure: COLONOSCOPY WITH PROPOFOL ;  Surgeon: Tracey Wyatt;  Location: [facility];  Service: Endoscopy;  Laterality: N/A;   ESOPHAGOGASTRODUODENOSCOPY N/A 04/08/2016   Procedure: ESOPHAGOGASTRODUODENOSCOPY (EGD);  Surgeon: Tracey DELENA Mano, Wyatt;  Location: [facility];  Service: General;  Laterality: N/A;   FOOT SURGERY Bilateral    LAPAROSCOPY N/A 04/08/2016   Procedure: LAPAROSCOPY DIAGNOSTIC;  Surgeon: Tracey DELENA Mano, Wyatt;  Location: [facility];  Service: General;  Laterality: N/A;    Family History: History reviewed. No pertinent family history.  Social History  reports that she quit smoking about 27 years ago. Her smoking use included cigarettes. She started smoking about 37 years ago. She has a 5 pack-year smoking history. She has never used smokeless tobacco. She reports that she does not currently use drugs. She reports that she does not drink alcohol .  Allergies  Allergen Reactions   A+D Diaper Rash [Diaper Rash Products]    Morphine Other (See Comments)    Other Reaction: Not Assessed   Procaine Other (See Comments)    Medications   Current Facility-Administered Medications:    aspirin  tablet 325 mg, 325 mg, Oral, Daily, Tracey, Tracey Wyatt, Wyatt   benztropine  (COGENTIN ) tablet 2 mg, 2 mg, Oral, TID, Tracey, Tracey Wyatt, Wyatt   dextrose  5 % and 0.45 % NaCl infusion, , Intravenous, Continuous, Tracey Wyatt, Tracey L, NP, Last Rate: 75 mL/hr at 08/27/23 0648, New Bag at 08/27/23 9351   enoxaparin  (LOVENOX ) injection 30 mg, 30 mg, Subcutaneous, Q24H, Tracey, Tracey Wyatt, Wyatt, 30 mg at 08/27/23 1053   ferrous sulfate  (FER-IN-SOL) 75 (15 Fe) MG/ML solution 15 mg of iron, 15 mg of iron, Oral, Daily, Tracey, Tracey Wyatt, Wyatt   hydrALAZINE  (APRESOLINE ) injection 2 mg, 2 mg, Intravenous, Q6H PRN, Tracey,  Tracey Wyatt, Wyatt   LORazepam  (ATIVAN ) injection 0.5 mg, 0.5 mg, Intravenous, Q4H PRN, Tracey, Tracey Wyatt, Wyatt, 0.5 mg at 08/26/23 1522   pantoprazole  (PROTONIX ) injection 40 mg, 40 mg, Intravenous, QHS, Tracey, Tracey Wyatt, Wyatt, 40 mg at 08/26/23 2158   polyethylene glycol (MIRALAX  / GLYCOLAX ) packet 17 g, 17 g, Oral, Daily, Tracey, Tracey Wyatt, Wyatt   valproate (DEPACON ) 120 mg in dextrose  5 % 50 mL IVPB, 120 mg, Intravenous, Q8H, Tracey Edelman, DO  Vitals   Vitals:   08/26/23 2046 08/26/23 2147 08/27/23 0649 08/27/23 0843  BP: (!) 129/109 126/88 (!) 117/101 129/86  Pulse: 88 92 97 (!) 109  Resp: 16 18 20 17   Temp: 98.8 F (37.1 C) 99.1 F (37.3 C) 98.9 F (37.2 C) 98.5 F (36.9 C)  TempSrc: Axillary     SpO2: 97% 91% 98% 98%  Weight:  36.2 kg    Height:  5' 6 (1.676 m)      Body mass index is 12.88 kg/m.  Physical Exam   Constitutional: Appears cachectic Psych: Affect calm, cooperative Eyes: No scleral injection.  HENT: No OP obstruction.  Head: Normocephalic.  Cardiovascular: Normal rate and regular rhythm.  Respiratory: Effort normal, non-labored breathing.  Nasal cannula in place on 2-3 Wyatt of oxygen GI: Soft.  No distension. There is no tenderness.  Skin: Bruise in the left antecubital fossa  Neurologic Examination    Neuro: Mental Status: Patient is awake, alert, speech limited by significant tardive dyskinesias but able to identify her brother at bedside, able to follow some commands with delays due to her movement disorder Cranial Nerves: II: Pupils are equal, round, and reactive to light, 6 mm to 3 mm sluggish but reactive equally bilaterally III,IV, VI: EOM difficult to assess due to frequent blinking but able to briefly maintain eyes open with encouragement and look from side-to-side V: Facial sensation is symmetric to light touch VII: Facial movement is symmetric within limits of tardive dyskinetic movements.  VIII: hearing is intact to voice X: Uvula elevates  symmetrically XI: Shoulder shrug is symmetric. XII: tongue does not fully protrude due to tardive dyskinesia Motor: Reduced muscle bulk throughout.  Choreoathetoid movements even at rest (but minimal at rest), markedly worsening with attempts at movement Sensory: Sensation is symmetric to light touch and temperature in the arms and legs. Deep Tendon Reflexes: 2+ and symmetric brachioradialis Cerebellar: Finger is intact within limits of tardive dyskinesias Gait: Reports slight dizziness with sitting up on the side of the bed and then standing.  Continues to have significant choreoathetoid movements on standing but able to maintain her stance with minimal assistance.  Did not attempt ambulation due to nasal cannula in place   Labs/Imaging/Neurodiagnostic studies   ESR 30 CRP mildly elevated at 13  Folate 13.3 B12 827 Vit D 82.12  CK 169  Ammonia 25  TSH 0.461  HIV  RPR neg  Pending nutritional cognitive impairment labs: - B1, B3, B6, MMA,    Pending movement disorder related labs:  ANA w/ reflex, SPEP and IFE with 4 times daily, 24-hour urine UPEP/U IFE/light chains/total protein, serum copper , 24-hour urine copper   CBC:  Recent Labs  Lab 08/24/23 1708 08/25/23 2046  WBC 8.7 5.5  NEUTROABS 6.0 3.6  HGB 9.5* 10.6*  HCT 27.9* 31.5*  MCV 88.9 90.0  PLT 189 204   Basic Metabolic Panel:  Lab Results  Component Value Date   NA 141 08/25/2023   K 4.0 08/25/2023   CO2 26 08/25/2023   GLUCOSE 95 08/25/2023   BUN 39 (H) 08/25/2023   CREATININE 0.50 08/25/2023   CALCIUM 8.3 (Wyatt) 08/25/2023   GFRNONAA >60 08/25/2023   GFRAA >60 04/03/2016   Lipid Panel: No results found for: LDLCALC HgbA1c: No results found for: HGBA1C Urine Drug Screen: No results found for: LABOPIA, COCAINSCRNUR, LABBENZ, AMPHETMU, THCU, LABBARB  Alcohol  Level No results found for: ETH INR No results found for: INR APTT No results found for: APTT AED levels: No results  found for: PHENYTOIN, ZONISAMIDE, LAMOTRIGINE, LEVETIRACETA  CT Head without contrast(Personally reviewed): No acute intracranial process.  Does have some chronic microvascular disease but does not seem to have any clear focal atrophy on my review  Prior CT head 12/2022 Incidental left thyroid nodule measuring 2 cm. Recommend non-emergent thyroid ultrasound.   Neurodiagnostics rEEG:  Limited by movement artifact, but no epileptiform activities within these limits, continuous slow/generalized  ASSESSMENT   JESSEY HUYETT is a 68 y.o. 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  Given the report of some new behavior changes in the last 6 to 8 weeks, suspect this may be  medication exposure related.  Additionally, it is not clear that she truly has a significant seizure history.  As Keppra can be associated with mood disturbances and given her significant psychiatric history I do favor stopping the Keppra for now.  Will start by reducing benztropine  from 2 mg 3 times daily to 1 mg 3 times daily; this medication can at times worsen movement disorders and is associated with cognitive impairment  May need to reduce Depakote  as well, however it seems she has been on 2 antiseizure medications for some time now and it is unclear when she stopped gabapentin -- I hesitate to make too many changes simultaneously.  In informal consultation with a movement disorder specialist, may consider restarting risperidone  to see if this improves movements enough for her to tolerate p.o. intake depending on her clinical course  Will also obtain labs for screening for reversible causes of cognitive impairment and reversible causes of chorea as detailed below  I am concerned that her weight loss may be primarily secondary to the severity of her movements (caloric needs), but would screen for malignancy as well again to rule out treatable etiology.   Low concern for paraneoplastic cause of movement disorders given the chronicity described but could consider testing for this if there is evidence of malignancy  RECOMMENDATIONS  - B1, B3, B6, B12, MMA, folate, HIV, RPR, TSH, Ammonia, ESR, CRP, Vit D for reversible causes workup (ordered, reassuring so far other than minimal CRP elevation of uncertain significance) - CK added onto morning labs, within normal limits - Other treatable causes of chorea screening: ANA w/ reflex, SPEP and IFE with 4 times daily, 24-hour urine UPEP/U IFE/light chains/total protein, serum copper , 24-hour urine copper  - 100 mg IV thiamine  daily pending level, may be discontinued if level results normal - UDS add on if possible (ordered) - Currently unable to obtain MRI due to restlessness, likely will be unable to obtain anesthetized MRI at [facility] but would consider obtaining this if she undergoes anesthesia for any other reason - CT chest/abdomen/pelvis to evaluate for occult malignancy - Consider thyroid ultrasound previously recommended in June if thyroid nodule is still present - Stop keppra in case this is contributing to mood issues/agitation described - Clarify medication changes in last 2-3 months from facility, review is pending and appreciate pharmacy assistance - Neurology will follow along ______________________________________________________________________   Lola Jernigan Wyatt-PhD [facility] [phone] [facility] coverage for [facility] is from 8 AM to 4 AM in-house and 4 PM to 8 PM by telephone/video. 8 PM to 8 AM emergent questions or overnight urgent questions should be addressed to Teleneurology On-call or Jolynn Pack neurohospitalist; contact information can be found on AMION

## 2023-08-27 NOTE — Progress Notes (Signed)
 PROGRESS NOTE    [name]   FMW:[id number] DOB: Mar 29, 1956  DOA: 08/25/2023 Date of Service: 08/27/23 which is hospital day 1  PCP: Alois Charm, MD (Inactive)    Hospital course / significant events:   HPI: [name], a 68 y/o with h/o HTN, Seizure disorder, psychiatric disorders - depression, schizophrenia, pyromania. She took haldol  for many years and developed tardive diskinesia but has been of haldol  and risperidone  for >10 years. She has resided in a long term care facility for 20 years. . Over the past year she has slowly declined: losing weight, hair turned gray, she had behavior problems. For  two months she has had a progressive decline and over the past several weeks has become withdrawn and less communicative, decreased PO intake, worsening weight loss, and progressive decline in ability to manage ADLs and ambulate. Her brother is the primary historian and has been involved with her care for years. She was seen in [facility] 08/24/23 for these problems. Her workup including labs, CT brain, U/A were unrevealing. She was returned to her long term care facility. She now returns to [facility] with worsening symptoms, essentially non-communicative.    02/04: to ED 02/05: admitted to hospitalist service, unable to get EEG until tomorrow.  02/06: EEG still pending. More awake today but disoriented, slow/difficult speech. SLP, PT, OT to see. Neuro to see to confirm dx/prognosis.    Consultants:  Neurology   Procedures/Surgeries: none      ASSESSMENT & PLAN:   Change in mental status - Suspect advancement of underlying dementia complicated by underlying TD Patient with a long psychiatric hx: remote h/o heavy alcohol  use. For over a year she has had progressive decline with behaviorial issues, cognitive changes and over the past several months rapid decline to where she is unable to care for herself, eat or take fluids. She is also having worsening tardive dyskinesia  with facial movement and body movements with rigidity. No prior signs of Parkinson's or parkinson like issues. Frontal lobe lewey body dementia vs Alzheimers vs CJD are of concern. Spoke with brother and SIL about poor prognosis in these settings.  IV hydration - ok to dc if able to take po per SLP IV dekapote per pharmacy  Neurology consulted today  EEG pending MRI if patients movements can be controlled / per neuro IV ativan  for patient safety and agitation.   Protein calorie malnutrition  Long term calories deprivation.  NPO for now SLP to eval now that she's more alert and following commands  Discussed w/ brother that if po intake is not possible, may need to discuss end of life measures as she is not a good candidate for enteral feeding - low threshold for palliative involvement pending diagnostics and prognostication    Seizure [facility]) Per brother patient had one seizure years ago. She takes depakote  for behavioral management.  EEG   HTN (hypertension) BP well controlled. At this point she is not able to take po medications Hydralazine  2 mg q6 for SBP > 160      underweight based on BMI: Body mass index is 15.33 kg/m.  Underweight - under 18  overweight - 25 to 29 obese - 30 or more Class 1 obesity: BMI of 30.0 to 34 Class 2 obesity: BMI of 35.0 to 39 Class 3 obesity: BMI of 40.0 to 49 Super Morbid Obesity: BMI 50-59 Super-super Morbid Obesity: BMI 60+ Significantly low or high BMI is associated with higher medical risk.  Weight management advised as  adjunct to other disease management and risk reduction treatments    DVT prophylaxis: lovenox   IV fluids: D5 continuous IV fluids  Nutrition: NPO pend SLP Central lines / invasive devices: none  Code Status: DNR confirmed w/ brother  ACP documentation reviewed:  none on file in [facility]  TOC needs: TBD, expect will need PTC or hospice care  Barriers to dispo / significant pending items: NPO pending SLP eval EEG, neuro  eval, expect palliative care                 Subjective: Pt unable to contribute much - denies pain, can tell us  she is in the hospital but not why, not giving detailed meaningful answers but appears calm Brother at bedside has no additional concerns  Denies CP/SOB.    Family Communication: brother at bedside on rounds     Objective Findings:  Vitals:   08/26/23 2046 08/26/23 2147 08/27/23 0649 08/27/23 0843  BP: (!) 129/109 126/88 (!) 117/101 129/86  Pulse: 88 92 97 (!) 109  Resp: 16 18 20 17   Temp: 98.8 F (37.1 C) 99.1 F (37.3 C) 98.9 F (37.2 C) 98.5 F (36.9 C)  TempSrc: Axillary     SpO2: 97% 91% 98% 98%  Weight:  36.2 kg    Height:  5' 6 (1.676 m)      Intake/Output Summary (Last 24 hours) at 08/27/2023 1352 Last data filed at 08/27/2023 9351 Gross per 24 hour  Intake --  Output 300 ml  Net -300 ml   Filed Weights   08/26/23 0030 08/26/23 2147  Weight: 43.1 kg 36.2 kg    Examination:  Physical Exam Constitutional:      General: She is not in acute distress.    Appearance: She is ill-appearing. She is not toxic-appearing.     Comments: frail  Cardiovascular:     Rate and Rhythm: Normal rate and regular rhythm.  Pulmonary:     Effort: Pulmonary effort is normal.     Breath sounds: Normal breath sounds.  Abdominal:     General: Abdomen is flat.     Palpations: Abdomen is soft.  Musculoskeletal:     Right lower leg: No edema.     Left lower leg: No edema.  Skin:    General: Skin is dry.  Neurological:     Mental Status: She is alert. She is disoriented.     Motor: Weakness present.  Psychiatric:        Behavior: Behavior normal.          Scheduled Medications:   aspirin   325 mg Oral Daily   benztropine   2 mg Oral TID   enoxaparin  (LOVENOX ) injection  30 mg Subcutaneous Q24H   ferrous sulfate   15 mg of iron Oral Daily   pantoprazole  (PROTONIX ) IV  40 mg Intravenous QHS   polyethylene glycol  17 g Oral Daily    Continuous  Infusions:  dextrose  5 % and 0.45 % NaCl 75 mL/hr at 08/27/23 0648   valproate sodium       PRN Medications:  hydrALAZINE , LORazepam   Antimicrobials from admission:  Anti-infectives (From admission, onward)    None           Data Reviewed:  I have personally reviewed the following...  CBC: Recent Labs  Lab 08/24/23 1708 08/25/23 2046  WBC 8.7 5.5  NEUTROABS 6.0 3.6  HGB 9.5* 10.6*  HCT 27.9* 31.5*  MCV 88.9 90.0  PLT 189 204   Basic Metabolic Panel: Recent  Labs  Lab 08/24/23 1708 08/25/23 1924  NA 137 141  K 3.8 4.0  CL 104 106  CO2 22 26  GLUCOSE 89 95  BUN 41* 39*  CREATININE 0.63 0.50  CALCIUM 8.6* 8.3*   GFR: Estimated Creatinine Clearance: 38.5 mL/min (by C-G formula based on SCr of 0.5 mg/dL). Liver Function Tests: Recent Labs  Lab 08/25/23 1924  AST 16  ALT 14  ALKPHOS 36*  BILITOT 0.8  PROT 5.4*  ALBUMIN 2.7*   No results for input(s): LIPASE, AMYLASE in the last 168 hours. No results for input(s): AMMONIA in the last 168 hours. Coagulation Profile: No results for input(s): INR, PROTIME in the last 168 hours. Cardiac Enzymes: No results for input(s): CKTOTAL, CKMB, CKMBINDEX, TROPONINI in the last 168 hours. BNP (last 3 results) No results for input(s): PROBNP in the last 8760 hours. HbA1C: No results for input(s): HGBA1C in the last 72 hours. CBG: No results for input(s): GLUCAP in the last 168 hours. Lipid Profile: No results for input(s): CHOL, HDL, LDLCALC, TRIG, CHOLHDL, LDLDIRECT in the last 72 hours. Thyroid Function Tests: No results for input(s): TSH, T4TOTAL, FREET4, T3FREE, THYROIDAB in the last 72 hours. Anemia Panel: No results for input(s): VITAMINB12, FOLATE, FERRITIN, TIBC, IRON, RETICCTPCT in the last 72 hours. Most Recent Urinalysis On File:     Component Value Date/Time   COLORURINE AMBER (A) 08/25/2023 2046   APPEARANCEUR CLEAR (A) 08/25/2023 2046    LABSPEC 1.029 08/25/2023 2046   PHURINE 5.0 08/25/2023 2046   GLUCOSEU NEGATIVE 08/25/2023 2046   HGBUR NEGATIVE 08/25/2023 2046   BILIRUBINUR NEGATIVE 08/25/2023 2046   KETONESUR 5 (A) 08/25/2023 2046   PROTEINUR 30 (A) 08/25/2023 2046   NITRITE NEGATIVE 08/25/2023 2046   LEUKOCYTESUR NEGATIVE 08/25/2023 2046   Sepsis Labs: @LABRCNTIP (procalcitonin:4,lacticidven:4) Microbiology: Recent Results (from the past 240 hours)  Resp panel by RT-PCR (RSV, Flu A&B, Covid) Urine, Catheterized     Status: None   Collection Time: 08/24/23 10:36 PM   Specimen: Urine, Catheterized; Nasal Swab  Result Value Ref Range Status   SARS Coronavirus 2 by RT PCR NEGATIVE NEGATIVE Final    Comment: (NOTE) SARS-CoV-2 target nucleic acids are NOT DETECTED.  The SARS-CoV-2 RNA is generally detectable in upper respiratory specimens during the acute phase of infection. The lowest concentration of SARS-CoV-2 viral copies this assay can detect is 138 copies/mL. A negative result does not preclude SARS-Cov-2 infection and should not be used as the sole basis for treatment or other patient management decisions. A negative result may occur with  improper specimen collection/handling, submission of specimen other than nasopharyngeal swab, presence of viral mutation(s) within the areas targeted by this assay, and inadequate number of viral copies(<138 copies/mL). A negative result must be combined with clinical observations, patient history, and epidemiological information. The expected result is Negative.  Fact Sheet for Patients:  bloggercourse.com  Fact Sheet for Healthcare Providers:  seriousbroker.it  This test is no t yet approved or cleared by the [place]  FDA and  has been authorized for detection and/or diagnosis of SARS-CoV-2 by FDA under an Emergency Use Authorization (EUA). This EUA will remain  in effect (meaning this test can be used) for  the duration of the COVID-19 declaration under Section 564(b)(1) of the Act, 21 U.S.C.section 360bbb-3(b)(1), unless the authorization is terminated  or revoked sooner.       Influenza A by PCR NEGATIVE NEGATIVE Final   Influenza B by PCR NEGATIVE NEGATIVE Final    Comment: (  NOTE) The Xpert Xpress SARS-CoV-2/FLU/RSV plus assay is intended as an aid in the diagnosis of influenza from Nasopharyngeal swab specimens and should not be used as a sole basis for treatment. Nasal washings and aspirates are unacceptable for Xpert Xpress SARS-CoV-2/FLU/RSV testing.  Fact Sheet for Patients: bloggercourse.com  Fact Sheet for Healthcare Providers: seriousbroker.it  This test is not yet approved or cleared by the [place]  FDA and has been authorized for detection and/or diagnosis of SARS-CoV-2 by FDA under an Emergency Use Authorization (EUA). This EUA will remain in effect (meaning this test can be used) for the duration of the COVID-19 declaration under Section 564(b)(1) of the Act, 21 U.S.C. section 360bbb-3(b)(1), unless the authorization is terminated or revoked.     Resp Syncytial Virus by PCR NEGATIVE NEGATIVE Final    Comment: (NOTE) Fact Sheet for Patients: bloggercourse.com  Fact Sheet for Healthcare Providers: seriousbroker.it  This test is not yet approved or cleared by the [place]  FDA and has been authorized for detection and/or diagnosis of SARS-CoV-2 by FDA under an Emergency Use Authorization (EUA). This EUA will remain in effect (meaning this test can be used) for the duration of the COVID-19 declaration under Section 564(b)(1) of the Act, 21 U.S.C. section 360bbb-3(b)(1), unless the authorization is terminated or revoked.  Performed at [facility], [address] [place], [place] [address]   MRSA Next Gen by PCR, Nasal     Status: None    Collection Time: 08/27/23  6:00 AM   Specimen: Nasal Mucosa; Nasal Swab  Result Value Ref Range Status   MRSA by PCR Next Gen NOT DETECTED NOT DETECTED Final    Comment: (NOTE) The GeneXpert MRSA Assay (FDA approved for NASAL specimens only), is one component of a comprehensive MRSA colonization surveillance program. It is not intended to diagnose MRSA infection nor to guide or monitor treatment for MRSA infections. Test performance is not FDA approved in patients less than 52 years old. Performed at [facility], [address], [place], [place] [address]       Radiology Studies last 3 days: DG Abd 1 View Result Date: 08/27/2023 CLINICAL DATA:  Abdominal pain EXAM: ABDOMEN - 1 VIEW COMPARISON:  None Available. FINDINGS: The bowel gas pattern is normal. No radio-opaque calculi or other significant radiographic abnormality are seen. IMPRESSION: Negative. Electronically Signed   By: Franky Stanford M.D.   On: 08/27/2023 04:08   DG Chest Portable 1 View Result Date: 08/24/2023 CLINICAL DATA:  Unresponsive since Friday. EXAM: PORTABLE CHEST 1 VIEW COMPARISON:  07/24/2023 FINDINGS: Shallow inspiration. Heart size and pulmonary vascularity are normal for technique. Lungs are clear. No pleural effusion. No pneumothorax. Mediastinal contours appear intact. Calcification of the aorta. Degenerative changes in the spine and shoulder. IMPRESSION: No active disease. Electronically Signed   By: Elsie Gravely M.D.   On: 08/24/2023 22:10   CT HEAD WO CONTRAST ( ) Result Date: 08/24/2023 CLINICAL DATA:  nonfocal altered EXAM: CT HEAD WITHOUT CONTRAST TECHNIQUE: Contiguous axial images were obtained from the base of the skull through the vertex without intravenous contrast. RADIATION DOSE REDUCTION: This exam was performed according to the departmental dose-optimization program which includes automated exposure control, adjustment of the mA and/or kV according to patient size and/or use of iterative  reconstruction technique. COMPARISON:  CT head 07/24/2023, CT head 07/07/2023 FINDINGS: Brain: Patchy and confluent areas of decreased attenuation are noted throughout the deep and periventricular white matter of the cerebral hemispheres bilaterally, compatible with chronic microvascular ischemic  disease. No evidence of large-territorial acute infarction. No parenchymal hemorrhage. No mass lesion. No extra-axial collection. No mass effect or midline shift. No hydrocephalus. Basilar cisterns are patent. Vascular: No hyperdense vessel. Skull: No acute fracture or focal lesion. Sinuses/Orbits: Paranasal sinuses and mastoid air cells are clear. Bilateral lens replacement. Otherwise the orbits are unremarkable. Other: None. IMPRESSION: No acute intracranial abnormality. Electronically Signed   By: Morgane  Naveau M.D.   On: 08/24/2023 21:43       Time spent: 50 min     Laneta Blunt, DO [facility] 08/27/2023, 1:52 PM    Dictation software may have been used to generate the above note. Typos may occur and escape review in typed/dictated notes. Please contact Dr Blunt directly for clarity if needed.  Staff may message me via secure chat in Epic  but this may not receive an immediate response,  please page me for urgent matters!  If 7PM-7AM, please contact night coverage [email or web address]

## 2023-08-27 NOTE — Progress Notes (Signed)
MRSA nasal swabs obtained and sent to the lab.

## 2023-08-27 NOTE — Procedures (Signed)
 Patient Name: Tracey Wyatt  MRN: [id number]  Epilepsy Attending: Arlin MALVA Krebs  Referring Physician/Provider: Harlow Ozell BRAVO, MD  Date: 08/27/2023 Duration: 25.33 mins  Patient history: 68yo female with ams. EEG to evaluate for seizure  Level of alertness: Awake  AEDs during EEG study: VPA  Technical aspects: This EEG study was done with scalp electrodes positioned according to the 10-20 International system of electrode placement. Electrical activity was reviewed with band pass filter of 1-70Hz , sensitivity of 7 uV/mm, display speed of 61mm/sec with a 60Hz  notched filter applied as appropriate. EEG data were recorded continuously and digitally stored.  Video monitoring was available and reviewed as appropriate.  Description: No clear posterior dominant rhythm was seen.  EEG showed continuous generalized 5 to 7 Hz theta slowing.    Of note patient was noted to have near continuous movements of mouth and head consistent with her history of tardive dyskinesia's.  Therefore EEG was difficult due to movement artifact.    Hyperventilation and photic stimulation were not performed.     ABNORMALITY - Continuous slow, generalized  IMPRESSION: This technically difficult study is suggestive of mild diffuse encephalopathy. No seizures or epileptiform discharges were seen throughout the recording.  Tracey Wyatt

## 2023-08-27 NOTE — Plan of Care (Signed)

## 2023-08-27 NOTE — Progress Notes (Addendum)
 SLP Cancellation Note  Patient Details Name: Tracey Wyatt MRN: [id number] DOB: 04/02/1956   Cancelled treatment:       Reason Eval/Treat Not Completed: Patient at procedure or test/unavailable (EEG in room)  Received order, met briefly w/ Son in room. Pt was being prepped for an EEG. Son reported pt had had a decline in status for ~2-3 months now but much worse in the last ~3 days. Per MD note, pt has suspected advancement of underlying dementia complicated by underlying TD; long psychiatric h/o, remote h/o heavy alcohol  use. For over a year she has had progressive decline with behaviorial issues, cognitive changes and over the past several months rapid decline to where she is unable to care for herself, eat or take fluids. She is also having worsening tardive dyskinesia with facial movement and body movements with rigidity.   ST services consulted MD to discuss pt's status. ST will hold on assessment pending Neurology consult/assessment. MD indicated involvement of Palliative Care as well. Recommend oral care for hygiene and stimulation of swallowing; aspiration precautions.  ST services will f/u w/ pt's status/needs tomorrow. MD agreed.    Comer Portugal, MS, CCC-SLP Speech Language Pathologist Rehab Services; [facility] [phone] (ascom) Lejend Dalby 08/27/2023, 3:43 PM

## 2023-08-27 NOTE — Progress Notes (Signed)
 Pt is currently laying in bed with eyes closed, still guarding her LLQ with her hand. Her dykinetic movements continue. Family at bedside - asleep. On call provider notified.

## 2023-08-27 NOTE — Evaluation (Signed)
 Physical Therapy Evaluation Patient Details Name: Tracey Wyatt MRN: [id number] DOB: 16-Dec-1955 Today's Date: 08/27/2023  History of Present Illness  presented to ER from group home and admitted secondary to progressive functional decline, unable to care for self, eat or drink  Clinical Impression  Patient resting in bed upon arrival to room; brother present at bedside throughout session.  Patient initially sleeping, but does open eyes on command.  Difficulty maintaining open due to facial twitching, dyskinesia.  Patient does occasionally answer questions with single-word responses; speech largely garbled and difficult to understand. Patient globally weak and deconditioned throughout all extremities; profound writing, dyskinesia noted throughout face, neck, trunk and extremities throughout session. Does seem to increase with effort (during bed mobility).  No clinical indicators of pain appreciated. Currently requiring max/total assist for rolling and scooting up in bed.  Does follow simple commands to mobilize extremities, initiate rolling and bridging with funcitonal activities; min/mod cuing, hand-over-hand assist to guide all movement for safety.  Additional mobility progression deferred due to continuous writhing, dyskinesia.  Will continue to assess/progress in subsequent sessions. Would benefit from skilled PT to address above deficits and promote optimal return to PLOF.; recommend post-acute PT follow up as indicated by interdisciplinary care team.          If plan is discharge home, recommend the following: Two people to help with walking and/or transfers;Two people to help with bathing/dressing/bathroom   Can travel by private vehicle   No    Equipment Recommendations    Recommendations for Other Services       Functional Status Assessment Patient has had a recent decline in their functional status and/or demonstrates limited ability to make significant improvements in function  in a reasonable and predictable amount of time     Precautions / Restrictions Precautions Precautions: Fall Precaution Comments: NPO Restrictions Weight Bearing Restrictions Per Provider Order: No      Mobility  Bed Mobility Overal bed mobility: Needs Assistance Bed Mobility: Rolling Rolling: Max assist, Total assist         General bed mobility comments: Patient does attempt to assist with UE/LEs as able, often requiring hand-over-hand assist due to severe dyskinesia    Transfers                   General transfer comment: unsafe/unable    Ambulation/Gait               General Gait Details: unsafe/unable  Stairs            Wheelchair Mobility     Tilt Bed    Modified Rankin (Stroke Patients Only)       Balance                                             Pertinent Vitals/Pain Pain Assessment Pain Assessment: Faces Faces Pain Scale: No hurt    Home Living Family/patient expects to be discharged to:: Skilled nursing facility Living Arrangements: Group Home                      Prior Function Prior Level of Function : Needs assist             Mobility Comments: Supervision for ADLs, household mobilization without assist device at baseline; brother does endorse progressive decline in recent 3-4 months with increased assist required for  all functional tasks/mobility.  Does endorse 2-3 falls in previous 3-4 months ADLs Comments: Supervision for ADLs; able to feed self and effectively communicate wants/needs     Extremity/Trunk Assessment   Upper Extremity Assessment Upper Extremity Assessment: Generalized weakness (grossly at least 3-/5 throughout; profound writhing, dyskinesia)    Lower Extremity Assessment Lower Extremity Assessment: Generalized weakness (grossly at least 3-/5 throughout; profound writhing, dyskinesia)    Cervical / Trunk Assessment Cervical / Trunk Assessment:  (prefers position  of L cervical rotation/lateral flexion; does demonstrate ability to move head/neck througohut full ROM iwth cuing)  Communication      Cognition Arousal: Lethargic   Overall Cognitive Status: Difficult to assess                                 General Comments: Per brother, at baseline, oriented to self, location and follows commands.  This date, does respond to nameand follow some simple commands with increased time for processing/initiation        General Comments      Exercises Other Exercises Other Exercises: Rolling bilat, max/total assist, for rotation, stabilization and overall safety; dep assist for undergarment change, hygiene and linen change after bladder incontinence.  Does follow simple commands to mobilize extremities, initiate rolling and bridging with funcitonal activities; min/mod cuing, hand-over-hand assist to guide all movement for safety.   Assessment/Plan    PT Assessment Patient needs continued PT services  PT Problem List Decreased strength;Decreased range of motion;Decreased activity tolerance;Decreased balance;Decreased mobility;Decreased coordination;Decreased cognition;Decreased knowledge of use of DME;Decreased safety awareness;Decreased knowledge of precautions       PT Treatment Interventions DME instruction;Gait training;Functional mobility training;Therapeutic exercise;Therapeutic activities;Patient/family education;Balance training    PT Goals (Current goals can be found in the Care Plan section)  Acute Rehab PT Goals Time For Goal Achievement: 09/10/23 Potential to Achieve Goals: Fair Additional Goals Additional Goal #1: Assess and establish goals for standing, gait progression as appropriate    Frequency Min 1X/week     Co-evaluation               AM-PAC PT 6 Clicks Mobility  Outcome Measure Help needed turning from your back to your side while in a flat bed without using bedrails?: A Lot Help needed moving from  lying on your back to sitting on the side of a flat bed without using bedrails?: Total Help needed moving to and from a bed to a chair (including a wheelchair)?: Total Help needed standing up from a chair using your arms (e.g., wheelchair or bedside chair)?: Total Help needed to walk in hospital room?: Total Help needed climbing 3-5 steps with a railing? : Total 6 Click Score: 7    End of Session     Patient left: in bed;with call bell/phone within reach;with bed alarm set;with family/visitor present   PT Visit Diagnosis: Other symptoms and signs involving the nervous system (R29.898)    Time: 1435-1500 PT Time Calculation (min) (ACUTE ONLY): 25 min   Charges:   PT Evaluation $PT Eval High Complexity: 1 High   PT General Charges $$ ACUTE PT VISIT: 1 Visit        Orene Abbasi H. Delores, PT, DPT, NCS 08/27/23, 11:00 PM [phone]

## 2023-08-28 ENCOUNTER — Inpatient Hospital Stay: Payer: Medicare Other

## 2023-08-28 DIAGNOSIS — R410 Disorientation, unspecified: Secondary | ICD-10-CM | POA: Diagnosis not present

## 2023-08-28 DIAGNOSIS — G255 Other chorea: Secondary | ICD-10-CM

## 2023-08-28 LAB — GLUCOSE, CAPILLARY
Glucose-Capillary: 116 mg/dL — ABNORMAL HIGH (ref 70–99)
Glucose-Capillary: 101 mg/dL — ABNORMAL HIGH (ref 70–99)
Glucose-Capillary: 109 mg/dL — ABNORMAL HIGH (ref 70–99)

## 2023-08-28 LAB — RPR: RPR Ser Ql: NONREACTIVE

## 2023-08-28 LAB — CK: Total CK: 169 U/L (ref 38–234)

## 2023-08-28 MED ORDER — BENZTROPINE MESYLATE 1 MG PO TABS
1.0000 mg | ORAL_TABLET | Freq: Three times a day (TID) | ORAL | Status: DC
  Administered 2023-08-28 – 2023-09-02 (×10): 1 mg via ORAL
  Filled 2023-08-28 (×14): qty 1

## 2023-08-28 MED ORDER — DEXTROSE-SODIUM CHLORIDE 5-0.45 % IV SOLN
INTRAVENOUS | Status: DC
Start: 2023-08-28 — End: 2023-08-29

## 2023-08-28 MED ORDER — IOHEXOL 300 MG/ML  SOLN
100.0000 mL | Freq: Once | INTRAMUSCULAR | Status: AC | PRN
  Administered 2023-08-28: 100 mL via INTRAVENOUS

## 2023-08-28 MED ORDER — THIAMINE HCL 100 MG/ML IJ SOLN
100.0000 mg | Freq: Every day | INTRAMUSCULAR | Status: DC
  Administered 2023-08-28 – 2023-08-31 (×4): 100 mg via INTRAVENOUS
  Filled 2023-08-28 (×4): qty 2

## 2023-08-28 NOTE — Care Management Important Message (Signed)
 Important Message  Patient Details  Name: Tracey Wyatt MRN: [id number] Date of Birth: 04-08-1956   Important Message Given:  Yes - Medicare IM     Felix Host 08/28/2023, 11:45 AM

## 2023-08-28 NOTE — Progress Notes (Signed)
 OT Cancellation Note  Patient Details Name: VENIE MONTESINOS MRN: [id number] DOB: [birth year]   Cancelled Treatment:    Reason Eval/Treat Not Completed: Patient at procedure or test/ unavailable. Chart reviewed, on arrival neurologist and labs at bedside. Will hold and initiate services as pt available.   Elston Slot, M.S. OTR/L  08/28/23, 10:49 AM  ascom [phone]

## 2023-08-28 NOTE — Evaluation (Signed)
 Occupational Therapy Evaluation Patient Details Name: Tracey Wyatt MRN: [id number] DOB: [birth year] Today's Date: 08/28/2023   History of Present Illness presented to ER from group home and admitted secondary to progressive functional decline, unable to care for self, eat or drink   Clinical Impression   Ms Brake was seen for OT evaluation this date. Prior to hospital admission, pt was MOD I using rollator, endorses recent falls. Pt lives at ALF. Pt currently requires MIN A + HHA for BSC t/f, +2 for safety. MAXA pericare and clothing mgmt in standing. Continual dyskinesia movements throughout limiting participation in ADLs. Pt would benefit from skilled OT to address noted impairments and functional limitations (see below for any additional details). Upon hospital discharge, recommend OT follow up.    If plan is discharge home, recommend the following: Supervision due to cognitive status;Help with stairs or ramp for entrance;A little help with bathing/dressing/bathroom;A little help with walking and/or transfers    Functional Status Assessment  Patient has had a recent decline in their functional status and demonstrates the ability to make significant improvements in function in a reasonable and predictable amount of time.  Equipment Recommendations  BSC/3in1    Recommendations for Other Services       Precautions / Restrictions Precautions Precautions: Fall Precaution Comments: NPO Restrictions Weight Bearing Restrictions Per Provider Order: No      Mobility Bed Mobility Overal bed mobility: Needs Assistance Bed Mobility: Supine to Sit, Sit to Supine     Supine to sit: Min assist, +2 for physical assistance Sit to supine: Contact guard assist        Transfers Overall transfer level: Needs assistance Equipment used: 2 person hand held assist Transfers: Sit to/from Stand Sit to Stand: Min assist, +2 safety/equipment                  Balance Overall balance  assessment: Needs assistance Sitting-balance support: Feet supported, No upper extremity supported Sitting balance-Leahy Scale: Fair     Standing balance support: Bilateral upper extremity supported Standing balance-Leahy Scale: Poor Standing balance comment: Poor balance due to dyskinesia, increased assist required to maintain balance fluctuating from CGA to MIN A                           ADL either performed or assessed with clinical judgement   ADL Overall ADL's : Needs assistance/impaired                                       General ADL Comments: MIN A + HHA for BSC t/f, +2 for safety. MAXA  pereicare and clothing mgmt in standing      Pertinent Vitals/Pain Pain Assessment Pain Assessment: Faces Faces Pain Scale: Hurts a little bit Pain Location: back Pain Descriptors / Indicators: Grimacing Pain Intervention(s): Limited activity within patient's tolerance     Extremity/Trunk Assessment Upper Extremity Assessment Upper Extremity Assessment: Generalized weakness   Lower Extremity Assessment Lower Extremity Assessment: Generalized weakness       Communication Communication Communication: Difficulty communicating thoughts/reduced clarity of speech   Cognition Arousal: Lethargic Behavior During Therapy: WFL for tasks assessed/performed Overall Cognitive Status: Difficult to assess                                 General Comments: Patient  follows simple commands with increased time and visual/gesture cues                Home Living   Living Arrangements: Group Home                               Additional Comments: rollator      Prior Functioning/Environment Prior Level of Function : Needs assist             Mobility Comments: Supervision for ADLs, household mobilization with rollator; brother does endorse progressive decline in recent 3-4 months with increased assist required for all  functional tasks/mobility.  Does endorse 2-3 falls in previous 3-4 months ADLs Comments: Supervision for ADLs; able to feed self and effectively communicate wants/needs        OT Problem List: Decreased strength;Decreased range of motion;Decreased activity tolerance;Impaired balance (sitting and/or standing)      OT Treatment/Interventions: Self-care/ADL training;Therapeutic exercise;Energy conservation;DME and/or AE instruction;Therapeutic activities    OT Goals(Current goals can be found in the care plan section) Acute Rehab OT Goals Patient Stated Goal: to go home OT Goal Formulation: With patient/family Time For Goal Achievement: 09/11/23 Potential to Achieve Goals: Fair ADL Goals Pt Will Perform Grooming: with supervision;standing Pt Will Perform Lower Body Dressing: with supervision;sit to/from stand Pt Will Transfer to Toilet: with supervision;ambulating;regular height toilet  OT Frequency: Min 1X/week    Co-evaluation PT/OT/SLP Co-Evaluation/Treatment: Yes Reason for Co-Treatment: Complexity of the patient's impairments (multi-system involvement);For patient/therapist safety PT goals addressed during session: Mobility/safety with mobility;Balance OT goals addressed during session: ADL's and self-care      AM-PAC OT 6 Clicks Daily Activity     Outcome Measure Help from another person eating meals?: None Help from another person taking care of personal grooming?: A Little Help from another person toileting, which includes using toliet, bedpan, or urinal?: A Lot Help from another person bathing (including washing, rinsing, drying)?: A Lot Help from another person to put on and taking off regular upper body clothing?: A Little Help from another person to put on and taking off regular lower body clothing?: A Lot 6 Click Score: 16   End of Session Equipment Utilized During Treatment: Rolling walker (2 wheels)  Activity Tolerance: Patient tolerated treatment well Patient  left: in bed;with call bell/phone within reach;with family/visitor present  OT Visit Diagnosis: Other abnormalities of gait and mobility (R26.89);Muscle weakness (generalized) (M62.81)                Time: 8666-8649 OT Time Calculation (min): 17 min Charges:  OT General Charges $OT Visit: 1 Visit OT Evaluation $OT Eval Low Complexity: 1 Low  Elston Slot, M.S. OTR/L  08/28/23, 3:29 PM  ascom [phone]

## 2023-08-28 NOTE — TOC Initial Note (Addendum)
 Transition of Care [facility]) - Initial/Assessment Note    Patient Details  Name: Tracey Wyatt MRN: [id number] Date of Birth: Mar 31, 1956  [facility]) CM/SW Contact:    Ladene Lady, LCSW Phone Number: 08/28/2023, 1:50 PM  Clinical Narrative:   Pt admitted from [facility]. Pt is disoriented and recs are for rehab. CSW has attempted to reach son but unable to reach and unable to leave a VM.  CSW spoke with clement at [facility] who states he thinks family is looking for placement at rehab but cannot confirm, he did confirm brother was POA and that the number was correct  Expected Discharge Plan: Skilled Nursing Facility Barriers to Discharge: Continued Medical Work up   Patient Goals and CMS Choice   CMS Medicare.gov Compare Post Acute Care list provided to:: Patient        Expected Discharge Plan and Services       Living arrangements for the past 2 months: Assisted Living Facility                                      Prior Living Arrangements/Services Living arrangements for the past 2 months: Assisted Living Facility Lives with:: Facility Resident Patient language and need for interpreter reviewed:: Yes        Need for Family Participation in Patient Care: Yes (Comment) Care giver support system in place?: Yes (comment) Current home services: DME    Activities of Daily Living   ADL Screening (condition at time of admission) Independently performs ADLs?: No Does the patient have a NEW difficulty with bathing/dressing/toileting/self-feeding that is expected to last >3 days?: Yes (Initiates electronic notice to provider for possible OT consult) Does the patient have a NEW difficulty with getting in/out of bed, walking, or climbing stairs that is expected to last >3 days?: Yes (Initiates electronic notice to provider for possible PT consult) Does the patient have a NEW difficulty with communication that is expected to last >3  days?: No Is the patient deaf or have difficulty hearing?: Yes Does the patient have difficulty seeing, even when wearing glasses/contacts?: Yes Does the patient have difficulty concentrating, remembering, or making decisions?: Yes  Permission Sought/Granted                  Emotional Assessment       Orientation: : Fluctuating Orientation (Suspected and/or reported Sundowners)      Admission diagnosis:  Altered mental status [R41.82] Dementia (HCC) [F03.90] Failure to thrive in adult [R62.7] Altered mental status, unspecified altered mental status type [R41.82] Patient Active Problem List   Diagnosis Date Noted   Dementia (HCC) 08/26/2023   Altered mental status 08/26/2023   HTN (hypertension) 08/25/2023   Seizure (HCC) 08/25/2023   Protein calorie malnutrition (HCC) 08/25/2023   Change in mental status 08/25/2023   Screen for colon cancer 10/29/2016   PCP:  Alois Charm, MD (Inactive) Pharmacy:   [facility] - [place], [place] - [address] [place] [address] Phone: [phone] Fax: [phone]     Social Drivers of Health (SDOH) Social History: SDOH Screenings   Food Insecurity: No Food Insecurity (08/26/2023)  Housing: Low Risk  (08/26/2023)  Transportation Needs: No Transportation Needs (08/26/2023)  Utilities: Not At Risk (08/26/2023)  Social Connections: Unknown (08/26/2023)  Tobacco Use: Medium Risk (08/25/2023)   SDOH Interventions:     Readmission  Risk Interventions     No data to display

## 2023-08-28 NOTE — Evaluation (Signed)
 Clinical/Bedside Swallow Evaluation Patient Details  Name: ELAN MCELVAIN MRN: [id number] Date of Birth: [birth year]  Today's Date: 08/28/2023 Time: SLP Start Time (ACUTE ONLY): 1410 SLP Stop Time (ACUTE ONLY): 1510 SLP Time Calculation (min) (ACUTE ONLY): 60 min  Past Medical History:  Past Medical History:  Diagnosis Date   Arthritis    Depression    GERD (gastroesophageal reflux disease)    Heart murmur    History of hiatal hernia    Hypertension    Pyromania    Seizures (HCC)    LAST ONE IN 2002 APPROXIMATELY   Substance abuse (HCC)    Past Surgical History:  Past Surgical History:  Procedure Laterality Date   CHOLECYSTECTOMY N/A 04/08/2016   Procedure: LAPAROSCOPIC CHOLECYSTECTOMY WITH INTRAOPERATIVE CHOLANGIOGRAM;  Surgeon: Ozell DELENA Mano, MD;  Location: [facility];  Service: General;  Laterality: N/A;   COLONOSCOPY     COLONOSCOPY WITH PROPOFOL  N/A 11/18/2016   Procedure: COLONOSCOPY WITH PROPOFOL ;  Surgeon: Ruel Kung, MD;  Location: [facility];  Service: Endoscopy;  Laterality: N/A;   ESOPHAGOGASTRODUODENOSCOPY N/A 04/08/2016   Procedure: ESOPHAGOGASTRODUODENOSCOPY (EGD);  Surgeon: Ozell DELENA Mano, MD;  Location: [facility];  Service: General;  Laterality: N/A;   FOOT SURGERY Bilateral    LAPAROSCOPY N/A 04/08/2016   Procedure: LAPAROSCOPY DIAGNOSTIC;  Surgeon: Ozell DELENA Mano, MD;  Location: [facility];  Service: General;  Laterality: N/A;   HPI:  Pt is a 68 y/o with h/o HTN, tardive dyskinesia, vague history of seizure disorder, psychiatric disorders - depression, schizophrenia, pyromania. She is Cachectic w/ BMI of 12.88 per chart. She took Haldol  for many years and developed tardive diskinesia but has been of haldol  and risperidone  for >10 years. She has resided in a long term care facility for 20 years. . Over the past year she has slowly declined: losing weight, hair turned gray, she had behavior problems. For  two months she has had a progressive decline and over the  past several weeks has become withdrawn and less communicative, decreased PO intake, worsening weight loss, and progressive decline in ability to manage ADLs and ambulate.   CT Chest Imaging: Lungs/Pleura: No suspicious pulmonary nodule or mass. Left base  atelectasis with small left pleural effusion.  Large hiatal hernia.    Assessment / Plan / Recommendation  Clinical Impression   Pt seen for BSE. pt awake, verbal to answer basic questions re: self and what she wanted to eat/drink. She also gave a head nod/shake when answering. Soft voice. Speech garbled w/ reduced intelligibility. SEVERE involuntary head/upper body movements from the tardive dyskinesia.   Pt appears to present w/ MOD-SEVERE oropharyngeal phase Dysphagia w/ oropharyngeal phase weakness and dysmotility noted, neuromuscular deficits noted. Pt consumed 2 po trials of Nectar liquid via TSP and puree (1 each) w/ No immediate, overt clinical s/s of aspiration during po trials - no coughing nor decline in respiratory status. However, pt presents w/ challenging factors that could impact her oropharyngeal swallowing to include SEVERE involuntary head/upper body movements from the tardive dyskinesia, deconditioning/weakness, and dependence for feeding. These factors can increase risk for aspiration, dysphagia as well as decreased oral intake overall.   During the limited po trials consumed, pt demonstrated no immediate overt coughing, decline in vocal quality, nor change in respiratory presentation during/post trials. Oral phase was c/b deficits of lengthy and effortful bolus management and A-P transfer; control of boluses was functional. Oral clearing achieved b/t the trials. She declined further.  OM Exam was cursory d/t apparent discomfort  when making contact on lips/tongue(reddened). Limited lingual ROM noted; decreased tongue movement from retracted position noted.   Post discussion w/ pt/brother then MD, the decision was to allow a  Dysphagia level 1 diet w/ Nectar liquids via Spoon IF pt desired -- to attempt po intake. Recommend strict aspiration precautions, full feeding assistance w/ Time b/t bites/sips for oral clearing. STOP feeding if s/s of aspiration noted. Alternate food/liquid trials. Oral care frequently. Pills would be CRUSHED in Puree when ready.  Much Education given on swallowing/dysphagia, food consistencies and Nectar liquids, strict aspiration precautions; feeding support; monitoring her cues/wishes to pt and brother. NSG updated. MD updated, agreed. Recommend Dietician f/u for support. Palliative Care following.  ST services will f/u w/ toleration of diet next 1-2 days. Brother agreed. SLP Visit Diagnosis: Dysphagia, oropharyngeal phase (R13.12) (MOD-SEVERE)    Aspiration Risk  Moderate aspiration risk;Risk for inadequate nutrition/hydration    Diet Recommendation   Nectar;Dysphagia 1 (puree) (if desires) = Dysphagia level 1 diet w/ Nectar liquids via Spoon IF pt desired -- to attempt po intake. Recommend strict aspiration precautions, full feeding assistance w/ Time b/t bites/sips for oral clearing. Alternate food/liquid trials. Oral care frequently. STOP feeding if s/s of aspiration noted.  Medication Administration: Crushed with puree (when appropriate)    Other  Recommendations Recommended Consults:  (Palliative Care; Dietician) Oral Care Recommendations: Oral care BID;Oral care before and after PO;Staff/trained caregiver to provide oral care Caregiver Recommendations: Avoid jello, ice cream, thin soups, popsicles;Remove water pitcher;Have oral suction available    Recommendations for follow up therapy are one component of a multi-disciplinary discharge planning process, led by the attending physician.  Recommendations may be updated based on patient status, additional functional criteria and insurance authorization.  Follow up Recommendations Follow physician's recommendations for discharge plan and  follow up therapies      Assistance Recommended at Discharge  FULL  Functional Status Assessment Patient has had a recent decline in their functional status and/or demonstrates limited ability to make significant improvements in function in a reasonable and predictable amount of time  Frequency and Duration min 2x/week  2 weeks       Prognosis Prognosis for improved oropharyngeal function: Guarded Barriers to Reach Goals: Cognitive deficits;Language deficits;Time post onset;Severity of deficits;Behavior Barriers/Prognosis Comment: SEVERE involuntary head/upper body movements from the tardive dyskinesia.      Swallow Study   General Date of Onset: 08/26/23 HPI: Pt is a 68 y/o with h/o HTN, tardive dyskinesia, vague history of seizure disorder, psychiatric disorders - depression, schizophrenia, pyromania. She is Cachectic w/ BMI of 12.88 per chart. She took Haldol  for many years and developed tardive diskinesia but has been of haldol  and risperidone  for >10 years. She has resided in a long term care facility for 20 years. . Over the past year she has slowly declined: losing weight, hair turned gray, she had behavior problems. For  two months she has had a progressive decline and over the past several weeks has become withdrawn and less communicative, decreased PO intake, worsening weight loss, and progressive decline in ability to manage ADLs and ambulate.   CT Chest Imaging: Lungs/Pleura: No suspicious pulmonary nodule or mass. Left base  atelectasis with small left pleural effusion.  Large hiatal hernia. Type of Study: Bedside Swallow Evaluation Previous Swallow Assessment: none? Diet Prior to this Study: NPO (poor intake prior to admit) Temperature Spikes Noted: No (wbc 5.5) Respiratory Status: Room air History of Recent Intubation: No Behavior/Cognition: Alert;Cooperative;Pleasant mood;Requires cueing (SEVERE involunary movements)  Oral Cavity Assessment: Dried secretions;Dry (cracked  lips) Oral Care Completed by SLP: Yes (attempted - uncomfortable to pt) Oral Cavity - Dentition: Missing dentition (few) Vision:  (n/a) Self-Feeding Abilities: Total assist Patient Positioning: Upright in bed (MAX assist) Baseline Vocal Quality: Low vocal intensity Volitional Cough: Cognitively unable to elicit Volitional Swallow: Unable to elicit    Oral/Motor/Sensory Function Overall Oral Motor/Sensory Function: Generalized oral weakness (slight open mouth posture)   Ice Chips Ice chips: Not tested   Thin Liquid Thin Liquid: Not tested    Nectar Thick Nectar Thick Liquid: Impaired Presentation: Spoon (x1) Oral Phase Impairments: Reduced labial seal;Reduced lingual movement/coordination Oral phase functional implications: Prolonged oral transit (effortful) Pharyngeal Phase Impairments:  (effortful swallow) Other Comments: no coughing.  declined further   Honey Thick Honey Thick Liquid: Not tested   Puree Puree: Impaired Presentation: Spoon (x1) Oral Phase Impairments: Reduced labial seal;Reduced lingual movement/coordination Oral Phase Functional Implications: Prolonged oral transit (effort) Pharyngeal Phase Impairments:  (effortful swallow) Other Comments: same as w/ nectar trial   Solid     Solid: Not tested        Comer Portugal, MS, CCC-SLP Speech Language Pathologist Rehab Services; [facility] [place] [phone] (ascom) Ieesha Abbasi 08/28/2023,5:29 PM

## 2023-08-28 NOTE — Progress Notes (Signed)
 Physical Therapy Treatment Patient Details Name: Tracey Wyatt MRN: [id number] DOB: August 14, 1955 Today's Date: 08/28/2023   History of Present Illness presented to ER from group home and admitted secondary to progressive functional decline, unable to care for self, eat or drink    PT Comments  Patient supine in bed upon arrival, brother at bedside. Patient agrees to PT/OT tx session, however patient difficulty maintaining eyes open due to facial dyskinesia.  Patient able to sit on EOB with Min A +2. Pt request to use bathroom upon standing, bedside commode in room. Patient able 10 ft total to/from [place] with Min A +2 with bilat HHA. Patient able to follow simple commands, pt conversant minimally and very garbled speech therefore difficult to understand. Increased time and effort required for movements due to dyskinesia noted. Patient tolerated session well overall, and will continue to benefit from skilled acute PT services to address deficits and progress functional mobility. Patient left supine in bed, with bed alarm set, and brother remains at bedside. Will continue to follow acutely.    If plan is discharge home, recommend the following: Two people to help with walking and/or transfers;Two people to help with bathing/dressing/bathroom   Can travel by private vehicle     No  Equipment Recommendations  Other (comment) (TBD at next level of care)    Recommendations for Other Services       Precautions / Restrictions Precautions Precautions: Fall Precaution Comments: NPO Restrictions Weight Bearing Restrictions Per Provider Order: No     Mobility  Bed Mobility Overal bed mobility: Needs Assistance Bed Mobility: Supine to Sit, Sit to Supine     Supine to sit: Min assist, +2 for physical assistance Sit to supine: Contact guard assist   General bed mobility comments: Patient require more assist with supine > sit, +2 assist at trunk and LE's. Increased time. At end of session,  patient able to return from supine > seated with CGA, cuse for sequencing.    Transfers Overall transfer level: Needs assistance   Transfers: Sit to/from Stand Sit to Stand: Min assist, +2 physical assistance           General transfer comment: Pt able to stand from EOB with Min A +2 via Bilat HHA. Did not attempt wtih AD this date due to severe dyskinesias.    Ambulation/Gait Ambulation/Gait assistance: Min assist, +2 physical assistance Gait Distance (Feet): 10 Feet Assistive device: 2 person hand held assist         General Gait Details: Pt able to ambulate from bed to [facility] with Min A +2 with HHA bilaterally. At times progressing to [facility] of one therapist. Patient require increased assist with turns.   Stairs             Wheelchair Mobility     Tilt Bed    Modified Rankin (Stroke Patients Only)       Balance Overall balance assessment: Needs assistance Sitting-balance support: Feet supported, No upper extremity supported Sitting balance-Leahy Scale: Fair     Standing balance support: During functional activity, Bilateral upper extremity supported Standing balance-Leahy Scale: Poor Standing balance comment: Poor balance due to dyskinesia, increased assist required to maintain balance fluctuating from CGA to MIN A                            Cognition Arousal: Lethargic Behavior During Therapy: WFL for tasks assessed/performed Overall Cognitive Status: Difficult to assess  General Comments: Patient follows simple commands with increased time        Exercises      General Comments        Pertinent Vitals/Pain Pain Assessment Pain Assessment: Faces Faces Pain Scale: Hurts a little bit    Home Living                          Prior Function            PT Goals (current goals can now be found in the care plan section) Acute Rehab PT Goals Time For Goal Achievement:  09/10/23 Potential to Achieve Goals: Fair Progress towards PT goals: Progressing toward goals    Frequency    Min 1X/week      PT Plan      Co-evaluation PT/OT/SLP Co-Evaluation/Treatment: Yes Reason for Co-Treatment: Complexity of the patient's impairments (multi-system involvement);For patient/therapist safety PT goals addressed during session: Mobility/safety with mobility;Balance OT goals addressed during session: ADL's and self-care      AM-PAC PT 6 Clicks Mobility   Outcome Measure  Help needed turning from your back to your side while in a flat bed without using bedrails?: A Lot Help needed moving from lying on your back to sitting on the side of a flat bed without using bedrails?: A Lot Help needed moving to and from a bed to a chair (including a wheelchair)?: A Lot Help needed standing up from a chair using your arms (e.g., wheelchair or bedside chair)?: A Lot Help needed to walk in hospital room?: A Lot Help needed climbing 3-5 steps with a railing? : Total 6 Click Score: 11    End of Session   Activity Tolerance: Patient tolerated treatment well Patient left: in bed;with call bell/phone within reach;with bed alarm set;with family/visitor present Nurse Communication: Mobility status PT Visit Diagnosis: Other symptoms and signs involving the nervous system (R29.898)     Time: 8669-8650 PT Time Calculation (min) (ACUTE ONLY): 19 min  Charges:    $Therapeutic Activity: 8-22 mins PT General Charges $$ ACUTE PT VISIT: 1 Visit                     Carolyn CHRISTELLA Kingfisher, PT, DPT 08/28/23 2:17 PM

## 2023-08-28 NOTE — Progress Notes (Signed)
 PROGRESS NOTE    Tracey Wyatt   FMW:[id number] DOB: October 19, 1955  DOA: 08/25/2023 Date of Service: 08/28/23 which is hospital day 2  PCP: Alois Charm, MD (Inactive)    Hospital course / significant events:   HPI: Tracey Wyatt, a 68 y/o with h/o HTN, tardive dyskinesia, vague history of seizure disorder, psychiatric disorders - depression, schizophrenia, pyromania. She took haldol  for many years and developed tardive diskinesia but has been of haldol  and risperidone  for >10 years. She has resided in a long term care facility for 20 years. . Over the past year she has slowly declined: losing weight, hair turned gray, she had behavior problems. For  two months she has had a progressive decline and over the past several weeks has become withdrawn and less communicative, decreased PO intake, worsening weight loss, and progressive decline in ability to manage ADLs and ambulate. Her brother is the primary historian and has been involved with her care for years.   Detailed neurology consult note 02/07 for other hisory:   While she was treated with Haldol  and risperidone  for many years they reports she has been off of any of those medications for at least 1 or 2 decades.  Unfortunately she has had progressive decline in her cognitive abilities as well as weight loss for the past year despite eating well   Due to her weight loss they were encouraging her to eat more and noted that she was still eating well 3 weeks ago although she was more slouched over fatigued appearing.  Now for the past 3 weeks she has hardly been able to eat and not walking well   Additionally they note that 6 - 8 weeks ago she was noted to be more aggressive with the staff hitting etc. and thinks some medication changes may have been made at the time, but they are unclear on details  She was seen in [facility] 08/24/23 for these problems. Her workup including labs, CT brain, U/A were unrevealing. She was returned to her  long term care facility. She now returns to [facility] with worsening symptoms, essentially non-communicative.    02/04: to ED 02/05: admitted to hospitalist service, unable to get EEG until tomorrow. Nonverbal, somnolent.  02/06: More awake today but disoriented, slow/difficult speech. SLP, PT, OT to see. EEG technically difficult study suggestive of mild diffuse encephalopathy. Neuro consult ordered. Qualifies for SNF rehab but concern for po intake 02/07: neuro recs -    Consultants:  Neurology   Procedures/Surgeries: none      ASSESSMENT & PLAN:   Change in mental status Progressive movement disorder Questionable seizure history  Given the report of some new behavior changes in the last 6 to 8 weeks, suspect worsening may be medication related.  Ddx would also include advancement of underlying dementia, psychiatric disorder Very much appreciate neurology evaluation  Investigate reversible causes:  B1, B3, B6, B12, MMA, folate, HIV, RPR, TSH, Ammonia, ESR, CRP, Vit D, thiamine  for reversible causes workup (ordered, reassuring so far other than minimal CRP elevation of uncertain significance), CK (WNL) UDS add on if possible (ordered), Other treatable causes of chorea screening: ANA w/ reflex, SPEP and IFE with 4 times daily, 24-hour urine UPEP/U IFE/light chains/total protein, serum copper , 24-hour urine copper   Investigate for seizure EEG was no concerns Imaging  Currently unable to obtain MRI due to restlessness, likely will be unable to obtain anesthetized MRI at [facility] but would consider obtaining this if she undergoes anesthesia for any other reason  CT chest/abdomen/pelvis to evaluate for occult malignancy which might also explain weight loss  Consider thyroid ultrasound previously recommended in June if thyroid nodule is still present  Medication additions / changes Stop keppra in case this is contributing to mood issues/agitation described Reducing benztropine  from 2  mg tid to 1 mg tid; this medication can at times worsen movement disorders and is associated with cognitive impairment  Clarify medication changes in last 2-3 months from facility, review is pending and appreciate pharmacy assistance 100 mg IV thiamine  daily pending level, may be discontinued if level results normal  Consider reducing Depakote  as well, however it seems she has been on 2 antiseizure medications for some time now and it is unclear when she stopped gabapentin - hesitate to make too many changes simultaneously.   Consider restarting risperidone  to see if this improves movements enough for her to tolerate p.o. intake depending on her clinical course  IV hydration - ok to dc if able to take po per SLP Anticipate outpatient follow up neurology / neuropsych / movement disorder clinic     Protein calorie malnutrition  Long term calories deprivation.  concerned that her weight loss may be primarily secondary to the severity of her movements (caloric needs) SLP to eval now that she's more alert and following commands  Imaging to eval for malignancy  Discussed w/ brother that if po intake is not possible, may need to discuss  enteral feeding - low threshold for palliative involvement pending diagnostics and prognostication   HTN (hypertension) BP well controlled. At this point she is not able to take po medications Hydralazine  2 mg q6 for SBP > 160   Advanced care planning Pt disoriented, not communicative, does not have capacity to make decisions  D/w brother at bedside - biggest concern is her eating, if we are unable to meet nutrition needs, feeding tube may be an option IF based on neurology opinion she has a reasonable chance of recovering function such that she can meet goals QOL. If artificial nutrition will prolong an inevitable decline rather than facilitate recovery, family is not keen on placing a tube. Reasonable to monitor through the weekend to see about po intake and neuro  opinion.  Palliative care to see to discuss plan for best meeting goals    underweight based on BMI: Body mass index is 15.33 kg/m.  Underweight - under 18  overweight - 25 to 29 obese - 30 or more Class 1 obesity: BMI of 30.0 to 34 Class 2 obesity: BMI of 35.0 to 39 Class 3 obesity: BMI of 40.0 to 49 Super Morbid Obesity: BMI 50-59 Super-super Morbid Obesity: BMI 60+ Significantly low or high BMI is associated with higher medical risk.  Weight management advised as adjunct to other disease management and risk reduction treatments    DVT prophylaxis: lovenox   IV fluids: D5 continuous IV fluids  Nutrition: NPO pend SLP Central lines / invasive devices: none  Code Status: DNR confirmed w/ brother  ACP documentation reviewed:  none on file in [facility]  TOC needs: TBD, expect may need LTC possibly rehab Barriers to dispo / significant pending items: neuro workup, NPO               Subjective: Pt unable to contribute much - states no problems, wants to go home Brother at bedside has no additional concerns other than not eating  Denies CP/SOB.    Family Communication: brother at bedside on rounds - se discussion above re advanced care  planning     Objective Findings:  Vitals:   08/27/23 0843 08/27/23 2049 08/28/23 0443 08/28/23 1642  BP: 129/86 (!) 141/83 116/86 (!) 120/95  Pulse: (!) 109 (!) 115 (!) 109 97  Resp: 17 19 19 17   Temp: 98.5 F (36.9 C) 98.1 F (36.7 C) 98 F (36.7 C) 98.3 F (36.8 C)  TempSrc:      SpO2: 98% 99% 99% 100%  Weight:      Height:       No intake or output data in the 24 hours ending 08/28/23 1658  Filed Weights   08/26/23 0030 08/26/23 2147  Weight: 43.1 kg 36.2 kg    Examination:  Physical Exam Constitutional:      General: She is not in acute distress.    Appearance: She is ill-appearing. She is not toxic-appearing.     Comments: frail  Cardiovascular:     Rate and Rhythm: Normal rate and regular rhythm.   Pulmonary:     Effort: Pulmonary effort is normal.     Breath sounds: Normal breath sounds.  Abdominal:     General: Abdomen is flat.     Palpations: Abdomen is soft.  Musculoskeletal:     Right lower leg: No edema.     Left lower leg: No edema.  Skin:    General: Skin is dry.  Neurological:     Mental Status: She is alert. She is disoriented.     Motor: Weakness present.  Psychiatric:        Behavior: Behavior normal.          Scheduled Medications:   aspirin   325 mg Oral Daily   benztropine   1 mg Oral TID   enoxaparin  (LOVENOX ) injection  30 mg Subcutaneous Q24H   ferrous sulfate   15 mg of iron Oral Daily   pantoprazole  (PROTONIX ) IV  40 mg Intravenous QHS   polyethylene glycol  17 g Oral Daily   thiamine  (VITAMIN B1) injection  100 mg Intravenous Daily    Continuous Infusions:  dextrose  5 % and 0.45 % NaCl     valproate sodium  120 mg (08/28/23 1618)    PRN Medications:  hydrALAZINE , LORazepam   Antimicrobials from admission:  Anti-infectives (From admission, onward)    None           Data Reviewed:  I have personally reviewed the following...  CBC: Recent Labs  Lab 08/24/23 1708 08/25/23 2046  WBC 8.7 5.5  NEUTROABS 6.0 3.6  HGB 9.5* 10.6*  HCT 27.9* 31.5*  MCV 88.9 90.0  PLT 189 204   Basic Metabolic Panel: Recent Labs  Lab 08/24/23 1708 08/25/23 1924  NA 137 141  K 3.8 4.0  CL 104 106  CO2 22 26  GLUCOSE 89 95  BUN 41* 39*  CREATININE 0.63 0.50  CALCIUM 8.6* 8.3*   GFR: Estimated Creatinine Clearance: 38.5 mL/min (by C-G formula based on SCr of 0.5 mg/dL). Liver Function Tests: Recent Labs  Lab 08/25/23 1924  AST 16  ALT 14  ALKPHOS 36*  BILITOT 0.8  PROT 5.4*  ALBUMIN 2.7*   No results for input(s): LIPASE, AMYLASE in the last 168 hours. Recent Labs  Lab 08/27/23 1420  AMMONIA 25   Coagulation Profile: No results for input(s): INR, PROTIME in the last 168 hours. Cardiac Enzymes: Recent Labs  Lab  08/28/23 1022  CKTOTAL 169   BNP (last 3 results) No results for input(s): PROBNP in the last 8760 hours. HbA1C: No results for input(s): HGBA1C  in the last 72 hours. CBG: Recent Labs  Lab 08/28/23 0833 08/28/23 1111  GLUCAP 116* 101*   Lipid Profile: No results for input(s): CHOL, HDL, LDLCALC, TRIG, CHOLHDL, LDLDIRECT in the last 72 hours. Thyroid Function Tests: Recent Labs    08/27/23 1420  TSH 0.461   Anemia Panel: Recent Labs    08/27/23 1420  VITAMINB12 827  FOLATE 13.3   Most Recent Urinalysis On File:     Component Value Date/Time   COLORURINE AMBER (A) 08/25/2023 2046   APPEARANCEUR CLEAR (A) 08/25/2023 2046   LABSPEC 1.029 08/25/2023 2046   PHURINE 5.0 08/25/2023 2046   GLUCOSEU NEGATIVE 08/25/2023 2046   HGBUR NEGATIVE 08/25/2023 2046   BILIRUBINUR NEGATIVE 08/25/2023 2046   KETONESUR 5 (A) 08/25/2023 2046   PROTEINUR 30 (A) 08/25/2023 2046   NITRITE NEGATIVE 08/25/2023 2046   LEUKOCYTESUR NEGATIVE 08/25/2023 2046   Sepsis Labs: @LABRCNTIP (procalcitonin:4,lacticidven:4) Microbiology: Recent Results (from the past 240 hours)  Resp panel by RT-PCR (RSV, Flu A&B, Covid) Urine, Catheterized     Status: None   Collection Time: 08/24/23 10:36 PM   Specimen: Urine, Catheterized; Nasal Swab  Result Value Ref Range Status   SARS Coronavirus 2 by RT PCR NEGATIVE NEGATIVE Final    Comment: (NOTE) SARS-CoV-2 target nucleic acids are NOT DETECTED.  The SARS-CoV-2 RNA is generally detectable in upper respiratory specimens during the acute phase of infection. The lowest concentration of SARS-CoV-2 viral copies this assay can detect is 138 copies/mL. A negative result does not preclude SARS-Cov-2 infection and should not be used as the sole basis for treatment or other patient management decisions. A negative result may occur with  improper specimen collection/handling, submission of specimen other than nasopharyngeal swab, presence of viral  mutation(s) within the areas targeted by this assay, and inadequate number of viral copies(<138 copies/mL). A negative result must be combined with clinical observations, patient history, and epidemiological information. The expected result is Negative.  Fact Sheet for Patients:  bloggercourse.com  Fact Sheet for Healthcare Providers:  seriousbroker.it  This test is no t yet approved or cleared by the [place]  FDA and  has been authorized for detection and/or diagnosis of SARS-CoV-2 by FDA under an Emergency Use Authorization (EUA). This EUA will remain  in effect (meaning this test can be used) for the duration of the COVID-19 declaration under Section 564(b)(1) of the Act, 21 U.S.C.section 360bbb-3(b)(1), unless the authorization is terminated  or revoked sooner.       Influenza A by PCR NEGATIVE NEGATIVE Final   Influenza B by PCR NEGATIVE NEGATIVE Final    Comment: (NOTE) The Xpert Xpress SARS-CoV-2/FLU/RSV plus assay is intended as an aid in the diagnosis of influenza from Nasopharyngeal swab specimens and should not be used as a sole basis for treatment. Nasal washings and aspirates are unacceptable for Xpert Xpress SARS-CoV-2/FLU/RSV testing.  Fact Sheet for Patients: bloggercourse.com  Fact Sheet for Healthcare Providers: seriousbroker.it  This test is not yet approved or cleared by the [place]  FDA and has been authorized for detection and/or diagnosis of SARS-CoV-2 by FDA under an Emergency Use Authorization (EUA). This EUA will remain in effect (meaning this test can be used) for the duration of the COVID-19 declaration under Section 564(b)(1) of the Act, 21 U.S.C. section 360bbb-3(b)(1), unless the authorization is terminated or revoked.     Resp Syncytial Virus by PCR NEGATIVE NEGATIVE Final    Comment: (NOTE) Fact Sheet for  Patients: bloggercourse.com  Fact Sheet for Healthcare Providers:  seriousbroker.it  This test is not yet approved or cleared by the [place]  FDA and has been authorized for detection and/or diagnosis of SARS-CoV-2 by FDA under an Emergency Use Authorization (EUA). This EUA will remain in effect (meaning this test can be used) for the duration of the COVID-19 declaration under Section 564(b)(1) of the Act, 21 U.S.C. section 360bbb-3(b)(1), unless the authorization is terminated or revoked.  Performed at [facility], [address] [place], [place] [address]   MRSA Next Gen by PCR, Nasal     Status: None   Collection Time: 08/27/23  6:00 AM   Specimen: Nasal Mucosa; Nasal Swab  Result Value Ref Range Status   MRSA by PCR Next Gen NOT DETECTED NOT DETECTED Final    Comment: (NOTE) The GeneXpert MRSA Assay (FDA approved for NASAL specimens only), is one component of a comprehensive MRSA colonization surveillance program. It is not intended to diagnose MRSA infection nor to guide or monitor treatment for MRSA infections. Test performance is not FDA approved in patients less than 70 years old. Performed at [facility], [address], [place], [place] [address]       Radiology Studies last 3 days: CT CHEST ABDOMEN PELVIS W CONTRAST Result Date: 08/28/2023 CLINICAL DATA:  Weakness. Weight loss. Occult malignancy. * Tracking Code: BO * EXAM: CT CHEST, ABDOMEN, AND PELVIS WITH CONTRAST TECHNIQUE: Multidetector CT imaging of the chest, abdomen and pelvis was performed following the standard protocol during bolus administration of intravenous contrast. RADIATION DOSE REDUCTION: This exam was performed according to the departmental dose-optimization program which includes automated exposure control, adjustment of the mA and/or kV according to patient size and/or use of iterative reconstruction technique. CONTRAST:   OMNIPAQUE  IOHEXOL  300 MG/ML  SOLN COMPARISON:  None Available. FINDINGS: CT CHEST FINDINGS Cardiovascular: The heart size is normal. No substantial pericardial effusion. Mild atherosclerotic calcification is noted in the wall of the thoracic aorta. Aberrant origin right subclavian artery noted. Mediastinum/Nodes: No mediastinal lymphadenopathy. There is no hilar lymphadenopathy. Large hiatal hernia. The esophagus has normal imaging features. There is no axillary lymphadenopathy. Lungs/Pleura: No suspicious pulmonary nodule or mass. Left base atelectasis with small left pleural effusion. Musculoskeletal: No worrisome lytic or sclerotic osseous abnormality. Chronic superior endplate compression deformity noted at T12. CT ABDOMEN PELVIS FINDINGS Hepatobiliary: No suspicious focal abnormality within the liver parenchyma. History of cholecystectomy. No intrahepatic or extrahepatic biliary dilation. Common bile duct measures 4 mm diameter in the pancreatic head. Pancreas: Diffuse parenchymal atrophy without main duct dilatation. Spleen: 2.7 cm homogeneous water density lesion in the posterior spleen has benign appearance, suggesting cyst or pseudocyst. Adrenals/Urinary Tract: 2 cm right adrenal nodule has a relative washout of 58%, most suggestive of benign adenoma. No left adrenal nodule or mass. Kidneys unremarkable. No evidence for hydroureter. Bladder is distended. Stomach/Bowel: Large hiatal hernia. Distal stomach and duodenum are nondilated No small bowel wall thickening. No small bowel dilatation. The terminal ileum is not discretely visualized. The appendix is not well visualized, but there is no edema or inflammation in the region of the cecal tip to suggest appendicitis. Colon is largely decompressed although there is some mild gaseous distension in the hepatic flexure. Prominent rectal stool volume. Vascular/Lymphatic: There is mild atherosclerotic calcification of the abdominal aorta without aneurysm.  There is no gastrohepatic or hepatoduodenal ligament lymphadenopathy. No retroperitoneal or mesenteric lymphadenopathy. No pelvic sidewall lymphadenopathy. Reproductive: Uterus not well visualized. No evidence for right adnexal mass. 5.4 x 3.8 cm unilocular cystic  lesion identified posterior left pelvis. This appears to generate mass-effect on the rectum (see coronal 57/5) suggesting it represents a loculated fluid collection or cyst rather than free fluid in the pelvis. Other: There is diffuse mesenteric and body wall edema. Musculoskeletal: Small gas bubble is identified in the subcutaneous fat of the low anterior midline abdominal wall, presumably an injection site. Markedly heterogeneous mineralization of the sacrum. This is probably related to bilateral insufficiency fractures. Imaging appearance is not entirely characteristic for Paget's disease. Given lack of sclerotic lesions elsewhere, metastatic involvement is considered less likely but not entirely excluded. IMPRESSION: 1. No definite evidence for malignancy in the chest, abdomen, or pelvis. 2. Markedly heterogeneous mineralization of the sacrum. This is probably related to bilateral insufficiency fractures. Imaging appearance is not entirely characteristic for Paget's disease. Given lack of sclerotic lesions elsewhere, metastatic involvement is considered less likely but not entirely excluded. 3. 5.4 x 3.8 cm unilocular cystic lesion posterior left pelvis. This appears to generate mass-effect on the rectum suggesting it represents a loculated fluid collection or cyst rather than free fluid in the pelvis. While not overtly concerning by CT imaging, pelvic ultrasound recommended to further evaluate. 4. Large hiatal hernia. 5. Small left pleural effusion with left base atelectasis. 6. 2 cm right adrenal nodule has a relative washout of 58%, most suggestive of benign adenoma. 7. Diffuse mesenteric and body wall edema. 8.  Aortic Atherosclerosis  (ICD10-I70.0). Electronically Signed   By: Camellia Candle M.D.   On: 08/28/2023 16:45   EEG adult Result Date: 08/27/2023 Shelton Arlin KIDD, MD     08/27/2023  2:36 PM Patient Name: KENNIDI YOSHIDA MRN: [id number] Epilepsy Attending: Arlin KIDD Shelton Referring Physician/Provider: Harlow Ozell BRAVO, MD Date: 08/27/2023 Duration: 25.33 mins Patient history: 68yo female with ams. EEG to evaluate for seizure Level of alertness: Awake AEDs during EEG study: VPA Technical aspects: This EEG study was done with scalp electrodes positioned according to the 10-20 International system of electrode placement. Electrical activity was reviewed with band pass filter of 1-70Hz , sensitivity of 7 uV/mm, display speed of 78mm/sec with a 60Hz  notched filter applied as appropriate. EEG data were recorded continuously and digitally stored.  Video monitoring was available and reviewed as appropriate. Description: No clear posterior dominant rhythm was seen.  EEG showed continuous generalized 5 to 7 Hz theta slowing.  Of note patient was noted to have near continuous movements of mouth and head consistent with her history of tardive dyskinesia's.  Therefore EEG was difficult due to movement artifact.  Hyperventilation and photic stimulation were not performed.   ABNORMALITY - Continuous slow, generalized IMPRESSION: This technically difficult study is suggestive of mild diffuse encephalopathy. No seizures or epileptiform discharges were seen throughout the recording. Arlin KIDD Shelton   DG Abd 1 View Result Date: 08/27/2023 CLINICAL DATA:  Abdominal pain EXAM: ABDOMEN - 1 VIEW COMPARISON:  None Available. FINDINGS: The bowel gas pattern is normal. No radio-opaque calculi or other significant radiographic abnormality are seen. IMPRESSION: Negative. Electronically Signed   By: Franky Stanford M.D.   On: 08/27/2023 04:08   DG Chest Portable 1 View Result Date: 08/24/2023 CLINICAL DATA:  Unresponsive since Friday. EXAM: PORTABLE CHEST 1 VIEW  COMPARISON:  07/24/2023 FINDINGS: Shallow inspiration. Heart size and pulmonary vascularity are normal for technique. Lungs are clear. No pleural effusion. No pneumothorax. Mediastinal contours appear intact. Calcification of the aorta. Degenerative changes in the spine and shoulder. IMPRESSION: No active disease. Electronically Signed   By: Elsie  Mannie M.D.   On: 08/24/2023 22:10   CT HEAD WO CONTRAST ( ) Result Date: 08/24/2023 CLINICAL DATA:  nonfocal altered EXAM: CT HEAD WITHOUT CONTRAST TECHNIQUE: Contiguous axial images were obtained from the base of the skull through the vertex without intravenous contrast. RADIATION DOSE REDUCTION: This exam was performed according to the departmental dose-optimization program which includes automated exposure control, adjustment of the mA and/or kV according to patient size and/or use of iterative reconstruction technique. COMPARISON:  CT head 07/24/2023, CT head 07/07/2023 FINDINGS: Brain: Patchy and confluent areas of decreased attenuation are noted throughout the deep and periventricular white matter of the cerebral hemispheres bilaterally, compatible with chronic microvascular ischemic disease. No evidence of large-territorial acute infarction. No parenchymal hemorrhage. No mass lesion. No extra-axial collection. No mass effect or midline shift. No hydrocephalus. Basilar cisterns are patent. Vascular: No hyperdense vessel. Skull: No acute fracture or focal lesion. Sinuses/Orbits: Paranasal sinuses and mastoid air cells are clear. Bilateral lens replacement. Otherwise the orbits are unremarkable. Other: None. IMPRESSION: No acute intracranial abnormality. Electronically Signed   By: Morgane  Naveau M.D.   On: 08/24/2023 21:43       Time spent: 50 min     Laneta Blunt, DO [facility] 08/28/2023, 4:58 PM    Dictation software may have been used to generate the above note. Typos may occur and escape review in typed/dictated notes. Please  contact Dr Blunt directly for clarity if needed.  Staff may message me via secure chat in Epic  but this may not receive an immediate response,  please page me for urgent matters!  If 7PM-7AM, please contact night coverage [email or web address]

## 2023-08-29 ENCOUNTER — Inpatient Hospital Stay: Payer: Medicare Other

## 2023-08-29 DIAGNOSIS — Z515 Encounter for palliative care: Secondary | ICD-10-CM

## 2023-08-29 DIAGNOSIS — R627 Adult failure to thrive: Secondary | ICD-10-CM

## 2023-08-29 DIAGNOSIS — R410 Disorientation, unspecified: Secondary | ICD-10-CM | POA: Diagnosis not present

## 2023-08-29 DIAGNOSIS — R4182 Altered mental status, unspecified: Secondary | ICD-10-CM | POA: Diagnosis not present

## 2023-08-29 LAB — CBC WITH DIFFERENTIAL/PLATELET
Abs Immature Granulocytes: 0.14 10*3/uL — ABNORMAL HIGH (ref 0.00–0.07)
Basophils Absolute: 0.1 10*3/uL (ref 0.0–0.1)
Basophils Relative: 0 %
Eosinophils Absolute: 0 10*3/uL (ref 0.0–0.5)
Eosinophils Relative: 0 %
HCT: 29.6 % — ABNORMAL LOW (ref 36.0–46.0)
Hemoglobin: 10.3 g/dL — ABNORMAL LOW (ref 12.0–15.0)
Immature Granulocytes: 1 %
Lymphocytes Relative: 2 %
Lymphs Abs: 0.6 10*3/uL — ABNORMAL LOW (ref 0.7–4.0)
MCH: 30.4 pg (ref 26.0–34.0)
MCHC: 34.8 g/dL (ref 30.0–36.0)
MCV: 87.3 fL (ref 80.0–100.0)
Monocytes Absolute: 1.1 10*3/uL — ABNORMAL HIGH (ref 0.1–1.0)
Monocytes Relative: 5 %
Neutro Abs: 21.2 10*3/uL — ABNORMAL HIGH (ref 1.7–7.7)
Neutrophils Relative %: 92 %
Platelets: 273 10*3/uL (ref 150–400)
RBC: 3.39 MIL/uL — ABNORMAL LOW (ref 3.87–5.11)
RDW: 11.5 % (ref 11.5–15.5)
WBC: 23 10*3/uL — ABNORMAL HIGH (ref 4.0–10.5)
nRBC: 0 % (ref 0.0–0.2)

## 2023-08-29 LAB — GLUCOSE, CAPILLARY
Glucose-Capillary: 109 mg/dL — ABNORMAL HIGH (ref 70–99)
Glucose-Capillary: 105 mg/dL — ABNORMAL HIGH (ref 70–99)
Glucose-Capillary: 91 mg/dL (ref 70–99)
Glucose-Capillary: 93 mg/dL (ref 70–99)

## 2023-08-29 LAB — TECHNOLOGIST SMEAR REVIEW: Plt Morphology: NORMAL

## 2023-08-29 LAB — CREATININE, SERUM
Creatinine, Ser: 0.33 mg/dL — ABNORMAL LOW (ref 0.44–1.00)
GFR, Estimated: >60 mL/min (ref >60)

## 2023-08-29 MED ORDER — SODIUM CHLORIDE 0.9 % IV BOLUS
500.0000 mL | Freq: Once | INTRAVENOUS | Status: AC
  Administered 2023-08-29: 500 mL via INTRAVENOUS

## 2023-08-29 MED ORDER — VANCOMYCIN HCL 500 MG/100ML IV SOLN: 500.0000 mg | INTRAVENOUS | Status: DC

## 2023-08-29 MED ORDER — VANCOMYCIN HCL 750 MG IV SOLR
750.0000 mg | Freq: Once | INTRAVENOUS | Status: AC
  Administered 2023-08-29: 750 mg via INTRAVENOUS
  Filled 2023-08-29: qty 750

## 2023-08-29 MED ORDER — SODIUM CHLORIDE 0.9 % IV SOLN
1.0000 g | INTRAVENOUS | Status: DC
  Administered 2023-08-29: 1 g via INTRAVENOUS
  Filled 2023-08-29 (×2): qty 10

## 2023-08-29 MED ORDER — RISPERIDONE 1 MG PO TBDP
0.5000 mg | ORAL_TABLET | Freq: Two times a day (BID) | ORAL | Status: DC
  Filled 2023-08-29: qty 0.5

## 2023-08-29 MED ORDER — DEXTROSE-SODIUM CHLORIDE 5-0.45 % IV SOLN: INTRAVENOUS | Status: AC

## 2023-08-29 MED ORDER — OLANZAPINE 10 MG IM SOLR
2.5000 mg | Freq: Two times a day (BID) | INTRAMUSCULAR | Status: DC
  Administered 2023-08-29 – 2023-09-04 (×7): 2.5 mg via INTRAMUSCULAR
  Filled 2023-08-29 (×12): qty 10

## 2023-08-29 NOTE — Consult Note (Signed)
 Consultation Note Date: 08/29/2023   Patient Name: Tracey Wyatt Wyatt  DOB: [birth year]  MRN: [id number]  Age / Sex: 68 y.o., female  PCP: Tracey Charm, MD (Inactive) Referring Physician: Marsa Edelman, DO  Reason for Consultation: Establishing goals of care   HPI/Brief Hospital Course: 68 y.o. female  with past medical history of mood disorder since early childhood, progressive movement disorder, HTN, HLD, dilated cardiomyopathy and possible seizure disorder admitted from [facility] on 08/25/2023 with progressive functional and mental decline over last several months with most recent decline becoming non-verbal and somnolent. Poor PO intake for several months and within last several weeks, intake has been minimal. Also noted increase in behavioral problems at facility recently.  Neurology following closely for significant decompensation of chronic progressive movement disorder--medications being adjusted, labs.testing being obtained for possible reversible causes, concern for possible underlying malignancy due to poor PO intake and weight loss--unable to obtain MRI due to movement disorder, would require sedation  Discussions being had regarding potential for recovery with need to consider PEG tube placement if possible to improve function and QOL versus considering comfort care/hospice care if recovery felt to be unlikely   Palliative medicine was consulted for assisting with goals of care conversations.  Subjective:  Extensive chart review has been completed prior to meeting patient including labs, vital signs, imaging, progress notes, orders, and available advanced directive documents from current and previous encounters.  Visited with Tracey Wyatt Wyatt at her bedside. She is resting in bed with eyes closed, will open eyes to calling of her name, unable to communicate or follow commands, easily drifts back to sleep without redirection.  Brother-Tracey Wyatt at bedside during time of visit.  Introduced myself as a publishing rights manager as a member of the palliative care team. Explained palliative medicine is specialized medical care for people living with serious illness. It focuses on providing relief from the symptoms and stress of a serious illness. The goal is to improve quality of life for both the patient and the family.   Tracey Wyatt Wyatt shares he and his brother-Tracey Wyatt Wyatt are the only family Tracey Wyatt Wyatt has. Tracey Wyatt Wyatt is appointed legal guardian but ultimately they make decisions together for their sister. Tracey Wyatt Wyatt shares Tracey Wyatt Wyatt has been a resident at [facility] for over 20 years, prior to this their mother was her primary caretaker in their home.  We discussed plan of care, assessing for potential reversible causes of progressive decline.  We discussed patient's current illness and what it means in the larger context of patient's on-going co-morbidities. Natural disease trajectory and expectations at EOL were discussed.   We also discussed the possible need for PEG tube if recovery is thought to be possible and providing artificial nutrition would improve QOL.  Returned to bedside to visit with brother-Tracey Wyatt Wyatt, we discussed above conversations and he agrees with plan to allow time for results and further discussions regarding QOL.  Tracey Wyatt Wyatt has asked that I return tomorrow morning to have GOC discussions with his sister in law who works in dealer.  I discussed importance of continued conversations with family/support persons and all members of their medical team regarding overall plan of care and treatment options ensuring decisions are in alignment with patients goals of care.  All questions/concerns addressed. Emotional support provided to patient/family/support persons. PMT will continue to follow and support patient as needed.  Objective: Primary Diagnoses: Present on Admission:  Dementia (HCC)  Altered mental  status   Physical Exam Constitutional:      General: She  is not in acute distress.    Appearance: She is ill-appearing.  Pulmonary:     Effort: Pulmonary effort is normal. No respiratory distress.  Skin:    General: Skin is warm and dry.     Vital Signs: BP 111/74 (BP Location: Right Arm)   Pulse (!) 105   Temp 97.8 F (36.6 C)   Resp 18   Ht 5' 6 (1.676 m)   Wt 36.2 kg   SpO2 100%   BMI 12.88 kg/m  Pain Scale: Faces   Pain Score: 0-No pain  IO: Intake/output summary:  Intake/Output Summary (Last 24 hours) at 08/29/2023 1328 Last data filed at 08/29/2023 0600 Gross per 24 hour  Intake 1010.12 ml  Output --  Net 1010.12 ml    LBM: Last BM Date :  (unknown) Baseline Weight: Weight: 43.1 kg Most recent weight: Weight: 36.2 kg      Assessment and Plan  SUMMARY OF RECOMMENDATIONS   Time for outcomes/results to continue GOC discussion, importance of QOL PMT to continue to follow for ongoing needs and support  Thank you for this consult and allowing Palliative Medicine to participate in the care of Tracey Wyatt Wyatt. Palliative medicine will continue to follow and assist as needed.   Time Total: 75 minutes  Time spent includes: Detailed review of medical records (labs, imaging, vital signs), medically appropriate exam (mental status, respiratory, cardiac, skin), discussed with treatment team, counseling and educating patient, family and staff, documenting clinical information, medication management and coordination of care.   Signed by: Tracey Wyatt Lesches, DNP, AGNP-C Palliative Medicine    Please contact Palliative Medicine Team phone at [phone] for questions and concerns.  For individual provider: See Tracey Wyatt

## 2023-08-29 NOTE — Progress Notes (Signed)
 Pharmacy Antibiotic Note  Tracey Wyatt is a 68 y.o. female admitted on 08/25/2023 with  altered mental status . Patient has new leukocytosis and is tachycardic but remains afebrile. Pharmacy has been consulted for vancomycin  dosing.  Plan: Give vancomycin  load of 750 mg IV x1 Start vancomycin  500 mg IV every 24 hours (eAUC 528.1, Scr 0.8, TBW used, Vd 0.72 L/kg) Monitor renal function, clinical status, culture data, and LOT  Height: 5' 6 (167.6 cm) Weight: 36.2 kg (79 lb 12.9 oz) IBW/kg (Calculated) : 59.3  Temp (24hrs), Avg:98.4 F (36.9 C), Min:97.8 F (36.6 C), Max:99.6 F (37.6 C)  Recent Labs  Lab 08/24/23 1708 08/25/23 1924 08/25/23 2046 08/29/23 1143  WBC 8.7  --  5.5 23.0*  CREATININE 0.63 0.50  --   --     Estimated Creatinine Clearance: 38.5 mL/min (by C-G formula based on SCr of 0.5 mg/dL).    Allergies  Allergen Reactions   A+D Diaper Rash [Diaper Rash Products]    Morphine Other (See Comments)    Other Reaction: Not Assessed   Procaine Other (See Comments)   Antimicrobials this admission: Vancomycin  2/8 >>   Dose adjustments this admission: N/A  Microbiology results: 2/8 BCx: pending  Thank you for involving pharmacy in this patient's care.   Damien Napoleon, PharmD Clinical Pharmacist 08/29/2023 5:55 PM

## 2023-08-29 NOTE — Progress Notes (Signed)
 RN was unable to I and O cath for urine sample. NT and RN attempted times 2. Secure chat with provider at 4:35 pm, purwick in place for urine sample. At 6:30 pm rechecked patient, No urine output, provider notified and ordered IVF. RN hung IVF . Report given to oncoming RN to send urine sample to lab.

## 2023-08-29 NOTE — Progress Notes (Signed)
 NEUROLOGY CONSULT FOLLOW UP NOTE   Date of service: August 29, 2023 Patient Name: Tracey Wyatt MRN:  [id number] DOB:  11-27-1955   Interval Hx/subjective   No significant change in abnormal movements.  She is having difficulty eating  Vitals   Vitals:   08/29/23 0231 08/29/23 0528 08/29/23 0600 08/29/23 0846  BP:  116/72  111/74  Pulse: 78 (!) 111 98 (!) 105  Resp:    18  Temp:  98.5 F (36.9 C)  97.8 F (36.6 C)  TempSrc:      SpO2:  99%  100%  Weight:      Height:         Body mass index is 12.88 kg/m.  Physical Exam   General: In bed, keeps eyes tightly shut  Neurologic Examination   MS: She is awake, but with eyes tightly shut.  She is able to follow commands, tell me her name, that she is in the hospital.  She has significant difficulty with initiating speech. CN: Eyes are midline, not deviated she has significant abnormal facial movements Motor: She has constant choreiform movements. Sensory: She endorses bilateral sensation  Labs and Diagnostic Imaging   CBC:  Recent Labs  Lab 08/25/23 2046 08/29/23 1143  WBC 5.5 23.0*  NEUTROABS 3.6 21.2*  HGB 10.6* 10.3*  HCT 31.5* 29.6*  MCV 90.0 87.3  PLT 204 273    Basic Metabolic Panel:  Lab Results  Component Value Date   NA 141 08/25/2023   K 4.0 08/25/2023   CO2 26 08/25/2023   GLUCOSE 95 08/25/2023   BUN 39 (H) 08/25/2023   CREATININE 0.50 08/25/2023   CALCIUM 8.3 (L) 08/25/2023   GFRNONAA >60 08/25/2023   GFRAA >60 04/03/2016   Impression   [name] is a 68 y.o. 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   She has a history of significant psychiatric issues including schizophrenia.  It is possible that these movements are tardive in nature, in fact I suspect so.   Additionally, it is not clear that she truly has a significant seizure history.  As Keppra can be associated with mood disturbances and given her  significant psychiatric history I do favor stopping the Keppra for now.   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    Recommendations  Olanzapine  2.5 mg IM twice daily Continue benztropine  1 mg 3 times daily Continue Depacon  120mg  every 8 hours Neurology to follow ______________________________________________________________________   Thank you for the opportunity to take part in the care of this patient. If you have any further questions, please contact the neurology consultation team on call. Updated oncall schedule is listed on AMION.  Signed,  Aisha Seals, MD [facility] [phone]  If 7pm- 7am, please page neurology on call as listed in AMION.

## 2023-08-29 NOTE — Plan of Care (Addendum)
 The patient was not able to tolerate oral medications today, RN notified Dr. Marsa for awareness. Speech pathology at bedside. (See note)  Problem: Education: Goal: Knowledge of General Education information will improve Description: Including pain rating scale, medication(s)/side effects and non-pharmacologic comfort measures Outcome: Progressing   Problem: Health Behavior/Discharge Planning: Goal: Ability to manage health-related needs will improve Outcome: Progressing   Problem: Clinical Measurements: Goal: Ability to maintain clinical measurements within normal limits will improve Outcome: Progressing Goal: Will remain free from infection Outcome: Progressing Goal: Diagnostic test results will improve Outcome: Progressing Goal: Respiratory complications will improve Outcome: Progressing Goal: Cardiovascular complication will be avoided Outcome: Progressing   Problem: Activity: Goal: Risk for activity intolerance will decrease Outcome: Progressing   Problem: Nutrition: Goal: Adequate nutrition will be maintained Outcome: Progressing   Problem: Coping: Goal: Level of anxiety will decrease Outcome: Progressing   Problem: Elimination: Goal: Will not experience complications related to bowel motility Outcome: Progressing Goal: Will not experience complications related to urinary retention Outcome: Progressing   Problem: Pain Managment: Goal: General experience of comfort will improve and/or be controlled Outcome: Progressing   Problem: Safety: Goal: Ability to remain free from injury will improve Outcome: Progressing   Problem: Skin Integrity: Goal: Risk for impaired skin integrity will decrease Outcome: Progressing

## 2023-08-29 NOTE — Progress Notes (Signed)
 Speech Language Pathology Treatment: Dysphagia  Patient Details Name: Tracey Wyatt MRN: [id number] DOB: 1955-10-29 Today's Date: 08/29/2023 Time: 8799-8759 SLP Time Calculation (min) (ACUTE ONLY): 40 min  Assessment / Plan / Recommendation Clinical Impression  Pt seen for dysphagia intervention following bedside swallow assessment 2/7. Pt with persistent tardive dyskinesia/chorea impacting head/face and limiting attempts at oral care and voluntary control of PO trials. Oral care completed with assistance of RN. Lips were significantly scabbed with dried blood tinged secretions across lips. Thick, dark secretions lined top of palate, with limited access/removal with suction given oral movements. Pt with weak reflexive cough x1 during suctioning, with minimal secretions appreciated. RN reported that she did not feel comfortable attempting oral medication based on lack of command following and oral control.  Limited trials completed of ice chip and nectar thick liquids (each x1). Pt demonstrating no volitional movements to control/manipulate bolus. Liquid administered at anterior portion of mouth without lingual propulsion for posterior movement. Anterior loss noted, with what remained in oral cavity likely passively progressing with suspected delayed swallow initiation. This is a notable contrast to reported presentation during eval on 2/7. Suspect that severity of dyskinesia has direct impact on oral function and is contributing to current decline in oral phase of swallow. Neurologist present for portion of session and reported plan for initiating medication to aid dyskinesia- which will also hopefully aid safety with PO intake.   Education shared to family (brother) in room regarding to importance of oral care and rationale for current diet restrictions. Family reported understanding. SLP spoke with RN and MD regarding administering medication via alternative means based on inconsistency in  presentation and risk of aspiration. Tentatively recommend maintaining current diet recommendations (nectar and puree via spoon) with continued STRICT aspiration precautions. Recommend STAFF complete feeding to ensure pt is appropriate for PO intake. Frequent oral care. SLP will continue to follow.    HPI HPI: Pt is a 68 y/o with h/o HTN, tardive dyskinesia, vague history of seizure disorder, psychiatric disorders - depression, schizophrenia, pyromania. She is Cachectic w/ BMI of 12.88 per chart. She took Haldol  for many years and developed tardive diskinesia but has been of haldol  and risperidone  for >10 years. She has resided in a long term care facility for 20 years. . Over the past year she has slowly declined: losing weight, hair turned gray, she had behavior problems. For  two months she has had a progressive decline and over the past several weeks has become withdrawn and less communicative, decreased PO intake, worsening weight loss, and progressive decline in ability to manage ADLs and ambulate.   CT Chest Imaging: Lungs/Pleura: No suspicious pulmonary nodule or mass. Left base  atelectasis with small left pleural effusion.  Large hiatal hernia.      SLP Plan  Continue with current plan of care      Recommendations for follow up therapy are one component of a multi-disciplinary discharge planning process, led by the attending physician.  Recommendations may be updated based on patient status, additional functional criteria and insurance authorization.    Recommendations  Diet recommendations: Nectar-thick liquid;Dysphagia 1 (puree) (tentative) Liquids provided via: Teaspoon Medication Administration: Via alternative means Supervision: Staff to assist with self feeding;Full supervision/cueing for compensatory strategies Compensations: Minimize environmental distractions;Slow rate;Small sips/bites                  Oral care BID;Oral care before and after PO;Staff/trained caregiver to  provide oral care   Frequent or constant Supervision/Assistance Dysphagia,  oropharyngeal phase (R13.12)     Continue with current plan of care    Tracey Maiorana Clapp, MS, CCC-SLP Speech Language Pathologist Rehab Services; [facility] [phone] (ascom)   Saadiq Poche J Wyatt  08/29/2023, 12:41 PM

## 2023-08-29 NOTE — Progress Notes (Signed)
 PROGRESS NOTE    Tracey Wyatt   FMW:[id number] DOB: Aug 22, 1955  DOA: 08/25/2023 Date of Service: 08/29/23 which is hospital day 3  PCP: Alois Charm, MD (Inactive)    Hospital course / significant events:   HPI: Tracey Wyatt, a 68 y/o with h/o HTN, tardive dyskinesia, vague history of seizure disorder, psychiatric disorders - depression, schizophrenia, pyromania. She took haldol  for many years and developed tardive diskinesia but has been of haldol  and risperidone  for >10 years. She has resided in a long term care facility for 20 years. . Over the past year she has slowly declined: losing weight, hair turned gray, she had behavior problems. For  two months she has had a progressive decline and over the past several weeks has become withdrawn and less communicative, decreased PO intake, worsening weight loss, and progressive decline in ability to manage ADLs and ambulate. Her brother is the primary historian and has been involved with her care for years.   Detailed neurology consult note 02/07 for other hisory:   While she was treated with Haldol  and risperidone  for many years they reports she has been off of any of those medications for at least 1 or 2 decades.  Unfortunately she has had progressive decline in her cognitive abilities as well as weight loss for the past year despite eating well   Due to her weight loss they were encouraging her to eat more and noted that she was still eating well 3 weeks ago although she was more slouched over fatigued appearing.  Now for the past 3 weeks she has hardly been able to eat and not walking well   Additionally they note that 6 - 8 weeks ago she was noted to be more aggressive with the staff hitting etc. and thinks some medication changes may have been made at the time, but they are unclear on details  She was seen in [facility] 08/24/23 for these problems. Her workup including labs, CT brain, U/A were unrevealing. She was returned to her  long term care facility. She now returns to [facility] with worsening symptoms, essentially non-communicative.    02/04: to ED 02/05: admitted to hospitalist service, unable to get EEG until tomorrow. Nonverbal, somnolent.  02/06: More awake today but disoriented, slow/difficult speech. SLP, PT, OT to see. EEG technically difficult study suggestive of mild diffuse encephalopathy. Neuro consult ordered. Qualifies for SNF rehab but concern for po intake 02/07: awake and interactive today, not eating much. Neuro recs as below - w/u movement d/o for reversible causes, reviewing meds for possible reversible causes. See A/P for full discussion - if feeding tube would provide nutrition while treatment takes time to help her chorea, then this would be reasonable but if the condition is expected to stay stable or deteriorate family would not want feeding tube and will consider comfort measures 02/08: more somnolent today and not eating. Palliative to see - see consult note. WBC up, infectious w/u and abx.    Consultants:  Neurology   Procedures/Surgeries: none      ASSESSMENT & PLAN:   Change in mental status Progressive movement disorder Questionable seizure history  Given the report of some new behavior changes in the last 6 to 8 weeks, suspect worsening may be medication related.  Ddx would also include advancement of underlying dementia, psychiatric disorder Very much appreciate neurology evaluation  Investigate reversible causes:  PENDING: B1, B3, B6, MMA, UDS add on if possible (ordered) PENDING: Other treatable causes of chorea screening: ANA w/  reflex, SPEP and IFE with 4 times daily, 24-hour urine UPEP/U IFE/light chains/total protein, serum copper , 24-hour urine copper   NORMAL/NR: TSH, RPR, HIV, ammonia, CK, ESR, B12, VItD, folate ABNORMAL:  CRP 13  Investigate for seizure EEG was no concerns Imaging  Currently unable to obtain MRI due to restlessness, likely will be unable to  obtain anesthetized MRI at [facility] but would consider obtaining this if she undergoes anesthesia for any other reason  CT chest/abdomen/pelvis to evaluate for occult malignancy which might also explain weight loss  Consider thyroid ultrasound previously recommended in June if thyroid nodule is still present  Medication additions / changes - managed by neurology, note pt swallowing pills has been a challenge  Stop keppra in case this is contributing to mood issues/agitation described Reducing benztropine  from 2 mg tid to 1 mg tid; this medication can at times worsen movement disorders and is associated with cognitive impairment  Clarify medication changes in last 2-3 months from facility, review is pending and appreciate pharmacy assistance 100 mg IV thiamine  daily pending level, may be discontinued if level results normal  Consider reducing Depakote  as well, however it seems she has been on 2 antiseizure medications for some time now and it is unclear when she stopped gabapentin - hesitate to make too many changes simultaneously.   Consider restarting risperidone  to see if this improves movements enough for her to tolerate p.o. intake depending on her clinical course  IV hydration - ok to dc if able to take po per SLP Anticipate outpatient follow up neurology / neuropsych / movement disorder clinic    Leukocytosis  Tachcyardia SIRS Infectious workup: follow CXR, UA, UCx, BCx,  start broad abx once culture specimens collected   Protein calorie malnutrition  Weight loss Long term calorie deprivation.  concerned that her weight loss may be primarily secondary to the severity of her movements (caloric needs, inability to functionally coordinate eating) Poor reserves confer very low chance of meaningful recovery at this point  SLP following Imaging to eval for malignancy - no malignancy notes Discussed w/ brother that if po intake is not possible, may need to discuss  enteral feeding - low  threshold for palliative involvement pending diagnostics and prognostication   Cystic pelvic mass 3.8 x 5.4 cm, favored benign Possible compressive effect on rectum Stool softeners as needed Consider pelvic US   HTN (hypertension) BP well controlled. At this point she is not able to take po medications Hydralazine  2 mg q6 for SBP > 160   Hiatal Hernia, large PPI  Advanced care planning Pt disoriented, not communicative, does not have capacity to make decisions  D/w brother at bedside - biggest concern is her eating, if we are unable to meet nutrition needs, feeding tube may be an option IF based on neurology opinion she has a reasonable chance of recovering function such that she can meet goals QOL. If artificial nutrition will prolong an inevitable decline rather than facilitate recovery, family is not keen on placing a tube. Reasonable to monitor through the weekend to see about po intake and neuro opinion.  Palliative care to see to discuss plan for best meeting goals    underweight based on BMI: Body mass index is 15.33 kg/m.  Underweight - under 18  overweight - 25 to 29 obese - 30 or more Class 1 obesity: BMI of 30.0 to 34 Class 2 obesity: BMI of 35.0 to 39 Class 3 obesity: BMI of 40.0 to 49 Super Morbid Obesity: BMI 50-59  Super-super Morbid Obesity: BMI 60+ Significantly low or high BMI is associated with higher medical risk.  Weight management advised as adjunct to other disease management and risk reduction treatments    DVT prophylaxis: lovenox   IV fluids: D5 continuous IV fluids  Nutrition: NPO pend SLP Central lines / invasive devices: none  Code Status: DNR confirmed w/ brother  ACP documentation reviewed:  none on file in [facility]  TOC needs: TBD, expect may need LTC possibly rehab Barriers to dispo / significant pending items: neuro workup, infectious workup                Subjective: Pt unable to contribute much - states no problems, wants to go  home Brother at bedside has no additional concerns other than not eating  Denies CP/SOB.    Family Communication: brother at bedside on rounds - se discussion above re advanced care planning     Objective Findings:  Vitals:   08/29/23 0231 08/29/23 0528 08/29/23 0600 08/29/23 0846  BP:  116/72  111/74  Pulse: 78 (!) 111 98 (!) 105  Resp:    18  Temp:  98.5 F (36.9 C)  97.8 F (36.6 C)  TempSrc:      SpO2:  99%  100%  Weight:      Height:        Intake/Output Summary (Last 24 hours) at 08/29/2023 1306 Last data filed at 08/29/2023 0600 Gross per 24 hour  Intake 1010.12 ml  Output --  Net 1010.12 ml    Filed Weights   08/26/23 0030 08/26/23 2147  Weight: 43.1 kg 36.2 kg    Examination:  Physical Exam Constitutional:      General: She is not in acute distress.    Appearance: She is ill-appearing. She is not toxic-appearing.     Comments: frail  Cardiovascular:     Rate and Rhythm: Regular rhythm. Tachycardia present.  Pulmonary:     Effort: Pulmonary effort is normal.     Breath sounds: Normal breath sounds.  Abdominal:     General: Abdomen is flat.     Palpations: Abdomen is soft.  Musculoskeletal:     Right lower leg: No edema.     Left lower leg: No edema.  Skin:    General: Skin is dry.  Neurological:     Motor: Weakness present.  Psychiatric:        Behavior: Behavior normal.          Scheduled Medications:   aspirin   325 mg Oral Daily   benztropine   1 mg Oral TID   enoxaparin  (LOVENOX ) injection  30 mg Subcutaneous Q24H   ferrous sulfate   15 mg of iron Oral Daily   pantoprazole  (PROTONIX ) IV  40 mg Intravenous QHS   polyethylene glycol  17 g Oral Daily   thiamine  (VITAMIN B1) injection  100 mg Intravenous Daily    Continuous Infusions:  dextrose  5 % and 0.45 % NaCl     valproate sodium  120 mg (08/29/23 0504)    PRN Medications:  hydrALAZINE , LORazepam   Antimicrobials from admission:  Anti-infectives (From admission, onward)     None           Data Reviewed:  I have personally reviewed the following...  CBC: Recent Labs  Lab 08/24/23 1708 08/25/23 2046 08/29/23 1143  WBC 8.7 5.5 23.0*  NEUTROABS 6.0 3.6 21.2*  HGB 9.5* 10.6* 10.3*  HCT 27.9* 31.5* 29.6*  MCV 88.9 90.0 87.3  PLT 189 204 273  Basic Metabolic Panel: Recent Labs  Lab 08/24/23 1708 08/25/23 1924  NA 137 141  K 3.8 4.0  CL 104 106  CO2 22 26  GLUCOSE 89 95  BUN 41* 39*  CREATININE 0.63 0.50  CALCIUM 8.6* 8.3*   GFR: Estimated Creatinine Clearance: 38.5 mL/min (by C-G formula based on SCr of 0.5 mg/dL). Liver Function Tests: Recent Labs  Lab 08/25/23 1924  AST 16  ALT 14  ALKPHOS 36*  BILITOT 0.8  PROT 5.4*  ALBUMIN 2.7*   No results for input(s): LIPASE, AMYLASE in the last 168 hours. Recent Labs  Lab 08/27/23 1420  AMMONIA 25   Coagulation Profile: No results for input(s): INR, PROTIME in the last 168 hours. Cardiac Enzymes: Recent Labs  Lab 08/28/23 1022  CKTOTAL 169   BNP (last 3 results) No results for input(s): PROBNP in the last 8760 hours. HbA1C: No results for input(s): HGBA1C in the last 72 hours. CBG: Recent Labs  Lab 08/28/23 0833 08/28/23 1111 08/28/23 2009 08/29/23 0803 08/29/23 1113  GLUCAP 116* 101* 109* 109* 105*   Lipid Profile: No results for input(s): CHOL, HDL, LDLCALC, TRIG, CHOLHDL, LDLDIRECT in the last 72 hours. Thyroid Function Tests: Recent Labs    08/27/23 1420  TSH 0.461   Anemia Panel: Recent Labs    08/27/23 1420  VITAMINB12 827  FOLATE 13.3   Most Recent Urinalysis On File:     Component Value Date/Time   COLORURINE AMBER (A) 08/25/2023 2046   APPEARANCEUR CLEAR (A) 08/25/2023 2046   LABSPEC 1.029 08/25/2023 2046   PHURINE 5.0 08/25/2023 2046   GLUCOSEU NEGATIVE 08/25/2023 2046   HGBUR NEGATIVE 08/25/2023 2046   BILIRUBINUR NEGATIVE 08/25/2023 2046   KETONESUR 5 (A) 08/25/2023 2046   PROTEINUR 30 (A) 08/25/2023 2046    NITRITE NEGATIVE 08/25/2023 2046   LEUKOCYTESUR NEGATIVE 08/25/2023 2046   Sepsis Labs: @LABRCNTIP (procalcitonin:4,lacticidven:4) Microbiology: Recent Results (from the past 240 hours)  Resp panel by RT-PCR (RSV, Flu A&B, Covid) Urine, Catheterized     Status: None   Collection Time: 08/24/23 10:36 PM   Specimen: Urine, Catheterized; Nasal Swab  Result Value Ref Range Status   SARS Coronavirus 2 by RT PCR NEGATIVE NEGATIVE Final    Comment: (NOTE) SARS-CoV-2 target nucleic acids are NOT DETECTED.  The SARS-CoV-2 RNA is generally detectable in upper respiratory specimens during the acute phase of infection. The lowest concentration of SARS-CoV-2 viral copies this assay can detect is 138 copies/mL. A negative result does not preclude SARS-Cov-2 infection and should not be used as the sole basis for treatment or other patient management decisions. A negative result may occur with  improper specimen collection/handling, submission of specimen other than nasopharyngeal swab, presence of viral mutation(s) within the areas targeted by this assay, and inadequate number of viral copies(<138 copies/mL). A negative result must be combined with clinical observations, patient history, and epidemiological information. The expected result is Negative.  Fact Sheet for Patients:  bloggercourse.com  Fact Sheet for Healthcare Providers:  seriousbroker.it  This test is no t yet approved or cleared by the [place]  FDA and  has been authorized for detection and/or diagnosis of SARS-CoV-2 by FDA under an Emergency Use Authorization (EUA). This EUA will remain  in effect (meaning this test can be used) for the duration of the COVID-19 declaration under Section 564(b)(1) of the Act, 21 U.S.C.section 360bbb-3(b)(1), unless the authorization is terminated  or revoked sooner.       Influenza A by PCR NEGATIVE NEGATIVE Final  Influenza B by PCR  NEGATIVE NEGATIVE Final    Comment: (NOTE) The Xpert Xpress SARS-CoV-2/FLU/RSV plus assay is intended as an aid in the diagnosis of influenza from Nasopharyngeal swab specimens and should not be used as a sole basis for treatment. Nasal washings and aspirates are unacceptable for Xpert Xpress SARS-CoV-2/FLU/RSV testing.  Fact Sheet for Patients: bloggercourse.com  Fact Sheet for Healthcare Providers: seriousbroker.it  This test is not yet approved or cleared by the [place]  FDA and has been authorized for detection and/or diagnosis of SARS-CoV-2 by FDA under an Emergency Use Authorization (EUA). This EUA will remain in effect (meaning this test can be used) for the duration of the COVID-19 declaration under Section 564(b)(1) of the Act, 21 U.S.C. section 360bbb-3(b)(1), unless the authorization is terminated or revoked.     Resp Syncytial Virus by PCR NEGATIVE NEGATIVE Final    Comment: (NOTE) Fact Sheet for Patients: bloggercourse.com  Fact Sheet for Healthcare Providers: seriousbroker.it  This test is not yet approved or cleared by the [place]  FDA and has been authorized for detection and/or diagnosis of SARS-CoV-2 by FDA under an Emergency Use Authorization (EUA). This EUA will remain in effect (meaning this test can be used) for the duration of the COVID-19 declaration under Section 564(b)(1) of the Act, 21 U.S.C. section 360bbb-3(b)(1), unless the authorization is terminated or revoked.  Performed at [facility], [address] [place], [place] [address]   MRSA Next Gen by PCR, Nasal     Status: None   Collection Time: 08/27/23  6:00 AM   Specimen: Nasal Mucosa; Nasal Swab  Result Value Ref Range Status   MRSA by PCR Next Gen NOT DETECTED NOT DETECTED Final    Comment: (NOTE) The GeneXpert MRSA Assay (FDA approved for NASAL specimens only), is  one component of a comprehensive MRSA colonization surveillance program. It is not intended to diagnose MRSA infection nor to guide or monitor treatment for MRSA infections. Test performance is not FDA approved in patients less than 42 years old. Performed at [facility], [address], [place], [place] [address]       Radiology Studies last 3 days: CT CHEST ABDOMEN PELVIS W CONTRAST Result Date: 08/28/2023 CLINICAL DATA:  Weakness. Weight loss. Occult malignancy. * Tracking Code: BO * EXAM: CT CHEST, ABDOMEN, AND PELVIS WITH CONTRAST TECHNIQUE: Multidetector CT imaging of the chest, abdomen and pelvis was performed following the standard protocol during bolus administration of intravenous contrast. RADIATION DOSE REDUCTION: This exam was performed according to the departmental dose-optimization program which includes automated exposure control, adjustment of the mA and/or kV according to patient size and/or use of iterative reconstruction technique. CONTRAST:  OMNIPAQUE  IOHEXOL  300 MG/ML  SOLN COMPARISON:  None Available. FINDINGS: CT CHEST FINDINGS Cardiovascular: The heart size is normal. No substantial pericardial effusion. Mild atherosclerotic calcification is noted in the wall of the thoracic aorta. Aberrant origin right subclavian artery noted. Mediastinum/Nodes: No mediastinal lymphadenopathy. There is no hilar lymphadenopathy. Large hiatal hernia. The esophagus has normal imaging features. There is no axillary lymphadenopathy. Lungs/Pleura: No suspicious pulmonary nodule or mass. Left base atelectasis with small left pleural effusion. Musculoskeletal: No worrisome lytic or sclerotic osseous abnormality. Chronic superior endplate compression deformity noted at T12. CT ABDOMEN PELVIS FINDINGS Hepatobiliary: No suspicious focal abnormality within the liver parenchyma. History of cholecystectomy. No intrahepatic or extrahepatic biliary dilation. Common bile duct measures 4 mm  diameter in the pancreatic head. Pancreas: Diffuse parenchymal atrophy without main duct dilatation. Spleen:  2.7 cm homogeneous water density lesion in the posterior spleen has benign appearance, suggesting cyst or pseudocyst. Adrenals/Urinary Tract: 2 cm right adrenal nodule has a relative washout of 58%, most suggestive of benign adenoma. No left adrenal nodule or mass. Kidneys unremarkable. No evidence for hydroureter. Bladder is distended. Stomach/Bowel: Large hiatal hernia. Distal stomach and duodenum are nondilated No small bowel wall thickening. No small bowel dilatation. The terminal ileum is not discretely visualized. The appendix is not well visualized, but there is no edema or inflammation in the region of the cecal tip to suggest appendicitis. Colon is largely decompressed although there is some mild gaseous distension in the hepatic flexure. Prominent rectal stool volume. Vascular/Lymphatic: There is mild atherosclerotic calcification of the abdominal aorta without aneurysm. There is no gastrohepatic or hepatoduodenal ligament lymphadenopathy. No retroperitoneal or mesenteric lymphadenopathy. No pelvic sidewall lymphadenopathy. Reproductive: Uterus not well visualized. No evidence for right adnexal mass. 5.4 x 3.8 cm unilocular cystic lesion identified posterior left pelvis. This appears to generate mass-effect on the rectum (see coronal 57/5) suggesting it represents a loculated fluid collection or cyst rather than free fluid in the pelvis. Other: There is diffuse mesenteric and body wall edema. Musculoskeletal: Small gas bubble is identified in the subcutaneous fat of the low anterior midline abdominal wall, presumably an injection site. Markedly heterogeneous mineralization of the sacrum. This is probably related to bilateral insufficiency fractures. Imaging appearance is not entirely characteristic for Paget's disease. Given lack of sclerotic lesions elsewhere, metastatic involvement is considered  less likely but not entirely excluded. IMPRESSION: 1. No definite evidence for malignancy in the chest, abdomen, or pelvis. 2. Markedly heterogeneous mineralization of the sacrum. This is probably related to bilateral insufficiency fractures. Imaging appearance is not entirely characteristic for Paget's disease. Given lack of sclerotic lesions elsewhere, metastatic involvement is considered less likely but not entirely excluded. 3. 5.4 x 3.8 cm unilocular cystic lesion posterior left pelvis. This appears to generate mass-effect on the rectum suggesting it represents a loculated fluid collection or cyst rather than free fluid in the pelvis. While not overtly concerning by CT imaging, pelvic ultrasound recommended to further evaluate. 4. Large hiatal hernia. 5. Small left pleural effusion with left base atelectasis. 6. 2 cm right adrenal nodule has a relative washout of 58%, most suggestive of benign adenoma. 7. Diffuse mesenteric and body wall edema. 8.  Aortic Atherosclerosis (ICD10-I70.0). Electronically Signed   By: Camellia Candle M.D.   On: 08/28/2023 16:45   EEG adult Result Date: 08/27/2023 Shelton Arlin KIDD, MD     08/27/2023  2:36 PM Patient Name: Tracey Wyatt MRN: [id number] Epilepsy Attending: Arlin KIDD Shelton Referring Physician/Provider: Harlow Ozell BRAVO, MD Date: 08/27/2023 Duration: 25.33 mins Patient history: 68yo female with ams. EEG to evaluate for seizure Level of alertness: Awake AEDs during EEG study: VPA Technical aspects: This EEG study was done with scalp electrodes positioned according to the 10-20 International system of electrode placement. Electrical activity was reviewed with band pass filter of 1-70Hz , sensitivity of 7 uV/mm, display speed of 51mm/sec with a 60Hz  notched filter applied as appropriate. EEG data were recorded continuously and digitally stored.  Video monitoring was available and reviewed as appropriate. Description: No clear posterior dominant rhythm was seen.  EEG showed  continuous generalized 5 to 7 Hz theta slowing.  Of note patient was noted to have near continuous movements of mouth and head consistent with her history of tardive dyskinesia's.  Therefore EEG was difficult due to movement artifact.  Hyperventilation and photic stimulation were not performed.   ABNORMALITY - Continuous slow, generalized IMPRESSION: This technically difficult study is suggestive of mild diffuse encephalopathy. No seizures or epileptiform discharges were seen throughout the recording. Arlin MALVA Krebs   DG Abd 1 View Result Date: 08/27/2023 CLINICAL DATA:  Abdominal pain EXAM: ABDOMEN - 1 VIEW COMPARISON:  None Available. FINDINGS: The bowel gas pattern is normal. No radio-opaque calculi or other significant radiographic abnormality are seen. IMPRESSION: Negative. Electronically Signed   By: Franky Stanford M.D.   On: 08/27/2023 04:08       Time spent: 50 min     Laneta Blunt, DO [facility] 08/29/2023, 1:06 PM    Dictation software may have been used to generate the above note. Typos may occur and escape review in typed/dictated notes. Please contact Dr Blunt directly for clarity if needed.  Staff may message me via secure chat in Epic  but this may not receive an immediate response,  please page me for urgent matters!  If 7PM-7AM, please contact night coverage [email or web address]

## 2023-08-29 NOTE — Plan of Care (Signed)
  Problem: Education: Goal: Knowledge of General Education information will improve Description: Including pain rating scale, medication(s)/side effects and non-pharmacologic comfort measures Outcome: Not Progressing   Problem: Health Behavior/Discharge Planning: Goal: Ability to manage health-related needs will improve Outcome: Not Progressing   Problem: Clinical Measurements: Goal: Ability to maintain clinical measurements within normal limits will improve Outcome: Not Progressing Goal: Will remain free from infection Outcome: Progressing Goal: Diagnostic test results will improve Outcome: Progressing

## 2023-08-30 ENCOUNTER — Other Ambulatory Visit: Payer: Self-pay

## 2023-08-30 DIAGNOSIS — E46 Unspecified protein-calorie malnutrition: Secondary | ICD-10-CM

## 2023-08-30 DIAGNOSIS — Z515 Encounter for palliative care: Secondary | ICD-10-CM | POA: Diagnosis not present

## 2023-08-30 DIAGNOSIS — B9561 Methicillin susceptible Staphylococcus aureus infection as the cause of diseases classified elsewhere: Secondary | ICD-10-CM | POA: Diagnosis not present

## 2023-08-30 DIAGNOSIS — R4182 Altered mental status, unspecified: Secondary | ICD-10-CM | POA: Diagnosis not present

## 2023-08-30 DIAGNOSIS — G9340 Encephalopathy, unspecified: Secondary | ICD-10-CM

## 2023-08-30 DIAGNOSIS — Z7189 Other specified counseling: Secondary | ICD-10-CM | POA: Diagnosis not present

## 2023-08-30 DIAGNOSIS — F209 Schizophrenia, unspecified: Secondary | ICD-10-CM

## 2023-08-30 DIAGNOSIS — R7881 Bacteremia: Secondary | ICD-10-CM | POA: Diagnosis not present

## 2023-08-30 DIAGNOSIS — R634 Abnormal weight loss: Secondary | ICD-10-CM | POA: Diagnosis not present

## 2023-08-30 DIAGNOSIS — R410 Disorientation, unspecified: Secondary | ICD-10-CM | POA: Diagnosis not present

## 2023-08-30 DIAGNOSIS — R627 Adult failure to thrive: Secondary | ICD-10-CM | POA: Diagnosis not present

## 2023-08-30 LAB — COMPREHENSIVE METABOLIC PANEL
ALT: 14 U/L (ref 0–44)
AST: 20 U/L (ref 15–41)
Albumin: 2 g/dL — ABNORMAL LOW (ref 3.5–5.0)
Alkaline Phosphatase: 67 U/L (ref 38–126)
Anion gap: 12 (ref 5–15)
BUN: 9 mg/dL (ref 8–23)
CO2: 22 mmol/L (ref 22–32)
Calcium: 6.9 mg/dL — ABNORMAL LOW (ref 8.9–10.3)
Chloride: 97 mmol/L — ABNORMAL LOW (ref 98–111)
Creatinine, Ser: <0.3 mg/dL — ABNORMAL LOW (ref 0.44–1.00)
Glucose, Bld: 99 mg/dL (ref 70–99)
Potassium: 2.5 mmol/L — CL (ref 3.5–5.1)
Sodium: 131 mmol/L — ABNORMAL LOW (ref 135–145)
Total Bilirubin: 0.5 mg/dL (ref 0.0–1.2)
Total Protein: 4.7 g/dL — ABNORMAL LOW (ref 6.5–8.1)

## 2023-08-30 LAB — BLOOD CULTURE ID PANEL (REFLEXED) - BCID2

## 2023-08-30 LAB — URINALYSIS, COMPLETE (UACMP) WITH MICROSCOPIC
Bilirubin Urine: NEGATIVE
Glucose, UA: NEGATIVE mg/dL
Hgb urine dipstick: NEGATIVE
Ketones, ur: NEGATIVE mg/dL
Leukocytes,Ua: NEGATIVE
Nitrite: NEGATIVE
Protein, ur: 100 mg/dL — AB
Specific Gravity, Urine: 1.021 (ref 1.005–1.030)
Squamous Epithelial / HPF: 0 /[HPF] (ref 0–5)
pH: 7 (ref 5.0–8.0)

## 2023-08-30 LAB — CBC
HCT: 28.7 % — ABNORMAL LOW (ref 36.0–46.0)
Hemoglobin: 10 g/dL — ABNORMAL LOW (ref 12.0–15.0)
MCH: 30 pg (ref 26.0–34.0)
MCHC: 34.8 g/dL (ref 30.0–36.0)
MCV: 86.2 fL (ref 80.0–100.0)
Platelets: 247 10*3/uL (ref 150–400)
RBC: 3.33 MIL/uL — ABNORMAL LOW (ref 3.87–5.11)
RDW: 11.4 % — ABNORMAL LOW (ref 11.5–15.5)
WBC: 27.3 10*3/uL — ABNORMAL HIGH (ref 4.0–10.5)
nRBC: 0 % (ref 0.0–0.2)

## 2023-08-30 LAB — MAGNESIUM: Magnesium: 1.5 mg/dL — ABNORMAL LOW (ref 1.7–2.4)

## 2023-08-30 MED ORDER — CEFAZOLIN SODIUM-DEXTROSE 2-4 GM/100ML-% IV SOLN
2.0000 g | Freq: Three times a day (TID) | INTRAVENOUS | Status: DC
  Administered 2023-08-30 – 2023-08-31 (×4): 2 g via INTRAVENOUS
  Filled 2023-08-30 (×3): qty 100

## 2023-08-30 MED ORDER — POTASSIUM CHLORIDE 10 MEQ/100ML IV SOLN
10.0000 meq | INTRAVENOUS | Status: AC
Start: 2023-08-30 — End: 2023-08-30
  Administered 2023-08-30 (×4): 10 meq via INTRAVENOUS
  Filled 2023-08-30 (×2): qty 100

## 2023-08-30 MED ORDER — CEFAZOLIN SODIUM-DEXTROSE 1-4 GM/50ML-% IV SOLN
1.0000 g | Freq: Three times a day (TID) | INTRAVENOUS | Status: DC
  Administered 2023-08-30: 1 g via INTRAVENOUS
  Filled 2023-08-30 (×2): qty 50

## 2023-08-30 MED ORDER — MAGNESIUM SULFATE 2 GM/50ML IV SOLN
2.0000 g | Freq: Once | INTRAVENOUS | Status: AC
  Administered 2023-08-30: 2 g via INTRAVENOUS
  Filled 2023-08-30: qty 50

## 2023-08-30 MED ORDER — SODIUM CHLORIDE 0.9 % IV BOLUS
500.0000 mL | Freq: Once | INTRAVENOUS | Status: AC
  Administered 2023-08-30: 500 mL via INTRAVENOUS

## 2023-08-30 NOTE — Progress Notes (Signed)
 PROGRESS NOTE    Tracey Wyatt   FMW:[id number] DOB: 1955/12/31  DOA: 08/25/2023 Date of Service: 08/30/23 which is hospital day 4  PCP: Alois Charm, MD (Inactive)    Hospital course / significant events:   HPI: [name], a 68 y/o with h/o HTN, tardive dyskinesia, vague history of seizure disorder, psychiatric disorders - depression, schizophrenia, pyromania. She took haldol  for many years and developed tardive diskinesia but has been of haldol  and risperidone  for >10 years. She has resided in a long term care facility for 20 years. . Over the past year she has slowly declined: losing weight, hair turned gray, she had behavior problems. For  two months she has had a progressive decline and over the past several weeks has become withdrawn and less communicative, decreased PO intake, worsening weight loss, and progressive decline in ability to manage ADLs and ambulate. Her brother is the primary historian and has been involved with her care for years.   Detailed neurology consult note 02/07 for other hisory:   While she was treated with Haldol  and risperidone  for many years they reports she has been off of any of those medications for at least 1 or 2 decades.  Unfortunately she has had progressive decline in her cognitive abilities as well as weight loss for the past year despite eating well   Due to her weight loss they were encouraging her to eat more and noted that she was still eating well 3 weeks ago although she was more slouched over fatigued appearing.  Now for the past 3 weeks she has hardly been able to eat and not walking well   Additionally they note that 6 - 8 weeks ago she was noted to be more aggressive with the staff hitting etc. and thinks some medication changes may have been made at the time, but they are unclear on details  She was seen in [facility] 08/24/23 for these problems. Her workup including labs, CT brain, U/A were unrevealing. She was returned to her  long term care facility. She now returns to [facility] with worsening symptoms, essentially non-communicative.    02/04: to ED 02/05: admitted to hospitalist service, unable to get EEG until tomorrow. Nonverbal, somnolent.  02/06: More awake today but disoriented, slow/difficult speech. SLP, PT, OT to see. EEG technically difficult study suggestive of mild diffuse encephalopathy. Neuro consult ordered. Qualifies for SNF rehab but concern for po intake 02/07: awake and interactive today, not eating much. Neuro recs as below - w/u movement d/o for reversible causes, reviewing meds for possible reversible causes. See A/P for full discussion - if feeding tube would provide nutrition while treatment takes time to help her chorea, then this would be reasonable but if the condition is expected to stay stable or deteriorate family would not want feeding tube and will consider comfort measures 02/08: more somnolent today and not eating. Palliative to see - see consult note. WBC up, infectious w/u and abx.  02/09: more alert this morning, says she's hungry, making joked. Talked to family re: nutrition - brothers considering for NG tube which I think is reasonable. Continue abx    Consultants:  Neurology   Procedures/Surgeries: none      ASSESSMENT & PLAN:   Change in mental status /  Metabolic Encephalopathy  Progressive movement disorder working dx is tardive dyskinesia  Questionable seizure history  Ddx would also include advancement of underlying dementia, psychiatric disorder Very much appreciate neurology evaluation  Investigate reversible causes:  PENDING: B1,  B3, B6, MMA, UDS add on if possible (ordered) PENDING: Other treatable causes of chorea screening: ANA w/ reflex, SPEP and IFE with 4 times daily, 24-hour urine UPEP/U IFE/light chains/total protein, serum copper , 24-hour urine copper   NORMAL/NR: TSH, RPR, HIV, ammonia, CK, ESR, B12, VItD, folate ABNORMAL:  CRP 13  Investigate for  seizure EEG was no concerns Imaging  Currently unable to obtain MRI due to restlessness, likely will be unable to obtain anesthetized MRI at [facility] but would consider obtaining this if she undergoes anesthesia for any other reason / if we are able to get movement under control  CT chest/abdomen/pelvis to evaluate for occult malignancy which might also explain weight loss --> no apparent malignancy Consider thyroid ultrasound previously recommended in June if thyroid nodule is still present  Medication additions / changes - managed by neurology, note pt swallowing pills has been a challenge  Olanzapine  2.5 mg IM twice daily Continue benztropine  1 mg 3 times daily - this medication can at times worsen movement disorders and is associated with cognitive impairment so was reduced from home dose  Continue Depacon  120mg  every 8 hours 100 mg IV thiamine  daily pending level, may be discontinued if level results normal  Consider restarting risperidone  to see if this improves movements Stopped keppra in case this is contributing to mood issues/agitation describedenough for her to tolerate p.o. intake depending on her clinical course  Anticipate outpatient follow up neurology / neuropsych / movement disorder clinic    SIRS/Sepsis Bacteremia - (+)BCx Staph Aureus  Suspect UTI - low UOP, bladder scan never done, distended bladder on CT, (+)UOP w/ foley  CXR questionable LLL infiltrate vs atelectasis  follow UA, UCx, BCx,  continue broad abx today is day 2 Await BCx ID/Susc  ID consult   Protein calorie malnutrition  Weight loss Long term calorie deprivation.  concerned that her weight loss may be primarily secondary to the severity of her movements (caloric needs, inability to functionally coordinate eating) causing inability to swallow, other possibility is dementia or other progressive cause Poor reserves confer very low chance of recovery at this point  Have reasonably ruled out malignancy   She is alert and communicative today, following commands, not oriented, says she feels hungry. Discussed w/ family today 08/30/23 - if movement d/o has caused po intake to be functionally impossible, she may benefit from artificial feeding though expect this may have to be long term - can consider NG placement I offered option for NG trial, brothers will discuss but are leaning toward NG tube trial.  I advised if we cannot place NG or if placement is successful but it is causing her significant distress / she pulls it out, this would indicate less favorable for G/J tube if she is going to pull at tubes.  I advised that a time-limited trial of artificial feeding may be reasonable, if it's tolerated, but if she is not improving (remaining significantly encephalopathic, bed-bound, etc) then family is in agreement that would not continue artifical feeds indefinitely.   Cystic pelvic mass 3.8 x 5.4 cm, favored benign Possible compressive effect on rectum Stool softeners as needed Consider pelvic US  Possible abscess? Will ask radiology to comment on this but it's not noted in their report   HTN (hypertension) BP well controlled. At this point she is not able to take po medications Hydralazine  2 mg q6 for SBP > 160   Hiatal Hernia, large PPI  Advanced care planning Pt disoriented, does not have capacity to  make decisions  Palliative care to see to discuss plan for best meeting goals  SEE ABOVE RE: FEEDING TUBE DISCUSSION   underweight based on BMI: Body mass index is 15.33 kg/m.  Underweight - under 18  overweight - 25 to 29 obese - 30 or more Class 1 obesity: BMI of 30.0 to 34 Class 2 obesity: BMI of 35.0 to 39 Class 3 obesity: BMI of 40.0 to 49 Super Morbid Obesity: BMI 50-59 Super-super Morbid Obesity: BMI 60+ Significantly low or high BMI is associated with higher medical risk.  Weight management advised as adjunct to other disease management and risk reduction treatments    DVT  prophylaxis: lovenox   IV fluids: D5 continuous IV fluids  Nutrition: NPO pend SLP Central lines / invasive devices: none  Code Status: DNR confirmed w/ brother  ACP documentation reviewed:  none on file in [facility]  TOC needs: TBD, expect may need LTC possibly rehab Barriers to dispo / significant pending items: neuro workup, infectious workup, feeding                Subjective: Pt unable to contribute much - states no problems, denies pain, states I'm trying to find a man and laughs.  Brother at bedside has no additional concerns    Family Communication: brother and his wife are at bedside on rounds - se discussion above re advanced care planning     Objective Findings:  Vitals:   08/29/23 2011 08/30/23 0455 08/30/23 0603 08/30/23 0824  BP: 128/81 131/64 113/79 119/80  Pulse:  (!) 43 (!) 52 60  Resp: 16 20 20 16   Temp: (!) 97.3 F (36.3 C) 98.9 F (37.2 C) 97.9 F (36.6 C) 99.7 F (37.6 C)  TempSrc:      SpO2: 91% 95% 94% 94%  Weight:      Height:        Intake/Output Summary (Last 24 hours) at 08/30/2023 0912 Last data filed at 08/29/2023 1723 Gross per 24 hour  Intake 3.71 ml  Output --  Net 3.71 ml    Filed Weights   08/26/23 0030 08/26/23 2147  Weight: 43.1 kg 36.2 kg    Examination:  Physical Exam Constitutional:      General: She is not in acute distress.    Appearance: She is ill-appearing. She is not toxic-appearing.     Comments: frail  Cardiovascular:     Rate and Rhythm: Normal rate and regular rhythm.  Pulmonary:     Effort: Pulmonary effort is normal.     Breath sounds: Normal breath sounds.  Abdominal:     General: Abdomen is flat.     Palpations: Abdomen is soft.  Musculoskeletal:     Right lower leg: No edema.     Left lower leg: No edema.  Skin:    General: Skin is dry.  Neurological:     Mental Status: She is alert. She is disoriented.     Motor: Weakness present.  Psychiatric:        Behavior: Behavior normal.           Scheduled Medications:   aspirin   325 mg Oral Daily   benztropine   1 mg Oral TID   enoxaparin  (LOVENOX ) injection  30 mg Subcutaneous Q24H   ferrous sulfate   15 mg of iron Oral Daily   OLANZapine   2.5 mg Intramuscular BID   pantoprazole  (PROTONIX ) IV  40 mg Intravenous QHS   polyethylene glycol  17 g Oral Daily   thiamine  (VITAMIN B1)  injection  100 mg Intravenous Daily    Continuous Infusions:   ceFAZolin  (ANCEF ) IV     valproate sodium  120 mg (08/30/23 0551)    PRN Medications:  hydrALAZINE , LORazepam   Antimicrobials from admission:  Anti-infectives (From admission, onward)    Start     Dose/Rate Route Frequency Ordered Stop   08/30/23 1900  vancomycin  (VANCOREADY) IVPB 500 mg/100 mL  Status:  Discontinued        500 mg 100 mL/hr over 60 Minutes Intravenous Every 24 hours 08/29/23 1839 08/30/23 0437   08/30/23 1400  ceFAZolin  (ANCEF ) IVPB 2g/100 mL premix        2 g 200 mL/hr over 30 Minutes Intravenous Every 8 hours 08/30/23 0825     08/30/23 0600  ceFAZolin  (ANCEF ) IVPB 1 g/50 mL premix  Status:  Discontinued        1 g 100 mL/hr over 30 Minutes Intravenous Every 8 hours 08/30/23 0436 08/30/23 0825   08/29/23 1830  cefTRIAXone  (ROCEPHIN ) 1 g in sodium chloride  0.9 % 100 mL IVPB  Status:  Discontinued        1 g 200 mL/hr over 30 Minutes Intravenous Every 24 hours 08/29/23 1727 08/30/23 0436   08/29/23 1830  vancomycin  (VANCOCIN ) 750 mg in sodium chloride  0.9 % 250 mL IVPB        750 mg 250 mL/hr over 60 Minutes Intravenous Once 08/29/23 1734 08/29/23 2221           Data Reviewed:  I have personally reviewed the following...  CBC: Recent Labs  Lab 08/24/23 1708 08/25/23 2046 08/29/23 1143  WBC 8.7 5.5 23.0*  NEUTROABS 6.0 3.6 21.2*  HGB 9.5* 10.6* 10.3*  HCT 27.9* 31.5* 29.6*  MCV 88.9 90.0 87.3  PLT 189 204 273   Basic Metabolic Panel: Recent Labs  Lab 08/24/23 1708 08/25/23 1924 08/29/23 1749  NA 137 141  --   K 3.8 4.0  --    CL 104 106  --   CO2 22 26  --   GLUCOSE 89 95  --   BUN 41* 39*  --   CREATININE 0.63 0.50 0.33*  CALCIUM 8.6* 8.3*  --    GFR: Estimated Creatinine Clearance: 38.5 mL/min (A) (by C-G formula based on SCr of 0.33 mg/dL (L)). Liver Function Tests: Recent Labs  Lab 08/25/23 1924  AST 16  ALT 14  ALKPHOS 36*  BILITOT 0.8  PROT 5.4*  ALBUMIN 2.7*   No results for input(s): LIPASE, AMYLASE in the last 168 hours. Recent Labs  Lab 08/27/23 1420  AMMONIA 25   Coagulation Profile: No results for input(s): INR, PROTIME in the last 168 hours. Cardiac Enzymes: Recent Labs  Lab 08/28/23 1022  CKTOTAL 169   BNP (last 3 results) No results for input(s): PROBNP in the last 8760 hours. HbA1C: No results for input(s): HGBA1C in the last 72 hours. CBG: Recent Labs  Lab 08/28/23 2009 08/29/23 0803 08/29/23 1113 08/29/23 1557 08/29/23 2012  GLUCAP 109* 109* 105* 91 93   Lipid Profile: No results for input(s): CHOL, HDL, LDLCALC, TRIG, CHOLHDL, LDLDIRECT in the last 72 hours. Thyroid Function Tests: Recent Labs    08/27/23 1420  TSH 0.461   Anemia Panel: Recent Labs    08/27/23 1420  VITAMINB12 827  FOLATE 13.3   Most Recent Urinalysis On File:     Component Value Date/Time   COLORURINE AMBER (A) 08/25/2023 2046   APPEARANCEUR CLEAR (A) 08/25/2023 2046   LABSPEC 1.029 08/25/2023  2046   PHURINE 5.0 08/25/2023 2046   GLUCOSEU NEGATIVE 08/25/2023 2046   HGBUR NEGATIVE 08/25/2023 2046   BILIRUBINUR NEGATIVE 08/25/2023 2046   KETONESUR 5 (A) 08/25/2023 2046   PROTEINUR 30 (A) 08/25/2023 2046   NITRITE NEGATIVE 08/25/2023 2046   LEUKOCYTESUR NEGATIVE 08/25/2023 2046   Sepsis Labs: @LABRCNTIP (procalcitonin:4,lacticidven:4) Microbiology: Recent Results (from the past 240 hours)  Resp panel by RT-PCR (RSV, Flu A&B, Covid) Urine, Catheterized     Status: None   Collection Time: 08/24/23 10:36 PM   Specimen: Urine, Catheterized; Nasal Swab   Result Value Ref Range Status   SARS Coronavirus 2 by RT PCR NEGATIVE NEGATIVE Final    Comment: (NOTE) SARS-CoV-2 target nucleic acids are NOT DETECTED.  The SARS-CoV-2 RNA is generally detectable in upper respiratory specimens during the acute phase of infection. The lowest concentration of SARS-CoV-2 viral copies this assay can detect is 138 copies/mL. A negative result does not preclude SARS-Cov-2 infection and should not be used as the sole basis for treatment or other patient management decisions. A negative result may occur with  improper specimen collection/handling, submission of specimen other than nasopharyngeal swab, presence of viral mutation(s) within the areas targeted by this assay, and inadequate number of viral copies(<138 copies/mL). A negative result must be combined with clinical observations, patient history, and epidemiological information. The expected result is Negative.  Fact Sheet for Patients:  bloggercourse.com  Fact Sheet for Healthcare Providers:  seriousbroker.it  This test is no t yet approved or cleared by the [place]  FDA and  has been authorized for detection and/or diagnosis of SARS-CoV-2 by FDA under an Emergency Use Authorization (EUA). This EUA will remain  in effect (meaning this test can be used) for the duration of the COVID-19 declaration under Section 564(b)(1) of the Act, 21 U.S.C.section 360bbb-3(b)(1), unless the authorization is terminated  or revoked sooner.       Influenza A by PCR NEGATIVE NEGATIVE Final   Influenza B by PCR NEGATIVE NEGATIVE Final    Comment: (NOTE) The Xpert Xpress SARS-CoV-2/FLU/RSV plus assay is intended as an aid in the diagnosis of influenza from Nasopharyngeal swab specimens and should not be used as a sole basis for treatment. Nasal washings and aspirates are unacceptable for Xpert Xpress SARS-CoV-2/FLU/RSV testing.  Fact Sheet for  Patients: bloggercourse.com  Fact Sheet for Healthcare Providers: seriousbroker.it  This test is not yet approved or cleared by the [place]  FDA and has been authorized for detection and/or diagnosis of SARS-CoV-2 by FDA under an Emergency Use Authorization (EUA). This EUA will remain in effect (meaning this test can be used) for the duration of the COVID-19 declaration under Section 564(b)(1) of the Act, 21 U.S.C. section 360bbb-3(b)(1), unless the authorization is terminated or revoked.     Resp Syncytial Virus by PCR NEGATIVE NEGATIVE Final    Comment: (NOTE) Fact Sheet for Patients: bloggercourse.com  Fact Sheet for Healthcare Providers: seriousbroker.it  This test is not yet approved or cleared by the [place]  FDA and has been authorized for detection and/or diagnosis of SARS-CoV-2 by FDA under an Emergency Use Authorization (EUA). This EUA will remain in effect (meaning this test can be used) for the duration of the COVID-19 declaration under Section 564(b)(1) of the Act, 21 U.S.C. section 360bbb-3(b)(1), unless the authorization is terminated or revoked.  Performed at [facility], [address] [place], [place] [address]   MRSA Next Gen by PCR, Nasal     Status: None   Collection Time:  08/27/23  6:00 AM   Specimen: Nasal Mucosa; Nasal Swab  Result Value Ref Range Status   MRSA by PCR Next Gen NOT DETECTED NOT DETECTED Final    Comment: (NOTE) The GeneXpert MRSA Assay (FDA approved for NASAL specimens only), is one component of a comprehensive MRSA colonization surveillance program. It is not intended to diagnose MRSA infection nor to guide or monitor treatment for MRSA infections. Test performance is not FDA approved in patients less than 27 years old. Performed at [facility], [address] [place], [place] [address]   Culture,  blood (Routine X 2) w Reflex to ID Panel     Status: None (Preliminary result)   Collection Time: 08/29/23  3:09 PM   Specimen: BLOOD  Result Value Ref Range Status   Specimen Description BLOOD RAC  Final   Special Requests   Final    BOTTLES DRAWN AEROBIC AND ANAEROBIC Blood Culture adequate volume   Culture  Setup Time   Final    GRAM POSITIVE COCCI IN BOTH AEROBIC AND ANAEROBIC BOTTLES Organism ID to follow CRITICAL RESULT CALLED TO, READ BACK BY AND VERIFIED WITH: NATHAN BELUE @ 0124 08/30/23 [facility] Performed at [facility], [address] [place], [place] [address]    Culture GRAM POSITIVE COCCI  Final   Report Status PENDING  Incomplete  Culture, blood (Routine X 2) w Reflex to ID Panel     Status: None (Preliminary result)   Collection Time: 08/29/23  3:09 PM   Specimen: BLOOD  Result Value Ref Range Status   Specimen Description BLOOD LAC  Final   Special Requests   Final    BOTTLES DRAWN AEROBIC AND ANAEROBIC Blood Culture adequate volume   Culture  Setup Time   Final    GRAM POSITIVE COCCI IN BOTH AEROBIC AND ANAEROBIC BOTTLES CRITICAL RESULT CALLED TO, READ BACK BY AND VERIFIED WITHBETHA RANKIN DILLS @ [phone] 08/30/23 [facility] Performed at [facility], [address] [place], [place] [address]    Culture GRAM POSITIVE COCCI  Final   Report Status PENDING  Incomplete  Blood Culture ID Panel (Reflexed)     Status: Abnormal   Collection Time: 08/29/23  3:09 PM  Result Value Ref Range Status   Enterococcus faecalis NOT DETECTED NOT DETECTED Final   Enterococcus Faecium NOT DETECTED NOT DETECTED Final   Listeria monocytogenes NOT DETECTED NOT DETECTED Final   Staphylococcus species DETECTED (A) NOT DETECTED Final    Comment: CRITICAL RESULT CALLED TO, READ BACK BY AND VERIFIED WITH: NATHAN BELUE @ 0124 08/30/23 [facility]    Staphylococcus aureus (BCID) DETECTED (A) NOT DETECTED Final    Comment: CRITICAL RESULT CALLED TO, READ BACK BY AND VERIFIED WITH: NATHAN BELUE @  0124 08/30/23 [facility]    Staphylococcus epidermidis NOT DETECTED NOT DETECTED Final   Staphylococcus lugdunensis NOT DETECTED NOT DETECTED Final   Streptococcus species NOT DETECTED NOT DETECTED Final   Streptococcus agalactiae NOT DETECTED NOT DETECTED Final   Streptococcus pneumoniae NOT DETECTED NOT DETECTED Final   Streptococcus pyogenes NOT DETECTED NOT DETECTED Final   A.calcoaceticus-baumannii NOT DETECTED NOT DETECTED Final   Bacteroides fragilis NOT DETECTED NOT DETECTED Final   Enterobacterales NOT DETECTED NOT DETECTED Final   Enterobacter cloacae complex NOT DETECTED NOT DETECTED Final   Escherichia coli NOT DETECTED NOT DETECTED Final   Klebsiella aerogenes NOT DETECTED NOT DETECTED Final   Klebsiella oxytoca NOT DETECTED NOT DETECTED Final   Klebsiella pneumoniae NOT DETECTED NOT DETECTED Final  Proteus species NOT DETECTED NOT DETECTED Final   Salmonella species NOT DETECTED NOT DETECTED Final   Serratia marcescens NOT DETECTED NOT DETECTED Final   Haemophilus influenzae NOT DETECTED NOT DETECTED Final   Neisseria meningitidis NOT DETECTED NOT DETECTED Final   Pseudomonas aeruginosa NOT DETECTED NOT DETECTED Final   Stenotrophomonas maltophilia NOT DETECTED NOT DETECTED Final   Candida albicans NOT DETECTED NOT DETECTED Final   Candida auris NOT DETECTED NOT DETECTED Final   Candida glabrata NOT DETECTED NOT DETECTED Final   Candida krusei NOT DETECTED NOT DETECTED Final   Candida parapsilosis NOT DETECTED NOT DETECTED Final   Candida tropicalis NOT DETECTED NOT DETECTED Final   Cryptococcus neoformans/gattii NOT DETECTED NOT DETECTED Final   Meth resistant mecA/C and MREJ NOT DETECTED NOT DETECTED Final    Comment: Performed at [facility], [address], [place], [place] [address]      Radiology Studies last 3 days: [facility] Chest Port 1 View Result Date: 08/29/2023 CLINICAL DATA:  Sirs EXAM: PORTABLE CHEST 1 VIEW COMPARISON:  08/24/2023 FINDINGS: Heart and  mediastinal contours within normal limits. Left lower lobe retrocardiac opacity. Right lung clear. No visual effusions or acute bony abnormality. IMPRESSION: Left lower lobe retrocardiac atelectasis or infiltrate. Electronically Signed   By: Franky Crease M.D.   On: 08/29/2023 17:09   CT CHEST ABDOMEN PELVIS W CONTRAST Result Date: 08/28/2023 CLINICAL DATA:  Weakness. Weight loss. Occult malignancy. * Tracking Code: BO * EXAM: CT CHEST, ABDOMEN, AND PELVIS WITH CONTRAST TECHNIQUE: Multidetector CT imaging of the chest, abdomen and pelvis was performed following the standard protocol during bolus administration of intravenous contrast. RADIATION DOSE REDUCTION: This exam was performed according to the departmental dose-optimization program which includes automated exposure control, adjustment of the mA and/or kV according to patient size and/or use of iterative reconstruction technique. CONTRAST:  OMNIPAQUE  IOHEXOL  300 MG/ML  SOLN COMPARISON:  None Available. FINDINGS: CT CHEST FINDINGS Cardiovascular: The heart size is normal. No substantial pericardial effusion. Mild atherosclerotic calcification is noted in the wall of the thoracic aorta. Aberrant origin right subclavian artery noted. Mediastinum/Nodes: No mediastinal lymphadenopathy. There is no hilar lymphadenopathy. Large hiatal hernia. The esophagus has normal imaging features. There is no axillary lymphadenopathy. Lungs/Pleura: No suspicious pulmonary nodule or mass. Left base atelectasis with small left pleural effusion. Musculoskeletal: No worrisome lytic or sclerotic osseous abnormality. Chronic superior endplate compression deformity noted at T12. CT ABDOMEN PELVIS FINDINGS Hepatobiliary: No suspicious focal abnormality within the liver parenchyma. History of cholecystectomy. No intrahepatic or extrahepatic biliary dilation. Common bile duct measures 4 mm diameter in the pancreatic head. Pancreas: Diffuse parenchymal atrophy without main duct  dilatation. Spleen: 2.7 cm homogeneous water density lesion in the posterior spleen has benign appearance, suggesting cyst or pseudocyst. Adrenals/Urinary Tract: 2 cm right adrenal nodule has a relative washout of 58%, most suggestive of benign adenoma. No left adrenal nodule or mass. Kidneys unremarkable. No evidence for hydroureter. Bladder is distended. Stomach/Bowel: Large hiatal hernia. Distal stomach and duodenum are nondilated No small bowel wall thickening. No small bowel dilatation. The terminal ileum is not discretely visualized. The appendix is not well visualized, but there is no edema or inflammation in the region of the cecal tip to suggest appendicitis. Colon is largely decompressed although there is some mild gaseous distension in the hepatic flexure. Prominent rectal stool volume. Vascular/Lymphatic: There is mild atherosclerotic calcification of the abdominal aorta without aneurysm. There is no gastrohepatic or hepatoduodenal ligament lymphadenopathy. No retroperitoneal or mesenteric  lymphadenopathy. No pelvic sidewall lymphadenopathy. Reproductive: Uterus not well visualized. No evidence for right adnexal mass. 5.4 x 3.8 cm unilocular cystic lesion identified posterior left pelvis. This appears to generate mass-effect on the rectum (see coronal 57/5) suggesting it represents a loculated fluid collection or cyst rather than free fluid in the pelvis. Other: There is diffuse mesenteric and body wall edema. Musculoskeletal: Small gas bubble is identified in the subcutaneous fat of the low anterior midline abdominal wall, presumably an injection site. Markedly heterogeneous mineralization of the sacrum. This is probably related to bilateral insufficiency fractures. Imaging appearance is not entirely characteristic for Paget's disease. Given lack of sclerotic lesions elsewhere, metastatic involvement is considered less likely but not entirely excluded. IMPRESSION: 1. No definite evidence for malignancy  in the chest, abdomen, or pelvis. 2. Markedly heterogeneous mineralization of the sacrum. This is probably related to bilateral insufficiency fractures. Imaging appearance is not entirely characteristic for Paget's disease. Given lack of sclerotic lesions elsewhere, metastatic involvement is considered less likely but not entirely excluded. 3. 5.4 x 3.8 cm unilocular cystic lesion posterior left pelvis. This appears to generate mass-effect on the rectum suggesting it represents a loculated fluid collection or cyst rather than free fluid in the pelvis. While not overtly concerning by CT imaging, pelvic ultrasound recommended to further evaluate. 4. Large hiatal hernia. 5. Small left pleural effusion with left base atelectasis. 6. 2 cm right adrenal nodule has a relative washout of 58%, most suggestive of benign adenoma. 7. Diffuse mesenteric and body wall edema. 8.  Aortic Atherosclerosis (ICD10-I70.0). Electronically Signed   By: Camellia Candle M.D.   On: 08/28/2023 16:45   EEG adult Result Date: 08/27/2023 Shelton Arlin KIDD, MD     08/27/2023  2:36 PM Patient Name: Tracey Wyatt MRN: [id number] Epilepsy Attending: Arlin KIDD Shelton Referring Physician/Provider: Harlow Ozell BRAVO, MD Date: 08/27/2023 Duration: 25.33 mins Patient history: 68yo female with ams. EEG to evaluate for seizure Level of alertness: Awake AEDs during EEG study: VPA Technical aspects: This EEG study was done with scalp electrodes positioned according to the 10-20 International system of electrode placement. Electrical activity was reviewed with band pass filter of 1-70Hz , sensitivity of 7 uV/mm, display speed of 76mm/sec with a 60Hz  notched filter applied as appropriate. EEG data were recorded continuously and digitally stored.  Video monitoring was available and reviewed as appropriate. Description: No clear posterior dominant rhythm was seen.  EEG showed continuous generalized 5 to 7 Hz theta slowing.  Of note patient was noted to have near  continuous movements of mouth and head consistent with her history of tardive dyskinesia's.  Therefore EEG was difficult due to movement artifact.  Hyperventilation and photic stimulation were not performed.   ABNORMALITY - Continuous slow, generalized IMPRESSION: This technically difficult study is suggestive of mild diffuse encephalopathy. No seizures or epileptiform discharges were seen throughout the recording. Arlin KIDD Shelton   DG Abd 1 View Result Date: 08/27/2023 CLINICAL DATA:  Abdominal pain EXAM: ABDOMEN - 1 VIEW COMPARISON:  None Available. FINDINGS: The bowel gas pattern is normal. No radio-opaque calculi or other significant radiographic abnormality are seen. IMPRESSION: Negative. Electronically Signed   By: Franky Stanford M.D.   On: 08/27/2023 04:08       Time spent: 50 min     Laneta Blunt, DO [facility] 08/30/2023, 9:12 AM    Dictation software may have been used to generate the above note. Typos may occur and escape review in typed/dictated notes. Please contact Dr  Lenice Koper directly for clarity if needed.  Staff may message me via secure chat in Epic  but this may not receive an immediate response,  please page me for urgent matters!  If 7PM-7AM, please contact night coverage [email or web address]

## 2023-08-30 NOTE — Progress Notes (Signed)
 PHARMACY - PHYSICIAN COMMUNICATION CRITICAL VALUE ALERT - BLOOD CULTURE IDENTIFICATION (BCID)  Results for orders placed or performed during the hospital encounter of 08/25/23  MRSA Next Gen by PCR, Nasal     Status: None   Collection Time: 08/27/23  6:00 AM   Specimen: Nasal Mucosa; Nasal Swab  Result Value Ref Range Status   MRSA by PCR Next Gen NOT DETECTED NOT DETECTED Final    Comment: (NOTE) The GeneXpert MRSA Assay (FDA approved for NASAL specimens only), is one component of a comprehensive MRSA colonization surveillance program. It is not intended to diagnose MRSA infection nor to guide or monitor treatment for MRSA infections. Test performance is not FDA approved in patients less than 11 years old. Performed at [facility], [address] [place], [place] [address]   Culture, blood (Routine X 2) w Reflex to ID Panel     Status: None (Preliminary result)   Collection Time: 08/29/23  3:09 PM   Specimen: BLOOD  Result Value Ref Range Status   Specimen Description BLOOD RAC  Final   Special Requests   Final    BOTTLES DRAWN AEROBIC AND ANAEROBIC Blood Culture adequate volume   Culture  Setup Time   Final    GRAM POSITIVE COCCI IN BOTH AEROBIC AND ANAEROBIC BOTTLES Organism ID to follow CRITICAL RESULT CALLED TO, READ BACK BY AND VERIFIED WITH: Jaydis Duchene @ 0124 08/30/23 [facility] Performed at [facility], [address], [place], [place] [address]    Culture GRAM POSITIVE COCCI  Final   Report Status PENDING  Incomplete  Culture, blood (Routine X 2) w Reflex to ID Panel     Status: None (Preliminary result)   Collection Time: 08/29/23  3:09 PM   Specimen: BLOOD  Result Value Ref Range Status   Specimen Description BLOOD LAC  Final   Special Requests   Final    BOTTLES DRAWN AEROBIC AND ANAEROBIC Blood Culture adequate volume   Culture  Setup Time   Final    GRAM POSITIVE COCCI IN BOTH AEROBIC AND ANAEROBIC BOTTLES CRITICAL RESULT CALLED TO, READ BACK  BY AND VERIFIED WITHBETHA RANKIN DILLS @ 9666 08/30/23 [facility] Performed at [facility], [address] [place], [place] [address]    Culture GRAM POSITIVE COCCI  Final   Report Status PENDING  Incomplete  Blood Culture ID Panel (Reflexed)     Status: Abnormal   Collection Time: 08/29/23  3:09 PM  Result Value Ref Range Status   Enterococcus faecalis NOT DETECTED NOT DETECTED Final   Enterococcus Faecium NOT DETECTED NOT DETECTED Final   Listeria monocytogenes NOT DETECTED NOT DETECTED Final   Staphylococcus species DETECTED (A) NOT DETECTED Final    Comment: CRITICAL RESULT CALLED TO, READ BACK BY AND VERIFIED WITH: Titan Karner @ 0124 08/30/23 [facility]    Staphylococcus aureus (BCID) DETECTED (A) NOT DETECTED Final    Comment: CRITICAL RESULT CALLED TO, READ BACK BY AND VERIFIED WITH: Kamauri Denardo @ 0124 08/30/23 [facility]    Staphylococcus epidermidis NOT DETECTED NOT DETECTED Final   Staphylococcus lugdunensis NOT DETECTED NOT DETECTED Final   Streptococcus species NOT DETECTED NOT DETECTED Final   Streptococcus agalactiae NOT DETECTED NOT DETECTED Final   Streptococcus pneumoniae NOT DETECTED NOT DETECTED Final   Streptococcus pyogenes NOT DETECTED NOT DETECTED Final   A.calcoaceticus-baumannii NOT DETECTED NOT DETECTED Final   Bacteroides fragilis NOT DETECTED NOT DETECTED Final   Enterobacterales NOT DETECTED NOT DETECTED Final   Enterobacter cloacae complex  NOT DETECTED NOT DETECTED Final   Escherichia coli NOT DETECTED NOT DETECTED Final   Klebsiella aerogenes NOT DETECTED NOT DETECTED Final   Klebsiella oxytoca NOT DETECTED NOT DETECTED Final   Klebsiella pneumoniae NOT DETECTED NOT DETECTED Final   Proteus species NOT DETECTED NOT DETECTED Final   Salmonella species NOT DETECTED NOT DETECTED Final   Serratia marcescens NOT DETECTED NOT DETECTED Final   Haemophilus influenzae NOT DETECTED NOT DETECTED Final   Neisseria meningitidis NOT DETECTED NOT DETECTED Final   Pseudomonas  aeruginosa NOT DETECTED NOT DETECTED Final   Stenotrophomonas maltophilia NOT DETECTED NOT DETECTED Final   Candida albicans NOT DETECTED NOT DETECTED Final   Candida auris NOT DETECTED NOT DETECTED Final   Candida glabrata NOT DETECTED NOT DETECTED Final   Candida krusei NOT DETECTED NOT DETECTED Final   Candida parapsilosis NOT DETECTED NOT DETECTED Final   Candida tropicalis NOT DETECTED NOT DETECTED Final   Cryptococcus neoformans/gattii NOT DETECTED NOT DETECTED Final   Meth resistant mecA/C and MREJ NOT DETECTED NOT DETECTED Final    Comment: Performed at [facility], [address] [place], [place] [address]    BCID Results: Now 4 of 4 bottles w/ Staph aureus, no resistance detected.  Name of provider contacted: WENDI Cone, NP   Changes to prescribed antibiotics required: Transition pt to Cefazolin .  Rankin CANDIE Dills, PharmD, [facility] 08/30/2023 4:37 AM

## 2023-08-30 NOTE — Progress Notes (Signed)
 Patient remains total care. Continues to have uncontrolled body movements.  PIV to right forearm remain intact.  Patient continues to have purewick device for episodes of urinary incontinence.  Patient has not voided during night shift.  Unable to administer any PO medications due to decreased alert state.  Oral mucosa remains dry, red.  Lips continue to exhibit excessive dry appearance. Skin remains intact, upon inspection.  Patient to continue to be monitored until d/c'd.

## 2023-08-30 NOTE — Progress Notes (Addendum)
 Spoke w/ brother at bedside NG unable to be placed today - pt did not tolerate procedure  I think would be reasonable to have IR assess tomorrow for feeding tube and cyst aspiration - may consider NG w/ radiology, but may make more sense just going ahead w/ G tube; also can consider aspiration of pelvic cyst/abscess at the same time if reasonable.   Brother is in agreement with plan for IR to assess tomorrow and give recommendations. He will speak w/ his other brother about this as well. WIll place consult for IR to see her tomorrow.   Additionally, unable to secure IV access, cannot place PICC d/t bacteremia, midline ok if needed, RN was able to secure PIV for now  Additional time spent 35 min

## 2023-08-30 NOTE — Progress Notes (Signed)
 NEUROLOGY CONSULT FOLLOW UP NOTE   Date of service: August 30, 2023 Patient Name: Tracey Wyatt MRN:  [id number] DOB:  [birth year]   Interval Hx/subjective   Both her sister and the patient think that her movements have improved somewhat, but they are still very prominent  Vitals   Vitals:   08/29/23 2011 08/30/23 0455 08/30/23 0603 08/30/23 0824  BP: 128/81 131/64 113/79 119/80  Pulse:  (!) 43 (!) 52 60  Resp: 16 20 20 16   Temp: (!) 97.3 F (36.3 C) 98.9 F (37.2 C) 97.9 F (36.6 C) 99.7 F (37.6 C)  TempSrc:      SpO2: 91% 95% 94% 94%  Weight:      Height:         Body mass index is 12.88 kg/m.  Physical Exam   General: In bed, keeps eyes tightly shut  Neurologic Examination   MS: She is awake, but with eyes tightly shut.  She is able to follow commands.  She has significant difficulty with initiating speech.  CN: Eyes are midline, not deviated she has significant abnormal facial movements.  Motor: She has constant choreiform movements. Sensory: She endorses bilateral sensation  Labs and Diagnostic Imaging   CBC:  Recent Labs  Lab 08/25/23 2046 08/29/23 1143 08/30/23 0917  WBC 5.5 23.0* 27.3*  NEUTROABS 3.6 21.2*  --   HGB 10.6* 10.3* 10.0*  HCT 31.5* 29.6* 28.7*  MCV 90.0 87.3 86.2  PLT 204 273 247    Basic Metabolic Panel:  Lab Results  Component Value Date   NA 131 (L) 08/30/2023   K 2.5 (LL) 08/30/2023   CO2 22 08/30/2023   GLUCOSE 99 08/30/2023   BUN 9 08/30/2023   CREATININE <0.30 (L) 08/30/2023   CALCIUM 6.9 (L) 08/30/2023   GFRNONAA NOT CALCULATED 08/30/2023   GFRAA >60 04/03/2016   Impression   Tracey Wyatt is a 68 y.o. 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   She has a history of significant psychiatric issues including schizophrenia.  It is possible that these movements are tardive in nature, in fact I suspect so.   Additionally, it is not clear that  she truly has a significant seizure history.  As Keppra can be associated with mood disturbances and given her significant psychiatric history I do favor stopping the Keppra for now.   Chorea of various types can respond to neuroleptics, and even if tardive, this could result in some short-term improvement. Given the urgency of getting her to eat and drink, I favored trialing this to see if there is improvement.  I discussed with her brother and his wife that I suspect that there is some underlying neurodegenerative condition going on given the progressive nature, even in the absence of neuroleptics for the past few years.  If no improvement by tomorrow, would consider increasing olanzapine .    Recommendations  Olanzapine  2.5 mg IM twice daily Continue benztropine  1 mg 3 times daily Continue Depacon  120mg  every 8 hours Neurology to follow ______________________________________________________________________   Thank you for the opportunity to take part in the care of this patient. If you have any further questions, please contact the neurology consultation team on call. Updated oncall schedule is listed on AMION.  Signed,  Aisha Seals, MD [facility] [phone]  If 7pm- 7am, please page neurology on call as listed in AMION.

## 2023-08-30 NOTE — Progress Notes (Signed)
 Daily Progress Note   Patient Name: Tracey Wyatt       Date: 08/30/2023 DOB: 23-Jul-1955  Age: 68 y.o. MRN#: [id number] Attending Physician: Marsa Edelman, DO Primary Care Physician: Alois Charm, MD (Inactive) Admit Date: 08/25/2023  Reason for Consultation/Follow-up: Establishing goals of care  HPI/Brief Hospital Review:  68 y.o. female  with past medical history of mood disorder since early childhood, progressive movement disorder, HTN, HLD, dilated cardiomyopathy and possible seizure disorder admitted from [facility] on 08/25/2023 with progressive functional and mental decline over last several months with most recent decline becoming non-verbal and somnolent. Poor PO intake for several months and within last several weeks, intake has been minimal. Also noted increase in behavioral problems at facility recently.   Neurology following closely for significant decompensation of chronic progressive movement disorder--medications being adjusted, labs.testing being obtained for possible reversible causes, concern for possible underlying malignancy due to poor PO intake and weight loss--unable to obtain MRI due to movement disorder, would require sedation   Discussions being had regarding potential for recovery with need to consider PEG tube placement if possible to improve function and QOL versus considering comfort care/hospice care if recovery felt to be unlikely   Neurology started olanzapine  2/8   Palliative medicine was consulted for assisting with goals of care conversations.  Subjective: Extensive chart review has been completed prior to meeting patient including labs, vital signs, imaging, progress notes, orders, and available advanced directive documents from  current and previous encounters.    Visited with Tracey Wyatt at her bedside. Brother and sister in law at bedside during visit. They share when they arrived earlier this AM, Tracey Wyatt was alert and watching television. They also feel her movements are improved.  Neurology to bedside during visit. Able to follow commands. Movements remain. Plan to continue olanzapine . Family wishes to proceed with attempting NG placement for artificial nutrition.  Notified by nursing staff, NGT attempts unsuccessful.  CT revealed pelvic cyst with rectum compression and splenic cyst--blood cultures positive for staph aureus ID consulted-recommend IR drainage of pelvic cyst  Met with Richard at bedside later in day. He shares he is aware of IR cyst drainage and he and his brother considering NG placement by IR as well.  Family continues to be faced with pursuing aggressive interventions and consideration  of NG/PEG placement.  Answered and addressed all questions and concerns. PMT to continue to follow for ongoing needs and support.  Care plan was discussed with primary team.  Thank you for allowing the Palliative Medicine Team to assist in the care of this patient.  Total time:  35 minutes  Time spent includes: Detailed review of medical records (labs, imaging, vital signs), medically appropriate exam (mental status, respiratory, cardiac, skin), discussed with treatment team, counseling and educating patient, family and staff, documenting clinical information, medication management and coordination of care.  Waddell Lesches, DNP, AGNP-C Palliative Medicine   Please contact Palliative Medicine Team phone at [phone] for questions and concerns.

## 2023-08-30 NOTE — Progress Notes (Signed)
 PHARMACY - PHYSICIAN COMMUNICATION CRITICAL VALUE ALERT - BLOOD CULTURE IDENTIFICATION (BCID)  Results for orders placed or performed during the hospital encounter of 08/25/23  MRSA Next Gen by PCR, Nasal     Status: None   Collection Time: 08/27/23  6:00 AM   Specimen: Nasal Mucosa; Nasal Swab  Result Value Ref Range Status   MRSA by PCR Next Gen NOT DETECTED NOT DETECTED Final    Comment: (NOTE) The GeneXpert MRSA Assay (FDA approved for NASAL specimens only), is one component of a comprehensive MRSA colonization surveillance program. It is not intended to diagnose MRSA infection nor to guide or monitor treatment for MRSA infections. Test performance is not FDA approved in patients less than 102 years old. Performed at [facility], [address] [place], [place] [address]   Culture, blood (Routine X 2) w Reflex to ID Panel     Status: None (Preliminary result)   Collection Time: 08/29/23  3:09 PM   Specimen: BLOOD  Result Value Ref Range Status   Specimen Description BLOOD RAC  Final   Special Requests   Final    BOTTLES DRAWN AEROBIC AND ANAEROBIC Blood Culture adequate volume   Culture  Setup Time   Final    GRAM POSITIVE COCCI IN BOTH AEROBIC AND ANAEROBIC BOTTLES Organism ID to follow STAPHYLOCOCCUS AUREUS CRITICAL RESULT CALLED TO, READ BACK BY AND VERIFIED WITH: Tracey Wyatt @ 0124 08/30/23 [facility] Performed at [facility], [address] [place], [place] [address]    Culture PENDING  Incomplete   Report Status PENDING  Incomplete  Blood Culture ID Panel (Reflexed)     Status: Abnormal   Collection Time: 08/29/23  3:09 PM  Result Value Ref Range Status   Enterococcus faecalis NOT DETECTED NOT DETECTED Final   Enterococcus Faecium NOT DETECTED NOT DETECTED Final   Listeria monocytogenes NOT DETECTED NOT DETECTED Final   Staphylococcus species DETECTED (A) NOT DETECTED Final    Comment: CRITICAL RESULT CALLED TO, READ BACK BY AND VERIFIED  WITH: Tracey Wyatt @ 0124 08/30/23 [facility]    Staphylococcus aureus (BCID) DETECTED (A) NOT DETECTED Final    Comment: CRITICAL RESULT CALLED TO, READ BACK BY AND VERIFIED WITH: Tracey Wyatt @ 0124 08/30/23 [facility]    Staphylococcus epidermidis NOT DETECTED NOT DETECTED Final   Staphylococcus lugdunensis NOT DETECTED NOT DETECTED Final   Streptococcus species NOT DETECTED NOT DETECTED Final   Streptococcus agalactiae NOT DETECTED NOT DETECTED Final   Streptococcus pneumoniae NOT DETECTED NOT DETECTED Final   Streptococcus pyogenes NOT DETECTED NOT DETECTED Final   A.calcoaceticus-baumannii NOT DETECTED NOT DETECTED Final   Bacteroides fragilis NOT DETECTED NOT DETECTED Final   Enterobacterales NOT DETECTED NOT DETECTED Final   Enterobacter cloacae complex NOT DETECTED NOT DETECTED Final   Escherichia coli NOT DETECTED NOT DETECTED Final   Klebsiella aerogenes NOT DETECTED NOT DETECTED Final   Klebsiella oxytoca NOT DETECTED NOT DETECTED Final   Klebsiella pneumoniae NOT DETECTED NOT DETECTED Final   Proteus species NOT DETECTED NOT DETECTED Final   Salmonella species NOT DETECTED NOT DETECTED Final   Serratia marcescens NOT DETECTED NOT DETECTED Final   Haemophilus influenzae NOT DETECTED NOT DETECTED Final   Neisseria meningitidis NOT DETECTED NOT DETECTED Final   Pseudomonas aeruginosa NOT DETECTED NOT DETECTED Final   Stenotrophomonas maltophilia NOT DETECTED NOT DETECTED Final   Candida albicans NOT DETECTED NOT DETECTED Final   Candida auris NOT DETECTED NOT DETECTED Final   Candida glabrata NOT DETECTED NOT  DETECTED Final   Candida krusei NOT DETECTED NOT DETECTED Final   Candida parapsilosis NOT DETECTED NOT DETECTED Final   Candida tropicalis NOT DETECTED NOT DETECTED Final   Cryptococcus neoformans/gattii NOT DETECTED NOT DETECTED Final   Meth resistant mecA/C and MREJ NOT DETECTED NOT DETECTED Final    Comment: Performed at [facility], [address] [place],  [place] [address]    BCID Results: 2 (same set) of 4 bottles w/ Staph aureus, no resistance detected.  Pt currently on Ceftriaxone  & Vancomycin .  Pt remains afebrile, but WBC 23.  Name of provider contacted: Tracey Cone, NP   Changes to prescribed antibiotics required: Defer reassessment to day team and continue with current abx pending additional lab cx results  Tracey Wyatt, PharmD, [facility] 08/30/2023 1:58 AM

## 2023-08-30 NOTE — Consult Note (Addendum)
 NAME: Tracey Wyatt  DOB: Dec 06, 1955  MRN: [id number]  Date/Time: 08/30/2023 11:23 AM  REQUESTING PROVIDER:Dr.Alexander Subjective:  REASON FOR CONSULT: MSSA abcteremia ?Brother at bed side. No history from patient Tracey Wyatt is a 68 y.o. with a history of hypertension, schizophrenia, prior substance use tardive dyskinesia secondary to Haldol  and risperidone  which she has been off for 10 years now epilepsy, and falls, lives in a facility, with multiple presentations to the ED in the last 4 months came to the ED on 08/24/2023 brought in by EMS from the facility complaining of altered mental status.  A as per ED note they talked to his her brother and he had said that patient has been in general decline for the past 2 months.  Increasingly weak and less interactive and also been eating and drinking less.  She resides at [facility] and now has been difficulty in walking and caring for herself there.  From the ED her workup including labs CT brain UA were unrevealing so that she was sent back to the long-term care facility but she returned to the ED on 08/26/2023 with worsening symptoms especially noncommunicative So on 08/26/2023 during the return presentation she was admitted..  Vitals in the ED on 08/26/2023 was 126/88, temperature 99.1, pulse 92, respiratory rate 18 and sats of 91% Labs on done on 08/25/2023 was WBC of 5.5, Hb 10.6, hematocrit 31.5 and platelet 204. CT of the head done on 08/24/2023 was no acute intracranial abnormality.  Changes related to chronic microvascular ischemic disease.  Chest x-ray done on 08/24/2023 was normal.  She was admitted to the hospital service as change in mental status with a long psychiatric history of remote history of heavy alcohol  use with progressive decline.  There was concern for frontal lobe dementia versus Lewy body dementia versus Alzheimer's disease versus CJD. EEG was ordered and it showed mild diffuse encephalopathy.  No seizures or  epileptiform discharges were seen. Neuro was consulted and they saw her on 08/28/2023 and ordered an extensive workup for dementia including B1, B3 B6 B12 MMA, folate HIV RPR TSH ammonia ESR CRP vitamin D , ANA, SPEP IFE, started on 100 mg IV thiamine .  They ordered CT chest abdomen pelvis to evaluate for occult malignancy.  They were concerned about medication related cause and and recommended that the benztropine  dose be reduced to 1 mg 3 times daily rather than 2 mg.  And also consider reducing Depakote  dose. Blood culture was sent on 08/29/2023 because of uptake in WBC to 23 on 08/29/2023.  The cultures come back positive for Staph aureus and I am seeing the patient for the same. Patient has been started on cefazolin . Past Medical History:  Diagnosis Date   Arthritis    Depression    GERD (gastroesophageal reflux disease)    Heart murmur    History of hiatal hernia    Hypertension    Pyromania    Seizures (HCC)    LAST ONE IN 2002 APPROXIMATELY   Substance abuse (HCC)     Past Surgical History:  Procedure Laterality Date   CHOLECYSTECTOMY N/A 04/08/2016   Procedure: LAPAROSCOPIC CHOLECYSTECTOMY WITH INTRAOPERATIVE CHOLANGIOGRAM;  Surgeon: Ozell DELENA Mano, MD;  Location: [facility];  Service: General;  Laterality: N/A;   COLONOSCOPY     COLONOSCOPY WITH PROPOFOL  N/A 11/18/2016   Procedure: COLONOSCOPY WITH PROPOFOL ;  Surgeon: Ruel Kung, MD;  Location: [facility];  Service: Endoscopy;  Laterality: N/A;   ESOPHAGOGASTRODUODENOSCOPY N/A 04/08/2016  Procedure: ESOPHAGOGASTRODUODENOSCOPY (EGD);  Surgeon: Ozell DELENA Mano, MD;  Location: [facility];  Service: General;  Laterality: N/A;   FOOT SURGERY Bilateral    LAPAROSCOPY N/A 04/08/2016   Procedure: LAPAROSCOPY DIAGNOSTIC;  Surgeon: Ozell DELENA Mano, MD;  Location: [facility];  Service: General;  Laterality: N/A;    Social History   Socioeconomic History   Marital status: Single    Spouse name: Not on file   Number of children: Not on file    Years of education: Not on file   Highest education level: Not on file  Occupational History   Not on file  Tobacco Use   Smoking status: Former    Current packs/day: 0.00    Average packs/day: 0.5 packs/day for 10.0 years (5.0 ttl pk-yrs)    Types: Cigarettes    Start date: 03/31/1986    Quit date: 03/31/1996    Years since quitting: 27.4   Smokeless tobacco: Never  Vaping Use   Vaping status: Never Used  Substance and Sexual Activity   Alcohol  use: No   Drug use: Not Currently   Sexual activity: Not Currently    Birth control/protection: Post-menopausal  Other Topics Concern   Not on file  Social History Narrative   Not on file   Social Drivers of Health   Financial Resource Strain: Not on file  Food Insecurity: No Food Insecurity (08/26/2023)   Hunger Vital Sign    Worried About Running Out of Food in the Last Year: Never true    Ran Out of Food in the Last Year: Never true  Transportation Needs: No Transportation Needs (08/26/2023)   PRAPARE - Administrator, Civil Service (Medical): No    Lack of Transportation (Non-Medical): No  Physical Activity: Not on file  Stress: Not on file  Social Connections: Unknown (08/26/2023)   Social Connection and Isolation Panel [NHANES]    Frequency of Communication with Friends and Family: Patient unable to answer    Frequency of Social Gatherings with Friends and Family: Three times a week    Attends Religious Services: Patient unable to answer    Active Member of Clubs or Organizations: Patient unable to answer    Attends Club or Organization Meetings: Never    Marital Status: Divorced  Intimate Partner Violence: Patient Unable To Answer (08/26/2023)   Humiliation, Afraid, Rape, and Kick questionnaire    Fear of Current or Ex-Partner: Patient unable to answer    Emotionally Abused: Patient unable to answer    Physically Abused: Patient unable to answer    Sexually Abused: Patient unable to answer    History reviewed. No  pertinent family history. Allergies  Allergen Reactions   A+D Diaper Rash [Diaper Rash Products]    Morphine Other (See Comments)    Other Reaction: Not Assessed   Procaine Other (See Comments)   I? Current Facility-Administered Medications  Medication Dose Route Frequency Provider Last Rate Last Admin   aspirin  tablet 325 mg  325 mg Oral Daily Norins, Michael E, MD   325 mg at 08/30/23 0915   benztropine  (COGENTIN ) tablet 1 mg  1 mg Oral TID Bhagat, Srishti L, MD   1 mg at 08/30/23 0910   ceFAZolin  (ANCEF ) IVPB 2g/100 mL premix  2 g Intravenous Q8H Merrill, Kristin A, RPH       enoxaparin  (LOVENOX ) injection 30 mg  30 mg Subcutaneous Q24H Norins, Michael E, MD   30 mg at 08/30/23 0916   ferrous sulfate  (FER-IN-SOL) 75 (15  Fe) MG/ML solution 15 mg of iron  15 mg of iron Oral Daily Norins, Michael E, MD   15 mg of iron at 08/30/23 9083   hydrALAZINE  (APRESOLINE ) injection 2 mg  2 mg Intravenous Q6H PRN Norins, Michael E, MD       LORazepam  (ATIVAN ) injection 0.5 mg  0.5 mg Intravenous Q4H PRN Norins, Michael E, MD   0.5 mg at 08/26/23 1522   OLANZapine  (ZYPREXA ) injection 2.5 mg  2.5 mg Intramuscular BID Michaela Aisha SQUIBB, MD   2.5 mg at 08/30/23 9089   pantoprazole  (PROTONIX ) injection 40 mg  40 mg Intravenous QHS Norins, Michael E, MD   40 mg at 08/29/23 2304   polyethylene glycol (MIRALAX  / GLYCOLAX ) packet 17 g  17 g Oral Daily Norins, Michael E, MD       potassium chloride  10 mEq in 100 mL IVPB  10 mEq Intravenous Q1 Hr x 4 Alexander, Natalie, DO 100 mL/hr at 08/30/23 1053 10 mEq at 08/30/23 1053   thiamine  (VITAMIN B1) injection 100 mg  100 mg Intravenous Daily Bhagat, Srishti L, MD   100 mg at 08/30/23 0914   valproate (DEPACON ) 120 mg in dextrose  5 % 50 mL IVPB  120 mg Intravenous Q8H Marsa Edelman, DO   Stopped at 08/30/23 [phone]     Abtx:  Anti-infectives (From admission, onward)    Start     Dose/Rate Route Frequency Ordered Stop   08/30/23 1900  vancomycin   (VANCOREADY) IVPB 500 mg/100 mL  Status:  Discontinued        500 mg 100 mL/hr over 60 Minutes Intravenous Every 24 hours 08/29/23 1839 08/30/23 0437   08/30/23 1400  ceFAZolin  (ANCEF ) IVPB 2g/100 mL premix        2 g 200 mL/hr over 30 Minutes Intravenous Every 8 hours 08/30/23 0825     08/30/23 0600  ceFAZolin  (ANCEF ) IVPB 1 g/50 mL premix  Status:  Discontinued        1 g 100 mL/hr over 30 Minutes Intravenous Every 8 hours 08/30/23 0436 08/30/23 0825   08/29/23 1830  cefTRIAXone  (ROCEPHIN ) 1 g in sodium chloride  0.9 % 100 mL IVPB  Status:  Discontinued        1 g 200 mL/hr over 30 Minutes Intravenous Every 24 hours 08/29/23 1727 08/30/23 0436   08/29/23 1830  vancomycin  (VANCOCIN ) 750 mg in sodium chloride  0.9 % 250 mL IVPB        750 mg 250 mL/hr over 60 Minutes Intravenous Once 08/29/23 1734 08/29/23 2221       REVIEW OF SYSTEMS:  Unable to get from patient Objective:  VITALS:  BP 119/80 (BP Location: Left Arm)   Pulse 60   Temp 99.7 F (37.6 C)   Resp 16   Ht 5' 6 (1.676 m)   Wt 36.2 kg   SpO2 94%   BMI 12.88 kg/m   PHYSICAL EXAM:  General: Awake, responds to simple questions- because  of dyskineisa of tongue unable to comprehend her speech emaciated tardive dyskinesia of face, mouth , tongue  Eyes: Conjunctivae clear, anicteric sclerae. Pupils are equal ENT Nares normal. No drainage or sinus tenderness. Lips, mucosa, and tongue normal. No Thrush edentulous Neck: Supple, symmetrical, no adenopathy, thyroid: non tender no carotid bruit and no JVD. Back: No CVA tenderness. Lungs: b/l air entry. Heart: s1s2. Abdomen: Soft, non-tender,not distended. Bowel sounds normal. No masses Extremities: atraumatic, no cyanosis. No edema. No clubbing Skin: No rashes or lesions. Or bruising Lymph:  Cervical, supraclavicular normal. Neurologic: Grossly non-focal Pertinent Labs Lab Results CBC    Component Value Date/Time   WBC 27.3 (H) 08/30/2023 0917   RBC 3.33 (L)  08/30/2023 0917   HGB 10.0 (L) 08/30/2023 0917   HCT 28.7 (L) 08/30/2023 0917   PLT 247 08/30/2023 0917   MCV 86.2 08/30/2023 0917   MCH 30.0 08/30/2023 0917   MCHC 34.8 08/30/2023 0917   RDW 11.4 (L) 08/30/2023 0917   LYMPHSABS 0.6 (L) 08/29/2023 1143   MONOABS 1.1 (H) 08/29/2023 1143   EOSABS 0.0 08/29/2023 1143   BASOSABS 0.1 08/29/2023 1143       Latest Ref Rng & Units 08/30/2023    9:17 AM 08/29/2023    5:49 PM 08/25/2023    7:24 PM  CMP  Glucose 70 - 99 mg/dL 99   95   BUN 8 - 23 mg/dL 9   39   Creatinine 9.55 - 1.00 mg/dL <9.69  9.66  9.49   Sodium 135 - 145 mmol/L 131   141   Potassium 3.5 - 5.1 mmol/L 2.5   4.0   Chloride 98 - 111 mmol/L 97   106   CO2 22 - 32 mmol/L 22   26   Calcium 8.9 - 10.3 mg/dL 6.9   8.3   Total Protein 6.5 - 8.1 g/dL 4.7   5.4   Total Bilirubin 0.0 - 1.2 mg/dL 0.5   0.8   Alkaline Phos 38 - 126 U/L 67   36   AST 15 - 41 U/L 20   16   ALT 0 - 44 U/L 14   14       Microbiology: Recent Results (from the past 240 hours)  Resp panel by RT-PCR (RSV, Flu A&B, Covid) Urine, Catheterized     Status: None   Collection Time: 08/24/23 10:36 PM   Specimen: Urine, Catheterized; Nasal Swab  Result Value Ref Range Status   SARS Coronavirus 2 by RT PCR NEGATIVE NEGATIVE Final    Comment: (NOTE) SARS-CoV-2 target nucleic acids are NOT DETECTED.  The SARS-CoV-2 RNA is generally detectable in upper respiratory specimens during the acute phase of infection. The lowest concentration of SARS-CoV-2 viral copies this assay can detect is 138 copies/mL. A negative result does not preclude SARS-Cov-2 infection and should not be used as the sole basis for treatment or other patient management decisions. A negative result may occur with  improper specimen collection/handling, submission of specimen other than nasopharyngeal swab, presence of viral mutation(s) within the areas targeted by this assay, and inadequate number of viral copies(<138 copies/mL). A  negative result must be combined with clinical observations, patient history, and epidemiological information. The expected result is Negative.  Fact Sheet for Patients:  bloggercourse.com  Fact Sheet for Healthcare Providers:  seriousbroker.it  This test is no t yet approved or cleared by the [place]  FDA and  has been authorized for detection and/or diagnosis of SARS-CoV-2 by FDA under an Emergency Use Authorization (EUA). This EUA will remain  in effect (meaning this test can be used) for the duration of the COVID-19 declaration under Section 564(b)(1) of the Act, 21 U.S.C.section 360bbb-3(b)(1), unless the authorization is terminated  or revoked sooner.       Influenza A by PCR NEGATIVE NEGATIVE Final   Influenza B by PCR NEGATIVE NEGATIVE Final    Comment: (NOTE) The Xpert Xpress SARS-CoV-2/FLU/RSV plus assay is intended as an aid in the diagnosis of influenza from Nasopharyngeal swab specimens and should  not be used as a sole basis for treatment. Nasal washings and aspirates are unacceptable for Xpert Xpress SARS-CoV-2/FLU/RSV testing.  Fact Sheet for Patients: bloggercourse.com  Fact Sheet for Healthcare Providers: seriousbroker.it  This test is not yet approved or cleared by the [place]  FDA and has been authorized for detection and/or diagnosis of SARS-CoV-2 by FDA under an Emergency Use Authorization (EUA). This EUA will remain in effect (meaning this test can be used) for the duration of the COVID-19 declaration under Section 564(b)(1) of the Act, 21 U.S.C. section 360bbb-3(b)(1), unless the authorization is terminated or revoked.     Resp Syncytial Virus by PCR NEGATIVE NEGATIVE Final    Comment: (NOTE) Fact Sheet for Patients: bloggercourse.com  Fact Sheet for Healthcare  Providers: seriousbroker.it  This test is not yet approved or cleared by the [place]  FDA and has been authorized for detection and/or diagnosis of SARS-CoV-2 by FDA under an Emergency Use Authorization (EUA). This EUA will remain in effect (meaning this test can be used) for the duration of the COVID-19 declaration under Section 564(b)(1) of the Act, 21 U.S.C. section 360bbb-3(b)(1), unless the authorization is terminated or revoked.  Performed at [facility], [address] [place], [place] [address]   MRSA Next Gen by PCR, Nasal     Status: None   Collection Time: 08/27/23  6:00 AM   Specimen: Nasal Mucosa; Nasal Swab  Result Value Ref Range Status   MRSA by PCR Next Gen NOT DETECTED NOT DETECTED Final    Comment: (NOTE) The GeneXpert MRSA Assay (FDA approved for NASAL specimens only), is one component of a comprehensive MRSA colonization surveillance program. It is not intended to diagnose MRSA infection nor to guide or monitor treatment for MRSA infections. Test performance is not FDA approved in patients less than 85 years old. Performed at [facility], [address] [place], [place] [address]   Culture, blood (Routine X 2) w Reflex to ID Panel     Status: None (Preliminary result)   Collection Time: 08/29/23  3:09 PM   Specimen: BLOOD  Result Value Ref Range Status   Specimen Description BLOOD RAC  Final   Special Requests   Final    BOTTLES DRAWN AEROBIC AND ANAEROBIC Blood Culture adequate volume   Culture  Setup Time   Final    GRAM POSITIVE COCCI IN BOTH AEROBIC AND ANAEROBIC BOTTLES Organism ID to follow CRITICAL RESULT CALLED TO, READ BACK BY AND VERIFIED WITHBETHA RANKIN DILLS @ 0124 08/30/23 [facility] Performed at [facility], [address] [place], [place] [address]    Culture GRAM POSITIVE COCCI  Final   Report Status PENDING  Incomplete  Culture, blood (Routine X 2) w Reflex to ID Panel     Status:  None (Preliminary result)   Collection Time: 08/29/23  3:09 PM   Specimen: BLOOD  Result Value Ref Range Status   Specimen Description BLOOD LAC  Final   Special Requests   Final    BOTTLES DRAWN AEROBIC AND ANAEROBIC Blood Culture adequate volume   Culture  Setup Time   Final    GRAM POSITIVE COCCI IN BOTH AEROBIC AND ANAEROBIC BOTTLES CRITICAL RESULT CALLED TO, READ BACK BY AND VERIFIED WITHBETHA RANKIN DILLS @ 9666 08/30/23 [facility] Performed at [facility], [address] [place], [place] [address]    Culture [facility] POSITIVE COCCI  Final   Report Status PENDING  Incomplete  Blood Culture ID Panel (Reflexed)  Status: Abnormal   Collection Time: 08/29/23  3:09 PM  Result Value Ref Range Status   Enterococcus faecalis NOT DETECTED NOT DETECTED Final   Enterococcus Faecium NOT DETECTED NOT DETECTED Final   Listeria monocytogenes NOT DETECTED NOT DETECTED Final   Staphylococcus species DETECTED (A) NOT DETECTED Final    Comment: CRITICAL RESULT CALLED TO, READ BACK BY AND VERIFIED WITH: NATHAN BELUE @ 0124 08/30/23 [facility]    Staphylococcus aureus (BCID) DETECTED (A) NOT DETECTED Final    Comment: CRITICAL RESULT CALLED TO, READ BACK BY AND VERIFIED WITH: NATHAN BELUE @ 0124 08/30/23 [facility]    Staphylococcus epidermidis NOT DETECTED NOT DETECTED Final   Staphylococcus lugdunensis NOT DETECTED NOT DETECTED Final   Streptococcus species NOT DETECTED NOT DETECTED Final   Streptococcus agalactiae NOT DETECTED NOT DETECTED Final   Streptococcus pneumoniae NOT DETECTED NOT DETECTED Final   Streptococcus pyogenes NOT DETECTED NOT DETECTED Final   A.calcoaceticus-baumannii NOT DETECTED NOT DETECTED Final   Bacteroides fragilis NOT DETECTED NOT DETECTED Final   Enterobacterales NOT DETECTED NOT DETECTED Final   Enterobacter cloacae complex NOT DETECTED NOT DETECTED Final   Escherichia coli NOT DETECTED NOT DETECTED Final   Klebsiella aerogenes NOT DETECTED NOT DETECTED Final   Klebsiella  oxytoca NOT DETECTED NOT DETECTED Final   Klebsiella pneumoniae NOT DETECTED NOT DETECTED Final   Proteus species NOT DETECTED NOT DETECTED Final   Salmonella species NOT DETECTED NOT DETECTED Final   Serratia marcescens NOT DETECTED NOT DETECTED Final   Haemophilus influenzae NOT DETECTED NOT DETECTED Final   Neisseria meningitidis NOT DETECTED NOT DETECTED Final   Pseudomonas aeruginosa NOT DETECTED NOT DETECTED Final   Stenotrophomonas maltophilia NOT DETECTED NOT DETECTED Final   Candida albicans NOT DETECTED NOT DETECTED Final   Candida auris NOT DETECTED NOT DETECTED Final   Candida glabrata NOT DETECTED NOT DETECTED Final   Candida krusei NOT DETECTED NOT DETECTED Final   Candida parapsilosis NOT DETECTED NOT DETECTED Final   Candida tropicalis NOT DETECTED NOT DETECTED Final   Cryptococcus neoformans/gattii NOT DETECTED NOT DETECTED Final   Meth resistant mecA/C and MREJ NOT DETECTED NOT DETECTED Final    Comment: Performed at [facility], [address] [place], [place] [address]    IMAGING RESULTS:  I have personally reviewed the films  ?5 cm posterior pelvic cystic collection on the left side   2cm splenic cyst   Impression/Recommendation Staph aureus bacteremia -high  bioburden 4/4 with time to positivity less than 12 hours.  The cultures were sent on 08/29/2023 and no blood culture was done on admissions.  So not sure whether this was present even before admission .unclear source Patient is now on cefazolin , continue 2 grms IV q8. Patient does not have any pacemaker or valve replacement No hardware Has a pelvic cyst of 5 cm ? Aspirate by IR if aggressive management pursued Has a splenic cyst Repeat blood culture 2 d echo  General decline in health  the past couple of months Poor appetite, not eating Weight loss over 2 years Neurocognitive impairment.   History of seizures Schizophrenia with medication induced tardive dyskinesia  CT abdomen shows  left posterior pelvic cystic collection of 5 cm Causing some compression on the rectum Recommend IR drain this and send it for culture CT abdomen showed splenic cyst of 2 cm  Protein calorie malnutrition with hypoalbuminemia Edema ankles Hypokalemia  HIV/RPR nonreactive ? _I have personally spent  -75--minutes involved in face-to-face and non-face-to-face activities for this  patient on the day of the visit. Professional time spent includes the following activities: Preparing to see the patient (review of tests), Obtaining and/or reviewing separately obtained history (admission/discharge record), Performing a medically appropriate examination and/or evaluation , Ordering medications/tests/procedures, referring and communicating with other health care professionals, Documenting clinical information in the EMR, Independently interpreting results (not separately reported), Communicating results to the patient/family/caregiver, Counseling and educating the patient/family/caregiver and Care coordination (not separately reported). Involved complex antimicrobial counseling and treatment    ________________________________________________ Discussed with patient, requesting provider Note:  This document was prepared using Dragon voice recognition software and may include unintentional dictation errors.

## 2023-08-30 NOTE — Progress Notes (Signed)
 PICC ordered.  Noted blood cx positive today.  Secure chat sent to Dr Francee Inch and Dr Authur Leghorn.  PICC order cancelled per ID.

## 2023-08-30 NOTE — Plan of Care (Signed)

## 2023-08-31 ENCOUNTER — Inpatient Hospital Stay: Payer: Medicare Other

## 2023-08-31 ENCOUNTER — Inpatient Hospital Stay
Admit: 2023-08-31 | Discharge: 2023-08-31 | Disposition: A | Discharge status: Home / Self Care | Payer: Medicare Other | Attending: Infectious Diseases | Admitting: Infectious Diseases

## 2023-08-31 DIAGNOSIS — Z7189 Other specified counseling: Secondary | ICD-10-CM | POA: Diagnosis not present

## 2023-08-31 LAB — CBC
HCT: 29.1 % — ABNORMAL LOW (ref 36.0–46.0)
Hemoglobin: 10.6 g/dL — ABNORMAL LOW (ref 12.0–15.0)
MCH: 30.4 pg (ref 26.0–34.0)
MCHC: 36.4 g/dL — ABNORMAL HIGH (ref 30.0–36.0)
MCV: 83.4 fL (ref 80.0–100.0)
Platelets: 265 10*3/uL (ref 150–400)
RBC: 3.49 MIL/uL — ABNORMAL LOW (ref 3.87–5.11)
RDW: 11.4 % — ABNORMAL LOW (ref 11.5–15.5)
WBC: 18.4 10*3/uL — ABNORMAL HIGH (ref 4.0–10.5)
nRBC: 0 % (ref 0.0–0.2)

## 2023-08-31 LAB — ECHOCARDIOGRAM COMPLETE
AR max vel: 2.63 cm2
AV Area VTI: 2.57 cm2
AV Area mean vel: 2.64 cm2
AV Mean grad: 2 mm[Hg]
AV Peak grad: 4.8 mm[Hg]
Ao pk vel: 1.1 m/s
Area-P 1/2: 4.31 cm2
Calc EF: 37.8 %
Height: 66 in
MV VTI: 1.8 cm2
S' Lateral: 3.1 cm
Single Plane A2C EF: 33.3 %
Single Plane A4C EF: 39.7 %
Weight: 1276.9 [oz_av]

## 2023-08-31 LAB — PHOSPHORUS: Phosphorus: 1.4 mg/dL — ABNORMAL LOW (ref 2.5–4.6)

## 2023-08-31 LAB — BASIC METABOLIC PANEL
Anion gap: 9 (ref 5–15)
BUN: 7 mg/dL — ABNORMAL LOW (ref 8–23)
CO2: 24 mmol/L (ref 22–32)
Calcium: 7.4 mg/dL — ABNORMAL LOW (ref 8.9–10.3)
Chloride: 100 mmol/L (ref 98–111)
Creatinine, Ser: 0.32 mg/dL — ABNORMAL LOW (ref 0.44–1.00)
GFR, Estimated: >60 mL/min (ref >60)
Glucose, Bld: 86 mg/dL (ref 70–99)
Potassium: 2.5 mmol/L — CL (ref 3.5–5.1)
Sodium: 133 mmol/L — ABNORMAL LOW (ref 135–145)

## 2023-08-31 LAB — MAGNESIUM: Magnesium: 2 mg/dL (ref 1.7–2.4)

## 2023-08-31 LAB — METHYLMALONIC ACID, SERUM: Methylmalonic Acid, Quantitative: 122 nmol/L (ref 0–378)

## 2023-08-31 LAB — VITAMIN B1: Vitamin B1 (Thiamine): 100.7 nmol/L (ref 66.5–200.0)

## 2023-08-31 MED ORDER — ACETAMINOPHEN 650 MG RE SUPP: 650.0000 mg | Freq: Four times a day (QID) | RECTAL | Status: DC | PRN

## 2023-08-31 MED ORDER — LORAZEPAM 2 MG/ML PO CONC: 1.0000 mg | ORAL | Status: DC | PRN

## 2023-08-31 MED ORDER — ACETAMINOPHEN 325 MG PO TABS: 650.0000 mg | ORAL_TABLET | Freq: Four times a day (QID) | ORAL | Status: DC | PRN

## 2023-08-31 MED ORDER — OXYCODONE HCL 20 MG/ML PO CONC: 5.0000 mg | ORAL | Status: DC | PRN

## 2023-08-31 MED ORDER — LORAZEPAM 2 MG/ML IJ SOLN: 1.0000 mg | INTRAMUSCULAR | Status: DC | PRN

## 2023-08-31 MED ORDER — POTASSIUM CHLORIDE 10 MEQ/100ML IV SOLN
10.0000 meq | INTRAVENOUS | Status: DC
  Administered 2023-08-31 (×3): 10 meq via INTRAVENOUS
  Filled 2023-08-31 (×3): qty 100

## 2023-08-31 MED ORDER — FENTANYL CITRATE PF 50 MCG/ML IJ SOSY: 12.5000 ug | PREFILLED_SYRINGE | Freq: Once | INTRAMUSCULAR | Status: DC | PRN

## 2023-08-31 MED ORDER — ONDANSETRON HCL 4 MG/2ML IJ SOLN: 4.0000 mg | Freq: Four times a day (QID) | INTRAMUSCULAR | Status: DC | PRN

## 2023-08-31 MED ORDER — HALOPERIDOL LACTATE 2 MG/ML PO CONC: 0.5000 mg | ORAL | Status: DC | PRN

## 2023-08-31 MED ORDER — LORAZEPAM 1 MG PO TABS: 1.0000 mg | ORAL_TABLET | ORAL | Status: DC | PRN

## 2023-08-31 MED ORDER — ONDANSETRON 4 MG PO TBDP: 4.0000 mg | ORAL_TABLET | Freq: Four times a day (QID) | ORAL | Status: DC | PRN

## 2023-08-31 MED ORDER — POTASSIUM CHLORIDE 10 MEQ/100ML IV SOLN
10.0000 meq | INTRAVENOUS | Status: AC
  Administered 2023-08-31 (×2): 10 meq via INTRAVENOUS
  Filled 2023-08-31: qty 100

## 2023-08-31 MED ORDER — GLYCOPYRROLATE 0.2 MG/ML IJ SOLN: 0.2000 mg | INTRAMUSCULAR | Status: DC | PRN

## 2023-08-31 MED ORDER — CHLORHEXIDINE GLUCONATE CLOTH 2 % EX PADS
6.0000 | MEDICATED_PAD | Freq: Every day | CUTANEOUS | Status: DC
  Administered 2023-08-31 – 2023-09-01 (×2): 6 via TOPICAL

## 2023-08-31 MED ORDER — BIOTENE DRY MOUTH MT LIQD: 15.0000 mL | OROMUCOSAL | Status: DC | PRN

## 2023-08-31 MED ORDER — GLYCOPYRROLATE 1 MG PO TABS: 1.0000 mg | ORAL_TABLET | ORAL | Status: DC | PRN

## 2023-08-31 MED ORDER — POLYVINYL ALCOHOL 1.4 % OP SOLN: 1.0000 [drp] | Freq: Four times a day (QID) | OPHTHALMIC | Status: DC | PRN

## 2023-08-31 MED ORDER — HALOPERIDOL LACTATE 5 MG/ML IJ SOLN: 0.5000 mg | INTRAMUSCULAR | Status: DC | PRN

## 2023-08-31 MED ORDER — HALOPERIDOL 0.5 MG PO TABS: 0.5000 mg | ORAL_TABLET | ORAL | Status: DC | PRN

## 2023-08-31 NOTE — Progress Notes (Signed)
 Tracey Wyatt with lab called to report a critical Potassium of 2.5. Made nurse and MD aware.

## 2023-08-31 NOTE — Progress Notes (Signed)
*  PRELIMINARY RESULTS* Echocardiogram 2D Echocardiogram has been performed.  [name] 08/31/2023, 2:10 PM

## 2023-08-31 NOTE — Consult Note (Deleted)
 Consultation Note Date: 08/31/2023   Patient Name: Tracey Wyatt  DOB: Aug 18, 1955  MRN: [id number]  Age / Sex: 68 y.o., female  PCP: Janis Melena, MD (Inactive) Referring Physician: Melodi Sprung, DO  Reason for Consultation: Establishing goals of care  HPI/Patient Profile: Tracey Wyatt, a 68 y/o with h/o HTN, tardive dyskinesia, vague history of seizure disorder, psychiatric disorders - depression, schizophrenia, pyromania. She took haldol  for many years and developed tardive diskinesia but has been of haldol  and risperidone  for >10 years. She has resided in a long term care facility for 20 years. . Over the past year she has slowly declined: losing weight, hair turned gray, she had behavior problems. For two months she has had a progressive decline and over the past several weeks has become withdrawn and less communicative, decreased PO intake, worsening weight loss, and progressive decline in ability to manage ADLs and ambulate. Her brother is the primary historian and has been involved with her care for years.   Clinical Assessment and Goals of Care: Notes and labs reviewed.  In to see patient.  Patient is currently resting in bed with brother at bedside.  She is extremely thin and frail.  Staff is currently trying to feed her and she is able to take a few bites of yogurt before she begins to cough.  Brother discusses that patient has had poor p.o. intake for the past 6 months to a year.  He discusses that she has had significant weight loss.   We discussed her diagnoses, prognosis, GOC, EOL wishes disposition and options.  Created space and opportunity for patient  to explore thoughts and feelings regarding current medical information.   A detailed discussion was had today regarding advanced directives.  Concepts specific to code status, artifical feeding and hydration, IV antibiotics and  rehospitalization were discussed.  The difference between an aggressive medical intervention path and a comfort care path was discussed.  Values and goals of care important to patient and family were attempted to be elicited.  Discussed limitations of medical interventions to prolong quality of life in some situations and discussed the concept of human mortality.  He discusses that they are a family of faith.  He discusses that he does not want his sister to suffer.  He discusses that he is weighing options on care moving forward.  Discussed multiple different scenarios.  He is aware that if the feeding tube is placed, she could easily remove it if she wants to.  We discussed that a feeding tube works to lengthen life, but does not help with quality of life.    SUMMARY OF RECOMMENDATIONS   PMT will follow  Prognosis:  Poor        Primary Diagnoses: Present on Admission:  Dementia (HCC)  Altered mental status   I have reviewed the medical record, interviewed the patient and family, and examined the patient. The following aspects are pertinent.  Past Medical History:  Diagnosis Date   Arthritis    Depression    GERD (  gastroesophageal reflux disease)    Heart murmur    History of hiatal hernia    Hypertension    Pyromania    Seizures (HCC)    LAST ONE IN 2002 APPROXIMATELY   Substance abuse (HCC)    Social History   Socioeconomic History   Marital status: Single    Spouse name: Not on file   Number of children: Not on file   Years of education: Not on file   Highest education level: Not on file  Occupational History   Not on file  Tobacco Use   Smoking status: Former    Current packs/day: 0.00    Average packs/day: 0.5 packs/day for 10.0 years (5.0 ttl pk-yrs)    Types: Cigarettes    Start date: 03/31/1986    Quit date: 03/31/1996    Years since quitting: 27.4   Smokeless tobacco: Never  Vaping Use   Vaping status: Never Used  Substance and Sexual Activity    Alcohol  use: No   Drug use: Not Currently   Sexual activity: Not Currently    Birth control/protection: Post-menopausal  Other Topics Concern   Not on file  Social History Narrative   Not on file   Social Drivers of Health   Financial Resource Strain: Not on file  Food Insecurity: No Food Insecurity (08/26/2023)   Hunger Vital Sign    Worried About Running Out of Food in the Last Year: Never true    Ran Out of Food in the Last Year: Never true  Transportation Needs: No Transportation Needs (08/26/2023)   PRAPARE - Administrator, Civil Service (Medical): No    Lack of Transportation (Non-Medical): No  Physical Activity: Not on file  Stress: Not on file  Social Connections: Unknown (08/26/2023)   Social Connection and Isolation Panel [NHANES]    Frequency of Communication with Friends and Family: Patient unable to answer    Frequency of Social Gatherings with Friends and Family: Three times a week    Attends Religious Services: Patient unable to answer    Active Member of Clubs or Organizations: Patient unable to answer    Attends Club or Organization Meetings: Never    Marital Status: Divorced   History reviewed. No pertinent family history. Scheduled Meds:  aspirin   325 mg Oral Daily   benztropine   1 mg Oral TID   enoxaparin  (LOVENOX ) injection  30 mg Subcutaneous Q24H   ferrous sulfate   15 mg of iron Oral Daily   OLANZapine   2.5 mg Intramuscular BID   pantoprazole  (PROTONIX ) IV  40 mg Intravenous QHS   polyethylene glycol  17 g Oral Daily   thiamine  (VITAMIN B1) injection  100 mg Intravenous Daily   Continuous Infusions:   ceFAZolin  (ANCEF ) IV 2 g (08/31/23 0511)   potassium chloride  10 mEq (08/31/23 1008)   valproate sodium  120 mg (08/31/23 1138)   PRN Meds:.hydrALAZINE , LORazepam  Medications Prior to Admission:  Prior to Admission medications   Medication Sig Start Date End Date Taking? Authorizing Provider  Acetaminophen  Extra Strength 500 MG TABS Take  1,000 mg by mouth in the morning and at bedtime.   Yes [provider]  alendronate (FOSAMAX) 70 MG tablet Take 70 mg by mouth once a week. Take with a full glass of water on an empty stomach EVERY WEDNESDAY   Yes [provider]  aspirin  325 MG tablet Take 325 mg by mouth daily.   Yes [provider]  benztropine  (COGENTIN ) 2 MG tablet Take  2 mg by mouth 3 (three) times daily. 07/20/23  Yes [provider]  busPIRone (BUSPAR) 7.5 MG tablet Take 7.5 mg by mouth 2 (two) times daily. 08/17/23  Yes [provider]  Cholecalciferol (VITAMIN D3) 50 MCG (2000 UT) TABS Take 50 mcg by mouth daily.   Yes [provider]  divalproex  (DEPAKOTE ) 250 MG DR tablet Take 250 mg by mouth daily.   Yes [provider]  esomeprazole (NEXIUM) 40 MG capsule Take 40 mg by mouth daily.   Yes [provider]  ferrous sulfate  325 (65 FE) MG tablet Take 325 mg by mouth daily.   Yes [provider]  gabapentin (NEURONTIN) 300 MG capsule Take 300 mg by mouth 4 (four) times daily.   Yes [provider]  levETIRAcetam (KEPPRA) 750 MG tablet Take 750 mg by mouth 2 (two) times daily. 10/17/19  Yes [provider]  lisinopril  (PRINIVIL ,ZESTRIL ) 10 MG tablet Take 10 mg by mouth every morning.   Yes [provider]  metoprolol  succinate (TOPROL -XL) 25 MG 24 hr tablet Take 25 mg by mouth daily.   Yes [provider]  naproxen (NAPROSYN) 500 MG tablet Take 500 mg by mouth 2 (two) times daily with a meal.   Yes [provider]  PARoxetine (PAXIL) 40 MG tablet Take 40 mg by mouth at bedtime.    Yes [provider]  polyethylene glycol (MIRALAX  / GLYCOLAX ) packet Take 17 g by mouth daily.   Yes [provider]   Allergies  Allergen Reactions   A+D Diaper Rash [Diaper Rash Products]    Morphine Other (See Comments)    Other Reaction: Not Assessed   Procaine Other (See Comments)   Review of  Systems  Unable to perform ROS   Physical Exam Constitutional:      Comments: Rest with eyes closed.  Opens her mouth for food to be put in while keeping eyes closed.  Does not speak.  Pulmonary:     Effort: Pulmonary effort is normal.     Vital Signs: BP 123/83 (BP Location: Right Arm)   Pulse (!) 103   Temp 98.2 F (36.8 C)   Resp 20   Ht 5\' 6"  (1.676 m)   Wt 36.2 kg   SpO2 92%   BMI 12.88 kg/m  Pain Scale: PAINAD   Pain Score: 0-No pain   SpO2: SpO2: 92 % O2 Device:SpO2: 92 % O2 Flow Rate: .O2 Flow Rate (L/min): 3 L/min  IO: Intake/output summary:  Intake/Output Summary (Last 24 hours) at 08/31/2023 1242 Last data filed at 08/31/2023 0631 Gross per 24 hour  Intake 625.73 ml  Output 1750 ml  Net -1124.27 ml    LBM: Last BM Date :  (unknown) Baseline Weight: Weight: 43.1 kg Most recent weight: Weight: 36.2 kg       Signed by: Meribeth Standard, NP   Please contact Palliative Medicine Team phone at [phone] for questions and concerns.  For individual provider: See Tilford Foley

## 2023-08-31 NOTE — Progress Notes (Signed)
 Speech Language Pathology Treatment: Dysphagia  Patient Details Name: Tracey Wyatt MRN: [id number] DOB: May 05, 1956 Today's Date: 08/31/2023 Time: 1320-1400 SLP Time Calculation (min) (ACUTE ONLY): 40 min  Assessment / Plan / Recommendation Clinical Impression  Pt seen for follow up dysphagia intervention. Pt's brother present in the room for duration of session. Pt with continued persistent chorea-like oral/head movements, similar to presentation on Saturday. However, pt's lips/mouth were much improved (no scabbing/dried secretions) noted during oral care, and pt was able to open eyes and select preference in choice of 2 options (both improvements). Pt seen with trials of thin liquid via teaspoon/cup, cup sips leading to therapist pouring bolus into mouth with pt demonstrating minimal manipulation and eventual swallow trigger. Trials of straw sips completed to determine if pt had better oral control with pinched straw sips, however, pt demonstrated reduced labial strength to consistently pull from straw. Labial closure and A-P transfer/oral control best with trials of nectar thick liquids and puree solids via spoon. Delayed cough noted intermittently during trials- not overtly related to completion of swallow. Extended time allowed following coughing episodes.   Education shared regarding continued risk of aspiration with specific impact of pt's fluctuating alertness, total dependency for feeding, and limited oral control for bolus manipulation. Brother asking questions regarding pt's ability to nutritionally support self- therapist provided education regarding barriers to safe oral intake for meals- including low endurance, alertness, and extreme effort for minimal oral control in the setting of chorea like movements. Brother reported understanding across education.   Based on current presentation and hospital course, pt is at known high risk for aspiration. Recommend continued use of spoon  sips/bites of PO intake for most consistent/controlled administration with puree solids and nectar thick liquids. Monitor alertness and oral clearance with each bite. Closely monitor respiratory/pulmonary function. Elevated HOB during and after intake and maintain slow rate throughout. MD and RN aware of recommendations and aspiration risk. SLP will continue to follow pending decision for GOC.     HPI HPI: Pt is a 68 y/o with h/o HTN, tardive dyskinesia, vague history of seizure disorder, psychiatric disorders - depression, schizophrenia, pyromania. She is Cachectic w/ BMI of 12.88 per chart. She took Haldol  for many years and developed tardive diskinesia but has been of haldol  and risperidone  for >10 years. She has resided in a long term care facility for 20 years. . Over the past year she has slowly declined: losing weight, hair turned gray, she had behavior problems. For  two months she has had a progressive decline and over the past several weeks has become withdrawn and less communicative, decreased PO intake, worsening weight loss, and progressive decline in ability to manage ADLs and ambulate.   CT Chest Imaging: Lungs/Pleura: No suspicious pulmonary nodule or mass. Left base  atelectasis with small left pleural effusion.  Large hiatal hernia.      SLP Plan  Continue with current plan of care      Recommendations for follow up therapy are one component of a multi-disciplinary discharge planning process, led by the attending physician.  Recommendations may be updated based on patient status, additional functional criteria and insurance authorization.    Recommendations  Diet recommendations: Dysphagia 1 (puree);Nectar-thick liquid Liquids provided via: Teaspoon Medication Administration: Via alternative means Supervision: Staff to assist with self feeding;Full supervision/cueing for compensatory strategies Compensations: Minimize environmental distractions;Slow rate;Small  sips/bites Postural Changes and/or Swallow Maneuvers: Seated upright 90 degrees;Upright 30-60 min after meal  Oral care BID;Oral care before and after PO;Staff/trained caregiver to provide oral care   Frequent or constant Supervision/Assistance Dysphagia, oropharyngeal phase (R13.12)     Continue with current plan of care    [place] Julus Kelley Clapp, MS, CCC-SLP Speech Language Pathologist Rehab Services; [facility] [phone] (ascom)   [place] J Clapp  08/31/2023, 2:03 PM

## 2023-08-31 NOTE — Plan of Care (Signed)
 Patient transitioned to comfort care. Neurology consult cancelled by requesting provider.  Tracey Leaks, MD [facility] [phone]  If 7pm- 7am, please page neurology on call as listed in [facility].

## 2023-08-31 NOTE — Progress Notes (Addendum)
 Consultation Note Date: 08/31/2023   Patient Name: Tracey Wyatt  DOB: [birth year]  MRN: [id number]  Age / Sex: 68 y.o., female  PCP: Janis Melena, MD (Inactive) Referring Physician: Melodi Sprung, DO  Reason for Consultation: Establishing goals of care  HPI/Patient Profile: Ms. Balthrop, a 68 y/o with h/o HTN, tardive dyskinesia, vague history of seizure disorder, psychiatric disorders - depression, schizophrenia, pyromania. She took haldol  for many years and developed tardive diskinesia but has been of haldol  and risperidone  for >10 years. She has resided in a long term care facility for 20 years. . Over the past year she has slowly declined: losing weight, hair turned gray, she had behavior problems. For two months she has had a progressive decline and over the past several weeks has become withdrawn and less communicative, decreased PO intake, worsening weight loss, and progressive decline in ability to manage ADLs and ambulate. Her brother is the primary historian and has been involved with her care for years.   Clinical Assessment and Goals of Care: Notes and labs reviewed.  In to see patient.  Patient is currently resting in bed with brother at bedside.  She is extremely thin and frail.  Staff is currently trying to feed her and she is able to take a few bites of yogurt before she begins to cough.  Brother discusses that patient has had poor p.o. intake for the past 6 months to a year.  He discusses that she has had significant weight loss.   We discussed her diagnoses, prognosis, GOC, EOL wishes disposition and options.  Created space and opportunity for patient  to explore thoughts and feelings regarding current medical information.   A detailed discussion was had today regarding advanced directives.  Concepts specific to code status, artifical feeding and hydration, IV antibiotics and  rehospitalization were discussed.  The difference between an aggressive medical intervention path and a comfort care path was discussed.  Values and goals of care important to patient and family were attempted to be elicited.  Discussed limitations of medical interventions to prolong quality of life in some situations and discussed the concept of human mortality.  He discusses that they are a family of faith.  He discusses that he does not want his sister to suffer.  He discusses that he is weighing options on care moving forward.  Discussed multiple different scenarios.  He is aware that if the feeding tube is placed, she could easily remove it if she wants to.  We discussed that a feeding tube works to lengthen life, but does not help with quality of life.   ADDENDUM: In to bedside to speak with brothers Athena Bland and Rich Champ. HPOA forms in patient's chart showing Richard then Bambi Lever as HPOA.  We discussed her status and care moving forward. Family would like to shift to comfort care. Spoke with patient. She says "yes" that she understands the conversation. She said "yes" when asked if she wanted to be comfortable until death.   I completed a MOST form today with  brother Rich Champ after discussion with both he and Bambi Lever.  The signed original was placed in the chart. Each section of options on the form were reviewed in full detail and any questions were answered as needed. The form was scanned and sent to medical records for it to be uploaded under ACP tab in Epic. A photocopy was also placed in the chart to be scanned into EMR. The patient outlined their wishes for the following treatment decisions:  Cardiopulmonary Resuscitation: Do Not Attempt Resuscitation (DNR/No CPR)  Medical Interventions: Comfort Measures: Keep clean, warm, and dry. Use medication by any route, positioning, wound care, and other measures to relieve pain and suffering. Use oxygen, suction and manual treatment of airway obstruction  as needed for comfort. Do not transfer to the hospital unless comfort needs cannot be met in current location.  Antibiotics: No antibiotics (use other measures to relieve symptoms)  IV Fluids: No IV fluids (provide other measures to ensure comfort)  Feeding Tube: No feeding tube       SUMMARY OF RECOMMENDATIONS   PMT will follow Family would like hospice facility placement.   Prognosis:  Poor        Primary Diagnoses: Present on Admission:  Dementia (HCC)  Altered mental status   I have reviewed the medical record, interviewed the patient and family, and examined the patient. The following aspects are pertinent.  Past Medical History:  Diagnosis Date   Arthritis    Depression    GERD (gastroesophageal reflux disease)    Heart murmur    History of hiatal hernia    Hypertension    Pyromania    Seizures (HCC)    LAST ONE IN 2002 APPROXIMATELY   Substance abuse (HCC)    Social History   Socioeconomic History   Marital status: Single    Spouse name: Not on file   Number of children: Not on file   Years of education: Not on file   Highest education level: Not on file  Occupational History   Not on file  Tobacco Use   Smoking status: Former    Current packs/day: 0.00    Average packs/day: 0.5 packs/day for 10.0 years (5.0 ttl pk-yrs)    Types: Cigarettes    Start date: 03/31/1986    Quit date: 03/31/1996    Years since quitting: 27.4   Smokeless tobacco: Never  Vaping Use   Vaping status: Never Used  Substance and Sexual Activity   Alcohol  use: No   Drug use: Not Currently   Sexual activity: Not Currently    Birth control/protection: Post-menopausal  Other Topics Concern   Not on file  Social History Narrative   Not on file   Social Drivers of Health   Financial Resource Strain: Not on file  Food Insecurity: No Food Insecurity (08/26/2023)   Hunger Vital Sign    Worried About Running Out of Food in the Last Year: Never true    Ran Out of Food in the  Last Year: Never true  Transportation Needs: No Transportation Needs (08/26/2023)   PRAPARE - Administrator, Civil Service (Medical): No    Lack of Transportation (Non-Medical): No  Physical Activity: Not on file  Stress: Not on file  Social Connections: Unknown (08/26/2023)   Social Connection and Isolation Panel [NHANES]    Frequency of Communication with Friends and Family: Patient unable to answer    Frequency of Social Gatherings with Friends and Family: Three times a week  Attends Religious Services: Patient unable to answer    Active Member of Clubs or Organizations: Patient unable to answer    Attends Club or Organization Meetings: Never    Marital Status: Divorced   History reviewed. No pertinent family history. Scheduled Meds:  aspirin   325 mg Oral Daily   benztropine   1 mg Oral TID   enoxaparin  (LOVENOX ) injection  30 mg Subcutaneous Q24H   ferrous sulfate   15 mg of iron Oral Daily   OLANZapine   2.5 mg Intramuscular BID   pantoprazole  (PROTONIX ) IV  40 mg Intravenous QHS   polyethylene glycol  17 g Oral Daily   thiamine  (VITAMIN B1) injection  100 mg Intravenous Daily   Continuous Infusions:   ceFAZolin  (ANCEF ) IV 2 g (08/31/23 0511)   potassium chloride  10 mEq (08/31/23 1008)   valproate sodium  120 mg (08/31/23 1138)   PRN Meds:.hydrALAZINE , LORazepam  Medications Prior to Admission:  Prior to Admission medications   Medication Sig Start Date End Date Taking? Authorizing Provider  Acetaminophen  Extra Strength 500 MG TABS Take 1,000 mg by mouth in the morning and at bedtime.   Yes [provider]  alendronate (FOSAMAX) 70 MG tablet Take 70 mg by mouth once a week. Take with a full glass of water on an empty stomach EVERY WEDNESDAY   Yes [provider]  aspirin  325 MG tablet Take 325 mg by mouth daily.   Yes [provider]  benztropine  (COGENTIN ) 2 MG tablet Take 2 mg by mouth 3 (three) times daily. 07/20/23  Yes [provider]  busPIRone (BUSPAR) 7.5 MG tablet Take 7.5 mg by mouth 2 (two) times daily. 08/17/23  Yes [provider]  Cholecalciferol (VITAMIN D3) 50 MCG (2000 UT) TABS Take 50 mcg by mouth daily.   Yes [provider]  divalproex  (DEPAKOTE ) 250 MG DR tablet Take 250 mg by mouth daily.   Yes [provider]  esomeprazole (NEXIUM) 40 MG capsule Take 40 mg by mouth daily.   Yes [provider]  ferrous sulfate  325 (65 FE) MG tablet Take 325 mg by mouth daily.   Yes [provider]  gabapentin (NEURONTIN) 300 MG capsule Take 300 mg by mouth 4 (four) times daily.   Yes [provider]  levETIRAcetam (KEPPRA) 750 MG tablet Take 750 mg by mouth 2 (two) times daily. 10/17/19  Yes [provider]  lisinopril  (PRINIVIL ,ZESTRIL ) 10 MG tablet Take 10 mg by mouth every morning.   Yes [provider]  metoprolol  succinate (TOPROL -XL) 25 MG 24 hr tablet Take 25 mg by mouth daily.   Yes [provider]  naproxen (NAPROSYN) 500 MG tablet Take 500 mg by mouth 2 (two) times daily with a meal.   Yes [provider]  PARoxetine (PAXIL) 40 MG tablet Take 40 mg by mouth at bedtime.    Yes [provider]  polyethylene glycol (MIRALAX  / GLYCOLAX ) packet Take 17 g by mouth daily.   Yes [provider]   Allergies  Allergen Reactions   A+D Diaper Rash [Diaper Rash Products]    Morphine Other (See Comments)    Other Reaction: Not Assessed   Procaine Other (See Comments)   Review of Systems  Unable to perform ROS   Physical Exam Constitutional:      Comments: Rest with eyes closed.  Opens her mouth for food to be put in while keeping eyes closed.  Does not speak.   Awake on return to bedside.  Pulmonary:     Effort: Pulmonary effort is normal.     Vital Signs: BP 123/83 (BP Location: Right Arm)   Pulse (!) 103   Temp 98.2 F (36.8 C)   Resp 20   Ht 5\' 6"  (1.676 m)   Wt 36.2 kg   SpO2 92%    BMI 12.88 kg/m  Pain Scale: PAINAD   Pain Score: 0-No pain   SpO2: SpO2: 92 % O2 Device:SpO2: 92 % O2 Flow Rate: .O2 Flow Rate (L/min): 3 L/min  IO: Intake/output summary:  Intake/Output Summary (Last 24 hours) at 08/31/2023 1242 Last data filed at 08/31/2023 0631 Gross per 24 hour  Intake 625.73 ml  Output 1750 ml  Net -1124.27 ml    LBM:  Baseline Weight: Weight: 43.1 kg Most recent weight: Weight: 36.2 kg       Signed by: Meribeth Standard, NP   Please contact Palliative Medicine Team phone at [phone] for questions and concerns.  For individual provider: See Tilford Foley

## 2023-08-31 NOTE — Progress Notes (Signed)
 Nutrition Brief Note  Chart reviewed. Pt now transitioning to comfort care. Plan for residential hospice.  No further nutrition interventions planned at this time.  Please re-consult as needed.   Herschel Lords, RD, LDN, CDCES Registered Dietitian III Certified Diabetes Care and Education Specialist If unable to reach this RD, please use "RD Inpatient" group chat on secure chat between hours of 8am-4 pm daily

## 2023-08-31 NOTE — Progress Notes (Signed)
 [facility] LIAISON NOTE   Received request from Duke Gibbons, Transitions of Care Manager, for evaluation for Hospice InPatient Unit [facility]) Per Dr. Tessie Fila, patient has not been approved for IPU.  Spoke with Ulyses Gandy, son, to initiate education related to hospice philosophy, services, and team approach to care.  Informed him patient was not approved for IPU, but we could serve patient at LTC or in private home.  He said [facility] said patient couldn't return.  He understands a bed search maybe started for placement if that is what the family decides.  Will continue to follow through final disposition.    [facility] contact numbers given to Richard.  Above information shared with Duke Gibbons, Transitions of Care Manager and hospital medical care team.   Please call with any hospice related questions or concerns. Thank you for the opportunity to participate in this patient's care.   Ambrosio Junker, MA, BSN, RN, FNE Nurse Liaison [phone]

## 2023-08-31 NOTE — Progress Notes (Signed)
 PROGRESS NOTE    Tracey Wyatt   XLK:[id number] DOB: 1956/02/07  DOA: 08/25/2023 Date of Service: 08/31/23 which is hospital day 5  PCP: Janis Melena, MD (Inactive)    Hospital course / significant events:   HPI: Tracey Wyatt, a 68 y/o with h/o HTN, tardive dyskinesia, vague history of seizure disorder, psychiatric disorders - depression, schizophrenia, pyromania. She took haldol  for many years and developed tardive diskinesia but has been of haldol  and risperidone  for >10 years. She has resided in a long term care facility for 20 years. . Over the past year she has slowly declined: losing weight, hair turned gray, she had behavior problems. For  two months she has had a progressive decline and over the past several weeks has become withdrawn and less communicative, decreased PO intake, worsening weight loss, and progressive decline in ability to manage ADLs and ambulate. Her brother is the primary historian and has been involved with her care for years.   Detailed neurology consult note 02/07 for other hisory:   "While she was treated with Haldol  and risperidone  for many years they reports she has been off of any of those medications for at least 1 or 2 decades.  Unfortunately she has had progressive decline in her cognitive abilities as well as weight loss for the past year despite eating well   Due to her weight loss they were encouraging her to eat more and noted that she was still eating well 3 weeks ago although she was more slouched over fatigued appearing.  Now for the past 3 weeks she has hardly been able to eat and not walking well   Additionally they note that 6 - 8 weeks ago she was noted to be more aggressive with the staff hitting etc. and thinks some medication changes may have been made at the time, but they are unclear on details"  She was seen in [facility] 08/24/23 for these problems. Her workup including labs, CT brain, U/A were unrevealing. She was returned to her  long term care facility. She now returns to [facility] with worsening symptoms, essentially non-communicative.    02/04: to ED 02/05: admitted to hospitalist service, unable to get EEG until tomorrow. Nonverbal, somnolent.  02/06: More awake today but disoriented, slow/difficult speech. SLP, PT, OT to see. EEG technically difficult study suggestive of mild diffuse encephalopathy. Neuro consult ordered. Qualifies for SNF rehab but concern for po intake 02/07: awake and interactive today, not eating much. Neuro recs as below - w/u movement d/o for reversible causes, reviewing meds for possible reversible causes. See A/P for full discussion - if feeding tube would provide nutrition while treatment takes time to help her chorea, then this would be reasonable but if the condition is expected to stay stable or deteriorate family would not want feeding tube and will consider comfort measures 02/08: more somnolent today and not eating. Palliative to see - see consult note. WBC up, infectious w/u and abx.  02/09: more alert this morning, says she's hungry, making jokes. NG attempted but pt did not tolerate bedside placement. Continue abx  02/10: see IPAL   Consultants:  Neurology  Palliative care Infectious disease Interventional Radiology General surgery   Procedures/Surgeries: none      ASSESSMENT & PLAN:   Change in mental status /  Metabolic Encephalopathy  Progressive movement disorder working dx is tardive dyskinesia  Questionable seizure history  Ddx would also include advancement of underlying dementia, psychiatric disorder Very much appreciate neurology evaluation  Investigate  reversible causes:  PENDING: B1, B3, B6, MMA,UDS add on if possible (ordered) PENDING: Other treatable causes of chorea screening: ANA w/ reflex, SPEP and IFE with 4 times daily, 24-hour urine UPEP/U IFE/light chains/total protein, serum copper , 24-hour urine copper   NORMAL/NR: TSH, RPR, HIV, ammonia, CK, ESR,  B12, VItD, folate ABNORMAL:  CRP 13  Investigate for seizure EEG was no concerns Imaging  Currently unable to obtain MRI due to restlessness, likely will be unable to obtain anesthetized MRI at [facility] but would consider obtaining this if she undergoes anesthesia for any other reason / if we are able to get movement under control  CT chest/abdomen/pelvis to evaluate for occult malignancy which might also explain weight loss --> no apparent malignancy Consider thyroid ultrasound previously recommended in June if thyroid nodule is still present  Medication additions / changes - managed by neurology, note pt swallowing pills has been a challenge  Olanzapine  2.5 mg IM twice daily Continue benztropine  1 mg 3 times daily - this medication can at times worsen movement disorders and is associated with cognitive impairment so was reduced from home dose  Continue Depacon  120mg  every 8 hours 100 mg IV thiamine  daily pending level, may be discontinued if level results normal  Consider restarting risperidone  to see if this improves movements Stopped keppra in case this is contributing to mood issues/agitation describedenough for her to tolerate p.o. intake depending on her clinical course  Anticipate outpatient follow up neurology / neuropsych / movement disorder clinic pending clinical course    SIRS/Sepsis Bacteremia - (+)BCx Staph Aureus  Await BCx ID/Susc  ID following  Consideration for aspiration pelvic cyst Consideration for TEE   Protein calorie malnutrition  Weight loss Long term calorie deprivation.  concerned that her weight loss may be primarily secondary to the severity of her movements (caloric needs, inability to functionally coordinate eating) causing inability to swallow, other possibility is dementia or other progressive cause Poor reserves confer very low chance of recovery at this point  Have reasonably ruled out malignancy  See IPAL note See SLP eval See Palliative  note Appreciate IR and general surgery input - would hold off on NG in favor of G-tube *if* decision is made to pursue artifical feeding, G-tube could be placed by general surgery   Cystic pelvic mass 3.8 x 5.4 cm, favored benign and noninfectious per radiology s/p pelvic US  Possible compressive effect on rectum Stool softeners as needed May be worth aspirating to rule out abscess   HTN (hypertension) BP well controlled. At this point she is not able to take po medications Hydralazine  2 mg q6 for SBP > 160   Hiatal Hernia, large PPI Precludes NG placement w/ IR, IR recs for surgery to place gastric tube if this is a consideration   Hypokalemia Replace as needed Monitor BMP  Advanced care planning Pt disoriented, does not have capacity to make decisions  Palliative care to see to discuss plan for best meeting goals  SEE IPAL RE: FEEDING TUBE DISCUSSION   underweight based on BMI: Body mass index is 15.33 kg/m.  Underweight - under 18  overweight - 25 to 29 obese - 30 or more Class 1 obesity: BMI of 30.0 to 34 Class 2 obesity: BMI of 35.0 to 39 Class 3 obesity: BMI of 40.0 to 49 Super Morbid Obesity: BMI 50-59 Super-super Morbid Obesity: BMI 60+ Significantly low or high BMI is associated with higher medical risk.  Weight management advised as adjunct to other disease management and  risk reduction treatments    DVT prophylaxis: lovenox   IV fluids: D5 continuous IV fluids  Nutrition: NPO pend SLP Central lines / invasive devices: none  Code Status: DNR confirmed w/ brother  ACP documentation reviewed:  none on file in [facility]  TOC needs: TBD Barriers to dispo / significant pending items: coplicated clinical above                Subjective: Pt unable to contribute much  Brother at bedside    Family Communication: brother at bedside, planning on family meeting today     Objective Findings:  Vitals:   08/30/23 1546 08/30/23 1951 08/31/23 0436  08/31/23 0805  BP: 125/75 122/73 (!) 130/91 123/83  Pulse: 75 87  (!) 103  Resp: 17 18 16 20   Temp:  98.1 F (36.7 C) (!) 97.1 F (36.2 C) 98.2 F (36.8 C)  TempSrc:      SpO2: 95% 100% 96% 92%  Weight:      Height:        Intake/Output Summary (Last 24 hours) at 08/31/2023 1301 Last data filed at 08/31/2023 0631 Gross per 24 hour  Intake 625.73 ml  Output 1750 ml  Net -1124.27 ml    Filed Weights   08/26/23 0030 08/26/23 2147  Weight: 43.1 kg 36.2 kg    Examination:  Physical Exam Constitutional:      General: She is not in acute distress.    Appearance: She is ill-appearing. She is not toxic-appearing.     Comments: frail  Cardiovascular:     Rate and Rhythm: Normal rate and regular rhythm.  Pulmonary:     Effort: Pulmonary effort is normal.     Breath sounds: Normal breath sounds.  Abdominal:     General: Abdomen is flat.     Palpations: Abdomen is soft.  Musculoskeletal:     Right lower leg: No edema.     Left lower leg: No edema.  Skin:    General: Skin is dry.  Neurological:     Mental Status: She is alert. She is disoriented.     Motor: Weakness present.  Psychiatric:        Behavior: Behavior normal.          Scheduled Medications:   aspirin   325 mg Oral Daily   benztropine   1 mg Oral TID   enoxaparin  (LOVENOX ) injection  30 mg Subcutaneous Q24H   ferrous sulfate   15 mg of iron Oral Daily   OLANZapine   2.5 mg Intramuscular BID   pantoprazole  (PROTONIX ) IV  40 mg Intravenous QHS   polyethylene glycol  17 g Oral Daily   thiamine  (VITAMIN B1) injection  100 mg Intravenous Daily    Continuous Infusions:   ceFAZolin  (ANCEF ) IV 2 g (08/31/23 0511)   potassium chloride  10 mEq (08/31/23 1008)   valproate sodium  120 mg (08/31/23 1138)    PRN Medications:  hydrALAZINE , LORazepam   Antimicrobials from admission:  Anti-infectives (From admission, onward)    Start     Dose/Rate Route Frequency Ordered Stop   08/30/23 1900  vancomycin   (VANCOREADY) IVPB 500 mg/100 mL  Status:  Discontinued        500 mg 100 mL/hr over 60 Minutes Intravenous Every 24 hours 08/29/23 1839 08/30/23 0437   08/30/23 1400  ceFAZolin  (ANCEF ) IVPB 2g/100 mL premix        2 g 200 mL/hr over 30 Minutes Intravenous Every 8 hours 08/30/23 0825     08/30/23 0600  ceFAZolin  (ANCEF )  IVPB 1 g/50 mL premix  Status:  Discontinued        1 g 100 mL/hr over 30 Minutes Intravenous Every 8 hours 08/30/23 0436 08/30/23 0825   08/29/23 1830  cefTRIAXone  (ROCEPHIN ) 1 g in sodium chloride  0.9 % 100 mL IVPB  Status:  Discontinued        1 g 200 mL/hr over 30 Minutes Intravenous Every 24 hours 08/29/23 1727 08/30/23 0436   08/29/23 1830  vancomycin  (VANCOCIN ) 750 mg in sodium chloride  0.9 % 250 mL IVPB        750 mg 250 mL/hr over 60 Minutes Intravenous Once 08/29/23 1734 08/29/23 2221           Data Reviewed:  I have personally reviewed the following...  CBC: Recent Labs  Lab 08/24/23 1708 08/25/23 2046 08/29/23 1143 08/30/23 0917 08/31/23 0623  WBC 8.7 5.5 23.0* 27.3* 18.4*  NEUTROABS 6.0 3.6 21.2*  --   --   HGB 9.5* 10.6* 10.3* 10.0* 10.6*  HCT 27.9* 31.5* 29.6* 28.7* 29.1*  MCV 88.9 90.0 87.3 86.2 83.4  PLT 189 204 273 247 265   Basic Metabolic Panel: Recent Labs  Lab 08/24/23 1708 08/25/23 1924 08/29/23 1749 08/30/23 0909 08/30/23 0917 08/31/23 0623  NA 137 141  --   --  131* 133*  K 3.8 4.0  --   --  2.5* 2.5*  CL 104 106  --   --  97* 100  CO2 22 26  --   --  22 24  GLUCOSE 89 95  --   --  99 86  BUN 41* 39*  --   --  9 7*  CREATININE 0.63 0.50 0.33*  --  <0.30* 0.32*  CALCIUM 8.6* 8.3*  --   --  6.9* 7.4*  MG  --   --   --  1.5*  --  2.0  PHOS  --   --   --   --   --  1.4*   GFR: Estimated Creatinine Clearance: 38.5 mL/min (A) (by C-G formula based on SCr of 0.32 mg/dL (L)). Liver Function Tests: Recent Labs  Lab 08/25/23 1924 08/30/23 0917  AST 16 20  ALT 14 14  ALKPHOS 36* 67  BILITOT 0.8 0.5  PROT 5.4* 4.7*   ALBUMIN 2.7* 2.0*   No results for input(s): "LIPASE", "AMYLASE" in the last 168 hours. Recent Labs  Lab 08/27/23 1420  AMMONIA 25   Coagulation Profile: No results for input(s): "INR", "PROTIME" in the last 168 hours. Cardiac Enzymes: Recent Labs  Lab 08/28/23 1022  CKTOTAL 169   BNP (last 3 results) No results for input(s): "PROBNP" in the last 8760 hours. HbA1C: No results for input(s): "HGBA1C" in the last 72 hours. CBG: Recent Labs  Lab 08/28/23 2009 08/29/23 0803 08/29/23 1113 08/29/23 1557 08/29/23 2012  GLUCAP 109* 109* 105* 91 93   Lipid Profile: No results for input(s): "CHOL", "HDL", "LDLCALC", "TRIG", "CHOLHDL", "LDLDIRECT" in the last 72 hours. Thyroid Function Tests: No results for input(s): "TSH", "T4TOTAL", "FREET4", "T3FREE", "THYROIDAB" in the last 72 hours.  Anemia Panel: No results for input(s): "VITAMINB12", "FOLATE", "FERRITIN", "TIBC", "IRON", "RETICCTPCT" in the last 72 hours.  Most Recent Urinalysis On File:     Component Value Date/Time   COLORURINE AMBER (A) 08/30/2023 0859   APPEARANCEUR CLOUDY (A) 08/30/2023 0859   LABSPEC 1.021 08/30/2023 0859   PHURINE 7.0 08/30/2023 0859   GLUCOSEU NEGATIVE 08/30/2023 0859   HGBUR NEGATIVE 08/30/2023 0859  BILIRUBINUR NEGATIVE 08/30/2023 0859   KETONESUR NEGATIVE 08/30/2023 0859   PROTEINUR 100 (A) 08/30/2023 0859   NITRITE NEGATIVE 08/30/2023 0859   LEUKOCYTESUR NEGATIVE 08/30/2023 0859   Sepsis Labs: @LABRCNTIP (procalcitonin:4,lacticidven:4) Microbiology: Recent Results (from the past 240 hours)  Resp panel by RT-PCR (RSV, Flu A&B, Covid) Urine, Catheterized     Status: None   Collection Time: 08/24/23 10:36 PM   Specimen: Urine, Catheterized; Nasal Swab  Result Value Ref Range Status   SARS Coronavirus 2 by RT PCR NEGATIVE NEGATIVE Final    Comment: (NOTE) SARS-CoV-2 target nucleic acids are NOT DETECTED.  The SARS-CoV-2 RNA is generally detectable in upper respiratory specimens  during the acute phase of infection. The lowest concentration of SARS-CoV-2 viral copies this assay can detect is 138 copies/mL. A negative result does not preclude SARS-Cov-2 infection and should not be used as the sole basis for treatment or other patient management decisions. A negative result may occur with  improper specimen collection/handling, submission of specimen other than nasopharyngeal swab, presence of viral mutation(s) within the areas targeted by this assay, and inadequate number of viral copies(<138 copies/mL). A negative result must be combined with clinical observations, patient history, and epidemiological information. The expected result is Negative.  Fact Sheet for Patients:  BloggerCourse.com  Fact Sheet for Healthcare Providers:  SeriousBroker.it  This test is no t yet approved or cleared by the [place]  FDA and  has been authorized for detection and/or diagnosis of SARS-CoV-2 by FDA under an Emergency Use Authorization (EUA). This EUA will remain  in effect (meaning this test can be used) for the duration of the COVID-19 declaration under Section 564(b)(1) of the Act, 21 U.S.C.section 360bbb-3(b)(1), unless the authorization is terminated  or revoked sooner.       Influenza A by PCR NEGATIVE NEGATIVE Final   Influenza B by PCR NEGATIVE NEGATIVE Final    Comment: (NOTE) The Xpert Xpress SARS-CoV-2/FLU/RSV plus assay is intended as an aid in the diagnosis of influenza from Nasopharyngeal swab specimens and should not be used as a sole basis for treatment. Nasal washings and aspirates are unacceptable for Xpert Xpress SARS-CoV-2/FLU/RSV testing.  Fact Sheet for Patients: BloggerCourse.com  Fact Sheet for Healthcare Providers: SeriousBroker.it  This test is not yet approved or cleared by the [place]  FDA and has been authorized for detection  and/or diagnosis of SARS-CoV-2 by FDA under an Emergency Use Authorization (EUA). This EUA will remain in effect (meaning this test can be used) for the duration of the COVID-19 declaration under Section 564(b)(1) of the Act, 21 U.S.C. section 360bbb-3(b)(1), unless the authorization is terminated or revoked.     Resp Syncytial Virus by PCR NEGATIVE NEGATIVE Final    Comment: (NOTE) Fact Sheet for Patients: BloggerCourse.com  Fact Sheet for Healthcare Providers: SeriousBroker.it  This test is not yet approved or cleared by the [place]  FDA and has been authorized for detection and/or diagnosis of SARS-CoV-2 by FDA under an Emergency Use Authorization (EUA). This EUA will remain in effect (meaning this test can be used) for the duration of the COVID-19 declaration under Section 564(b)(1) of the Act, 21 U.S.C. section 360bbb-3(b)(1), unless the authorization is terminated or revoked.  Performed at [facility], [address] [place], [place] [address]   MRSA Next Gen by PCR, Nasal     Status: None   Collection Time: 08/27/23  6:00 AM   Specimen: Nasal Mucosa; Nasal Swab  Result Value Ref Range Status   MRSA by PCR  Next Gen NOT DETECTED NOT DETECTED Final    Comment: (NOTE) The GeneXpert MRSA Assay (FDA approved for NASAL specimens only), is one component of a comprehensive MRSA colonization surveillance program. It is not intended to diagnose MRSA infection nor to guide or monitor treatment for MRSA infections. Test performance is not FDA approved in patients less than 8 years old. Performed at [facility], [address] [place], [place] [address]   Culture, blood (Routine X 2) w Reflex to ID Panel     Status: Abnormal (Preliminary result)   Collection Time: 08/29/23  3:09 PM   Specimen: BLOOD  Result Value Ref Range Status   Specimen Description   Final    BLOOD RAC Performed at [facility], [address] [place], [place] [address]    Special Requests   Final    BOTTLES DRAWN AEROBIC AND ANAEROBIC Blood Culture adequate volume Performed at [facility], [address] [place], [place] [address]    Culture  Setup Time   Final    GRAM POSITIVE COCCI IN BOTH AEROBIC AND ANAEROBIC BOTTLES Organism ID to follow CRITICAL RESULT CALLED TO, READ BACK BY AND VERIFIED WITH: NATHAN BELUE @ 0124 08/30/23 [facility] Performed at [facility], [address] [place], [place] [address]    Culture STAPHYLOCOCCUS AUREUS (A)  Final   Report Status PENDING  Incomplete  Culture, blood (Routine X 2) w Reflex to ID Panel     Status: Abnormal (Preliminary result)   Collection Time: 08/29/23  3:09 PM   Specimen: BLOOD  Result Value Ref Range Status   Specimen Description   Final    BLOOD LAC Performed at [facility], [address] [place], [place] [address]    Special Requests   Final    BOTTLES DRAWN AEROBIC AND ANAEROBIC Blood Culture adequate volume Performed at [facility], [address] [place], [place] [address]    Culture  Setup Time   Final    GRAM POSITIVE COCCI IN BOTH AEROBIC AND ANAEROBIC BOTTLES CRITICAL RESULT CALLED TO, READ BACK BY AND VERIFIED WITHNoe Bath @ 5784 08/30/23 [facility] Performed at [facility], [address] [place], [place] [address]    Culture STAPHYLOCOCCUS AUREUS (A)  Final   Report Status PENDING  Incomplete  Blood Culture ID Panel (Reflexed)     Status: Abnormal   Collection Time: 08/29/23  3:09 PM  Result Value Ref Range Status   Enterococcus faecalis NOT DETECTED NOT DETECTED Final   Enterococcus Faecium NOT DETECTED NOT DETECTED Final   Listeria monocytogenes NOT DETECTED NOT DETECTED Final   Staphylococcus species DETECTED (A) NOT DETECTED Final    Comment: CRITICAL RESULT CALLED TO, READ BACK BY AND VERIFIED WITH: NATHAN BELUE @ 0124 08/30/23 [facility]    Staphylococcus aureus (BCID)  DETECTED (A) NOT DETECTED Final    Comment: CRITICAL RESULT CALLED TO, READ BACK BY AND VERIFIED WITH: NATHAN BELUE @ 0124 08/30/23 [facility]    Staphylococcus epidermidis NOT DETECTED NOT DETECTED Final   Staphylococcus lugdunensis NOT DETECTED NOT DETECTED Final   Streptococcus species NOT DETECTED NOT DETECTED Final   Streptococcus agalactiae NOT DETECTED NOT DETECTED Final   Streptococcus pneumoniae NOT DETECTED NOT DETECTED Final   Streptococcus pyogenes NOT DETECTED NOT DETECTED Final   A.calcoaceticus-baumannii NOT DETECTED NOT DETECTED Final   Bacteroides fragilis NOT DETECTED NOT DETECTED Final   Enterobacterales NOT DETECTED NOT DETECTED Final   Enterobacter cloacae complex NOT DETECTED NOT  DETECTED Final   Escherichia coli NOT DETECTED NOT DETECTED Final   Klebsiella aerogenes NOT DETECTED NOT DETECTED Final   Klebsiella oxytoca NOT DETECTED NOT DETECTED Final   Klebsiella pneumoniae NOT DETECTED NOT DETECTED Final   Proteus species NOT DETECTED NOT DETECTED Final   Salmonella species NOT DETECTED NOT DETECTED Final   Serratia marcescens NOT DETECTED NOT DETECTED Final   Haemophilus influenzae NOT DETECTED NOT DETECTED Final   Neisseria meningitidis NOT DETECTED NOT DETECTED Final   Pseudomonas aeruginosa NOT DETECTED NOT DETECTED Final   Stenotrophomonas maltophilia NOT DETECTED NOT DETECTED Final   Candida albicans NOT DETECTED NOT DETECTED Final   Candida auris NOT DETECTED NOT DETECTED Final   Candida glabrata NOT DETECTED NOT DETECTED Final   Candida krusei NOT DETECTED NOT DETECTED Final   Candida parapsilosis NOT DETECTED NOT DETECTED Final   Candida tropicalis NOT DETECTED NOT DETECTED Final   Cryptococcus neoformans/gattii NOT DETECTED NOT DETECTED Final   Meth resistant mecA/C and MREJ NOT DETECTED NOT DETECTED Final    Comment: Performed at [facility], [address], [place], [place] [address]  Urine Culture (for pregnant, neutropenic or urologic patients  or patients with an indwelling urinary catheter)     Status: Abnormal (Preliminary result)   Collection Time: 08/30/23  8:59 AM   Specimen: In/Out Cath Urine  Result Value Ref Range Status   Specimen Description   Final    IN/OUT CATH URINE Performed at [facility], [address], [place], [place] [address]    Special Requests   Final    NONE Performed at [facility], [address] [place], [place] [address]    Culture (A)  Final    >=100,000 COLONIES/mL ENTEROCOCCUS FAECALIS >=100,000 COLONIES/mL STAPHYLOCOCCUS SIMULANS SUSCEPTIBILITIES TO FOLLOW Performed at [facility], [address] [place], [place] [address]    Report Status PENDING  Incomplete      Radiology Studies last 3 days: US  PELVIS (TRANSABDOMINAL ONLY) Result Date: 08/31/2023 CLINICAL DATA:  68 year old female with a cystic lesion in the posterior left hemipelvis on CT 3 days ago. Further characterization. EXAM: TRANSABDOMINAL ULTRASOUND OF PELVIS TECHNIQUE: Transabdominal ultrasound examination of the pelvis was performed including evaluation of the uterus, ovaries, adnexal regions, and pelvic cul-de-sac. COMPARISON:  CT Abdomen and Pelvis 08/28/2023. FINDINGS: Uterus Measurements: 5.0 x 1.6 x 1.9 cm = volume: 8 mL. No fibroids or other mass visualized. Endometrium Thickness: 3 mm.  No focal abnormality visualized. Right ovary No normal right ovarian parenchyma can be identified. Left ovary No normal left ovarian parenchyma can be identified. Other findings: Foley catheter decompressing the urinary bladder now (images 22, 38). Oval mildly irregularly shaped fluid collection in the left hemipelvis corresponding to the CT finding is 61 x 33 x 52 mm. Estimated volume 52 mL. This is in the region of the left adnexa. Perhaps minimal internal septation (image 40). But there are no vascular elements on color Doppler (image 28). And otherwise simple fluid density. No superimposed layering free fluid  identified in the pelvis. IMPRESSION: 1. Left hemipelvis simple fluid density collection with no vascular elements measuring up to 6.1 cm, estimated volume 52 mL. This is in the vicinity of the left adnexa, although neither ovary can be identified. 2. Diminutive, normal uterus. Electronically Signed   By: Marlise Simpers M.D.   On: 08/31/2023 10:56   US  EKG SITE RITE Result Date: 08/30/2023 If Site Rite image not attached, placement could not be confirmed due  to current cardiac rhythm.  DG Chest Port 1 View Result Date: 08/29/2023 CLINICAL DATA:  Sirs EXAM: PORTABLE CHEST 1 VIEW COMPARISON:  08/24/2023 FINDINGS: Heart and mediastinal contours within normal limits. Left lower lobe retrocardiac opacity. Right lung clear. No visual effusions or acute bony abnormality. IMPRESSION: Left lower lobe retrocardiac atelectasis or infiltrate. Electronically Signed   By: Janeece Mechanic M.D.   On: 08/29/2023 17:09   CT CHEST ABDOMEN PELVIS W CONTRAST Result Date: 08/28/2023 CLINICAL DATA:  Weakness. Weight loss. Occult malignancy. * Tracking Code: BO * EXAM: CT CHEST, ABDOMEN, AND PELVIS WITH CONTRAST TECHNIQUE: Multidetector CT imaging of the chest, abdomen and pelvis was performed following the standard protocol during bolus administration of intravenous contrast. RADIATION DOSE REDUCTION: This exam was performed according to the departmental dose-optimization program which includes automated exposure control, adjustment of the mA and/or kV according to patient size and/or use of iterative reconstruction technique. CONTRAST:  OMNIPAQUE  IOHEXOL  300 MG/ML  SOLN COMPARISON:  None Available. FINDINGS: CT CHEST FINDINGS Cardiovascular: The heart size is normal. No substantial pericardial effusion. Mild atherosclerotic calcification is noted in the wall of the thoracic aorta. Aberrant origin right subclavian artery noted. Mediastinum/Nodes: No mediastinal lymphadenopathy. There is no hilar lymphadenopathy. Large hiatal hernia. The  esophagus has normal imaging features. There is no axillary lymphadenopathy. Lungs/Pleura: No suspicious pulmonary nodule or mass. Left base atelectasis with small left pleural effusion. Musculoskeletal: No worrisome lytic or sclerotic osseous abnormality. Chronic superior endplate compression deformity noted at T12. CT ABDOMEN PELVIS FINDINGS Hepatobiliary: No suspicious focal abnormality within the liver parenchyma. History of cholecystectomy. No intrahepatic or extrahepatic biliary dilation. Common bile duct measures 4 mm diameter in the pancreatic head. Pancreas: Diffuse parenchymal atrophy without main duct dilatation. Spleen: 2.7 cm homogeneous water density lesion in the posterior spleen has benign appearance, suggesting cyst or pseudocyst. Adrenals/Urinary Tract: 2 cm right adrenal nodule has a relative washout of 58%, most suggestive of benign adenoma. No left adrenal nodule or mass. Kidneys unremarkable. No evidence for hydroureter. Bladder is distended. Stomach/Bowel: Large hiatal hernia. Distal stomach and duodenum are nondilated No small bowel wall thickening. No small bowel dilatation. The terminal ileum is not discretely visualized. The appendix is not well visualized, but there is no edema or inflammation in the region of the cecal tip to suggest appendicitis. Colon is largely decompressed although there is some mild gaseous distension in the hepatic flexure. Prominent rectal stool volume. Vascular/Lymphatic: There is mild atherosclerotic calcification of the abdominal aorta without aneurysm. There is no gastrohepatic or hepatoduodenal ligament lymphadenopathy. No retroperitoneal or mesenteric lymphadenopathy. No pelvic sidewall lymphadenopathy. Reproductive: Uterus not well visualized. No evidence for right adnexal mass. 5.4 x 3.8 cm unilocular cystic lesion identified posterior left pelvis. This appears to generate mass-effect on the rectum (see coronal 57/5) suggesting it represents a loculated  fluid collection or cyst rather than free fluid in the pelvis. Other: There is diffuse mesenteric and body wall edema. Musculoskeletal: Small gas bubble is identified in the subcutaneous fat of the low anterior midline abdominal wall, presumably an injection site. Markedly heterogeneous mineralization of the sacrum. This is probably related to bilateral insufficiency fractures. Imaging appearance is not entirely characteristic for Paget's disease. Given lack of sclerotic lesions elsewhere, metastatic involvement is considered less likely but not entirely excluded. IMPRESSION: 1. No definite evidence for malignancy in the chest, abdomen, or pelvis. 2. Markedly heterogeneous mineralization of the sacrum. This is probably related to bilateral insufficiency fractures. Imaging appearance is not entirely characteristic  for Paget's disease. Given lack of sclerotic lesions elsewhere, metastatic involvement is considered less likely but not entirely excluded. 3. 5.4 x 3.8 cm unilocular cystic lesion posterior left pelvis. This appears to generate mass-effect on the rectum suggesting it represents a loculated fluid collection or cyst rather than free fluid in the pelvis. While not overtly concerning by CT imaging, pelvic ultrasound recommended to further evaluate. 4. Large hiatal hernia. 5. Small left pleural effusion with left base atelectasis. 6. 2 cm right adrenal nodule has a relative washout of 58%, most suggestive of benign adenoma. 7. Diffuse mesenteric and body wall edema. 8.  Aortic Atherosclerosis (ICD10-I70.0). Electronically Signed   By: Donnal Fusi M.D.   On: 08/28/2023 16:45   EEG adult Result Date: 08/27/2023 Arleene Lack, MD     08/27/2023  2:36 PM Patient Name: UMAIZA WISHON MRN: [id number] Epilepsy Attending: Arleene Lack Referring Physician/Provider: Josph Nimrod, MD Date: 08/27/2023 Duration: 25.33 mins Patient history: 68yo female with ams. EEG to evaluate for seizure Level of alertness:  Awake AEDs during EEG study: VPA Technical aspects: This EEG study was done with scalp electrodes positioned according to the 10-20 International system of electrode placement. Electrical activity was reviewed with band pass filter of 1-70Hz , sensitivity of 7 uV/mm, display speed of 42mm/sec with a 60Hz  notched filter applied as appropriate. EEG data were recorded continuously and digitally stored.  Video monitoring was available and reviewed as appropriate. Description: No clear posterior dominant rhythm was seen.  EEG showed continuous generalized 5 to 7 Hz theta slowing.  Of note patient was noted to have near continuous movements of mouth and head consistent with her history of tardive dyskinesia's.  Therefore EEG was difficult due to movement artifact.  Hyperventilation and photic stimulation were not performed.   ABNORMALITY - Continuous slow, generalized IMPRESSION: This technically difficult study is suggestive of mild diffuse encephalopathy. No seizures or epileptiform discharges were seen throughout the recording. Priyanka O Yadav       Time spent: 50 min     Melodi Sprung, DO [facility] 08/31/2023, 1:01 PM    Dictation software may have been used to generate the above note. Typos may occur and escape review in typed/dictated notes. Please contact Dr Authur Leghorn directly for clarity if needed.  Staff may message me via secure chat in Epic  but this may not receive an immediate response,  please page me for urgent matters!  If 7PM-7AM, please contact night coverage [email or web address]

## 2023-08-31 NOTE — Plan of Care (Signed)

## 2023-08-31 NOTE — TOC Initial Note (Signed)
 Transition of Care [facility]) - Initial/Assessment Note    Patient Details  Name: Tracey Wyatt MRN: [id number] Date of Birth: [birth year]  [facility]) CM/SW Contact:    Odilia Bennett, LCSW Phone Number: 08/31/2023, 3:47 PM  Clinical Narrative: Palliative NP and brother discussed preference for hospice facility which is [facility]. Liaison is aware and will review referral.  Expected Discharge Plan: Hospice Medical Facility Barriers to Discharge: Continued Medical Work up   Patient Goals and CMS Choice   CMS Medicare.gov Compare Post Acute Care list provided to:: Patient        Expected Discharge Plan and Services     Post Acute Care Choice: Hospice Living arrangements for the past 2 months: Assisted Living Facility                                      Prior Living Arrangements/Services Living arrangements for the past 2 months: Assisted Living Facility Lives with:: Facility Resident Patient language and need for interpreter reviewed:: Yes Do you feel safe going back to the place where you live?: Yes      Need for Family Participation in Patient Care: Yes (Comment) Care giver support system in place?: Yes (comment) Current home services: DME Criminal Activity/Legal Involvement Pertinent to Current Situation/Hospitalization: No - Comment as needed  Activities of Daily Living   ADL Screening (condition at time of admission) Independently performs ADLs?: No Does the patient have a NEW difficulty with bathing/dressing/toileting/self-feeding that is expected to last >3 days?: Yes (Initiates electronic notice to provider for possible OT consult) Does the patient have a NEW difficulty with getting in/out of bed, walking, or climbing stairs that is expected to last >3 days?: Yes (Initiates electronic notice to provider for possible PT consult) Does the patient have a NEW difficulty with communication that is expected to last >3 days?: No Is the patient  deaf or have difficulty hearing?: Yes Does the patient have difficulty seeing, even when wearing glasses/contacts?: Yes Does the patient have difficulty concentrating, remembering, or making decisions?: Yes  Permission Sought/Granted Permission sought to share information with : Facility Industrial/product designer granted to share information with : Yes, Verbal Permission Granted     Permission granted to share info w AGENCY: [facility]        Emotional Assessment   Attitude/Demeanor/Rapport: Unable to Assess Affect (typically observed): Unable to Assess Orientation: :  (Unable to assess) Alcohol  / Substance Use: Not Applicable Psych Involvement: No (comment)  Admission diagnosis:  Altered mental status [R41.82] Dementia (HCC) [F03.90] Failure to thrive in adult [R62.7] Altered mental status, unspecified altered mental status type [R41.82] Patient Active Problem List   Diagnosis Date Noted   Dementia (HCC) 08/26/2023   Altered mental status 08/26/2023   HTN (hypertension) 08/25/2023   Seizure (HCC) 08/25/2023   Protein calorie malnutrition (HCC) 08/25/2023   Change in mental status 08/25/2023   Screen for colon cancer 10/29/2016   PCP:  Janis Melena, MD (Inactive) Pharmacy:   [facility] [place], [place] - [address] [place] [address] Phone: [phone] Fax: [phone]     Social Drivers of Health (SDOH) Social History: SDOH Screenings   Food Insecurity: No Food Insecurity (08/26/2023)  Housing: Low Risk  (08/26/2023)  Transportation Needs: No Transportation Needs (08/26/2023)  Utilities: Not At Risk (08/26/2023)  Social Connections: Unknown (08/26/2023)  Tobacco Use: Medium Risk (  08/25/2023)   SDOH Interventions:     Readmission Risk Interventions     No data to display

## 2023-08-31 NOTE — Progress Notes (Signed)
 Dicsussed w/ Meribeth Standard NP of palliative care - family has opted for comfort measures at this time, I agree with this as reasonable and in alignment with goals of the patient as expressed by the family. Comfort orders reviewed and agree. Have d/c abx, no labs or other non-comfort measures. Will maintain her neurologic medications if she is able to take them, to help control discomfort from her movement disorder, but if she is not able to take po can hold or stop these medications and focus on symptoms.

## 2023-08-31 NOTE — Progress Notes (Signed)
 Lab called to report a critical Potassium of 2.5. Made MD aware. Orders placed.

## 2023-08-31 NOTE — Progress Notes (Signed)
  Pt is comfort care- will sign off

## 2023-08-31 NOTE — Progress Notes (Signed)
 PT Cancellation Note  Patient Details Name: Tracey Wyatt MRN: [id number] DOB: April 19, 1956   Cancelled Treatment:    Reason Eval/Treat Not Completed: Medical issues which prohibited therapy: Pt's most recent K this date 2.5 falling outside guidelines for participation with PT services.  Will attempt to see pt at a future date/time as medically appropriate.     DMadalyn Scarce PT, DPT 08/31/23, 1:40 PM

## 2023-08-31 NOTE — Consult Note (Signed)
 Subjective:   CC: Malnutrition  HPI:  Tracey Wyatt is a 68 y.o. female who was consulted by Authur Leghorn for evaluation of above.  Admitted for fall, and recurrent AMS.  Progressive decreased appetite, and worsening neurological status.  Surgery consulted for possible feeding tube placement due to concerns that her malnutrition may be contributing to her overall decline in health.  Brother is at bedside.  Hospital is also inquiring whether or not the pelvic fluid noted on recent CT scan pelvic ultrasound can be drained surgically as well.   Past Medical History:  has a past medical history of Arthritis, Depression, GERD (gastroesophageal reflux disease), Heart murmur, History of hiatal hernia, Hypertension, Pyromania, Seizures (HCC), and Substance abuse (HCC).  Past Surgical History:  has a past surgical history that includes Foot surgery (Bilateral); Colonoscopy; Cholecystectomy (N/A, 04/08/2016); laparoscopy (N/A, 04/08/2016); Esophagogastroduodenoscopy (N/A, 04/08/2016); and Colonoscopy with propofol  (N/A, 11/18/2016).  Family History: family history is not on file.  Social History:  reports that she quit smoking about 27 years ago. Her smoking use included cigarettes. She started smoking about 37 years ago. She has a 5 pack-year smoking history. She has never used smokeless tobacco. She reports that she does not currently use drugs. She reports that she does not drink alcohol .  Current Medications:  Prior to Admission medications   Medication Sig Start Date End Date Taking? Authorizing Provider  Acetaminophen  Extra Strength 500 MG TABS Take 1,000 mg by mouth in the morning and at bedtime.   Yes [provider]  alendronate (FOSAMAX) 70 MG tablet Take 70 mg by mouth once a week. Take with a full glass of water on an empty stomach EVERY WEDNESDAY   Yes [provider]  aspirin  325 MG tablet Take 325 mg by mouth daily.   Yes [provider]  benztropine  (COGENTIN ) 2  MG tablet Take 2 mg by mouth 3 (three) times daily. 07/20/23  Yes [provider]  busPIRone (BUSPAR) 7.5 MG tablet Take 7.5 mg by mouth 2 (two) times daily. 08/17/23  Yes [provider]  Cholecalciferol (VITAMIN D3) 50 MCG (2000 UT) TABS Take 50 mcg by mouth daily.   Yes [provider]  divalproex  (DEPAKOTE ) 250 MG DR tablet Take 250 mg by mouth daily.   Yes [provider]  esomeprazole (NEXIUM) 40 MG capsule Take 40 mg by mouth daily.   Yes [provider]  ferrous sulfate  325 (65 FE) MG tablet Take 325 mg by mouth daily.   Yes [provider]  gabapentin (NEURONTIN) 300 MG capsule Take 300 mg by mouth 4 (four) times daily.   Yes [provider]  levETIRAcetam (KEPPRA) 750 MG tablet Take 750 mg by mouth 2 (two) times daily. 10/17/19  Yes [provider]  lisinopril  (PRINIVIL ,ZESTRIL ) 10 MG tablet Take 10 mg by mouth every morning.   Yes [provider]  metoprolol  succinate (TOPROL -XL) 25 MG 24 hr tablet Take 25 mg by mouth daily.   Yes [provider]  naproxen (NAPROSYN) 500 MG tablet Take 500 mg by mouth 2 (two) times daily with a meal.   Yes [provider]  PARoxetine (PAXIL) 40 MG tablet Take 40 mg by mouth at bedtime.    Yes [provider]  polyethylene glycol (MIRALAX  / GLYCOLAX ) packet Take 17 g by mouth daily.   Yes [provider]    Allergies:  Allergies  Allergen Reactions   A+D Diaper Rash [Diaper Rash Products]    Morphine Other (  See Comments)    Other Reaction: Not Assessed   Procaine Other (See Comments)    ROS:  Unable to obtain secondary to patient mentation   Objective:     BP 123/83 (BP Location: Right Arm)   Pulse (!) 103   Temp 98.2 F (36.8 C)   Resp 20   Ht 5\' 6"  (1.676 m)   Wt 36.2 kg   SpO2 92%   BMI 12.88 kg/m   Physical exam deferred at this time   LABS:     Latest Ref Rng & Units 08/31/2023    6:23 AM 08/30/2023    9:17 AM  08/29/2023    5:49 PM  CMP  Glucose 70 - 99 mg/dL 86  99    BUN 8 - 23 mg/dL 7  9    Creatinine 1.61 - 1.00 mg/dL 0.96  <0.45  4.09   Sodium 135 - 145 mmol/L 133  131    Potassium 3.5 - 5.1 mmol/L 2.5  2.5    Chloride 98 - 111 mmol/L 100  97    CO2 22 - 32 mmol/L 24  22    Calcium 8.9 - 10.3 mg/dL 7.4  6.9    Total Protein 6.5 - 8.1 g/dL  4.7    Total Bilirubin 0.0 - 1.2 mg/dL  0.5    Alkaline Phos 38 - 126 U/L  67    AST 15 - 41 U/L  20    ALT 0 - 44 U/L  14        Latest Ref Rng & Units 08/31/2023    6:23 AM 08/30/2023    9:17 AM 08/29/2023   11:43 AM  CBC  WBC 4.0 - 10.5 K/uL 18.4  27.3  23.0   Hemoglobin 12.0 - 15.0 g/dL 81.1  91.4  78.2   Hematocrit 36.0 - 46.0 % 29.1  28.7  29.6   Platelets 150 - 400 K/uL 265  247  273     RADS: CLINICAL DATA:  68 year old female with a cystic lesion in the posterior left hemipelvis on CT 3 days ago. Further characterization.   EXAM: TRANSABDOMINAL ULTRASOUND OF PELVIS   TECHNIQUE: Transabdominal ultrasound examination of the pelvis was performed including evaluation of the uterus, ovaries, adnexal regions, and pelvic cul-de-sac.   COMPARISON:  CT Abdomen and Pelvis 08/28/2023.   FINDINGS: Uterus   Measurements: 5.0 x 1.6 x 1.9 cm = volume: 8 mL. No fibroids or other mass visualized.   Endometrium   Thickness: 3 mm.  No focal abnormality visualized.   Right ovary   No normal right ovarian parenchyma can be identified.   Left ovary   No normal left ovarian parenchyma can be identified.   Other findings: Foley catheter decompressing the urinary bladder now (images 22, 38).   Oval mildly irregularly shaped fluid collection in the left hemipelvis corresponding to the CT finding is 61 x 33 x 52 mm. Estimated volume 52 mL. This is in the region of the left adnexa. Perhaps minimal internal septation (image 40). But there are no vascular elements on color Doppler (image 28). And otherwise simple fluid density.   No  superimposed layering free fluid identified in the pelvis.   IMPRESSION: 1. Left hemipelvis simple fluid density collection with no vascular elements measuring up to 6.1 cm, estimated volume 52 mL. This is in the vicinity of the left adnexa, although neither ovary can be identified. 2. Diminutive, normal uterus.     Electronically Signed   By:  Marlise Simpers M.D.   On: 08/31/2023 10:56  CLINICAL DATA:  Weakness. Weight loss. Occult malignancy. * Tracking Code: BO *   EXAM: CT CHEST, ABDOMEN, AND PELVIS WITH CONTRAST   TECHNIQUE: Multidetector CT imaging of the chest, abdomen and pelvis was performed following the standard protocol during bolus administration of intravenous contrast.   RADIATION DOSE REDUCTION: This exam was performed according to the departmental dose-optimization program which includes automated exposure control, adjustment of the mA and/or kV according to patient size and/or use of iterative reconstruction technique.   CONTRAST:  OMNIPAQUE  IOHEXOL  300 MG/ML  SOLN   COMPARISON:  None Available.   FINDINGS: CT CHEST FINDINGS   Cardiovascular: The heart size is normal. No substantial pericardial effusion. Mild atherosclerotic calcification is noted in the wall of the thoracic aorta. Aberrant origin right subclavian artery noted.   Mediastinum/Nodes: No mediastinal lymphadenopathy. There is no hilar lymphadenopathy. Large hiatal hernia. The esophagus has normal imaging features. There is no axillary lymphadenopathy.   Lungs/Pleura: No suspicious pulmonary nodule or mass. Left base atelectasis with small left pleural effusion.   Musculoskeletal: No worrisome lytic or sclerotic osseous abnormality. Chronic superior endplate compression deformity noted at T12.   CT ABDOMEN PELVIS FINDINGS   Hepatobiliary: No suspicious focal abnormality within the liver parenchyma. History of cholecystectomy. No intrahepatic or extrahepatic biliary dilation. Common  bile duct measures 4 mm diameter in the pancreatic head.   Pancreas: Diffuse parenchymal atrophy without main duct dilatation.   Spleen: 2.7 cm homogeneous water density lesion in the posterior spleen has benign appearance, suggesting cyst or pseudocyst.   Adrenals/Urinary Tract: 2 cm right adrenal nodule has a relative washout of 58%, most suggestive of benign adenoma. No left adrenal nodule or mass. Kidneys unremarkable. No evidence for hydroureter. Bladder is distended.   Stomach/Bowel: Large hiatal hernia. Distal stomach and duodenum are nondilated No small bowel wall thickening. No small bowel dilatation. The terminal ileum is not discretely visualized. The appendix is not well visualized, but there is no edema or inflammation in the region of the cecal tip to suggest appendicitis. Colon is largely decompressed although there is some mild gaseous distension in the hepatic flexure. Prominent rectal stool volume.   Vascular/Lymphatic: There is mild atherosclerotic calcification of the abdominal aorta without aneurysm. There is no gastrohepatic or hepatoduodenal ligament lymphadenopathy. No retroperitoneal or mesenteric lymphadenopathy. No pelvic sidewall lymphadenopathy.   Reproductive: Uterus not well visualized. No evidence for right adnexal mass. 5.4 x 3.8 cm unilocular cystic lesion identified posterior left pelvis. This appears to generate mass-effect on the rectum (see coronal 57/5) suggesting it represents a loculated fluid collection or cyst rather than free fluid in the pelvis.   Other: There is diffuse mesenteric and body wall edema.   Musculoskeletal: Small gas bubble is identified in the subcutaneous fat of the low anterior midline abdominal wall, presumably an injection site. Markedly heterogeneous mineralization of the sacrum. This is probably related to bilateral insufficiency fractures. Imaging appearance is not entirely characteristic for Paget's disease.  Given lack of sclerotic lesions elsewhere, metastatic involvement is considered less likely but not entirely excluded.   IMPRESSION: 1. No definite evidence for malignancy in the chest, abdomen, or pelvis. 2. Markedly heterogeneous mineralization of the sacrum. This is probably related to bilateral insufficiency fractures. Imaging appearance is not entirely characteristic for Paget's disease. Given lack of sclerotic lesions elsewhere, metastatic involvement is considered less likely but not entirely excluded. 3. 5.4 x 3.8 cm unilocular cystic lesion posterior left  pelvis. This appears to generate mass-effect on the rectum suggesting it represents a loculated fluid collection or cyst rather than free fluid in the pelvis. While not overtly concerning by CT imaging, pelvic ultrasound recommended to further evaluate. 4. Large hiatal hernia. 5. Small left pleural effusion with left base atelectasis. 6. 2 cm right adrenal nodule has a relative washout of 58%, most suggestive of benign adenoma. 7. Diffuse mesenteric and body wall edema. 8.  Aortic Atherosclerosis (ICD10-I70.0).     Electronically Signed   By: Donnal Fusi M.D.   On: 08/28/2023 16:45   Assessment:      Malnutrition Pelvic fully fluid of unknown significance  Plan:     Patient's brother stated that he is scheduled to have a multidisciplinary team meeting regarding sisters overall health status and whether or not to continue pursuing any further treatments including placement of a feeding tube.  Surgery will be on standby and will discuss with brother in further detail regarding the surgery itself if they wish to proceed with feeding tube placement.  Hospitalist discussed case with interventional radiology and per report the do not believe IR guided feeding tube placement is feasible at this time.    We would likely be able to proceed with fluid analysis and aspiration at the time of feeding tube placement if status  what family decides on.  Regarding further workup with MRI of the brain under general anesthesia, logistics may be extremely complicated to where it may not be possible to proceed with MRI under general anesthesia.  MR tech recommended proceeding with possible sedation and specific protocols to minimize time on the machine to see if adequate images can be obtained for interpretation.    UPDATE: family has decided to proceed with comfort care.  Surgery to sign off.   Labs/images/medications/previous chart entries reviewed personally and relevant changes/updates noted above.

## 2023-09-01 DIAGNOSIS — R410 Disorientation, unspecified: Secondary | ICD-10-CM | POA: Diagnosis not present

## 2023-09-01 DIAGNOSIS — Z7189 Other specified counseling: Secondary | ICD-10-CM | POA: Diagnosis not present

## 2023-09-01 LAB — COPPER, SERUM: Copper: 69 ug/dL — ABNORMAL LOW (ref 80–158)

## 2023-09-01 LAB — MULTIPLE MYELOMA PANEL, SERUM
Albumin SerPl Elph-Mcnc: 2.2 g/dL — ABNORMAL LOW (ref 2.9–4.4)
Albumin/Glob SerPl: 1.1 (ref 0.7–1.7)
Alpha 1: 0.3 g/dL (ref 0.0–0.4)
Alpha2 Glob SerPl Elph-Mcnc: 0.9 g/dL (ref 0.4–1.0)
B-Globulin SerPl Elph-Mcnc: 0.7 g/dL (ref 0.7–1.3)
Gamma Glob SerPl Elph-Mcnc: 0.3 g/dL — ABNORMAL LOW (ref 0.4–1.8)
Globulin, Total: 2.2 g/dL (ref 2.2–3.9)
IgA: 94 mg/dL (ref 87–352)
IgG (Immunoglobin G), Serum: 343 mg/dL — ABNORMAL LOW (ref 586–1602)
IgM (Immunoglobulin M), Srm: 43 mg/dL (ref 26–217)
Total Protein ELP: 4.4 g/dL — ABNORMAL LOW (ref 6.0–8.5)

## 2023-09-01 LAB — CULTURE, BLOOD (ROUTINE X 2)
Special Requests: ADEQUATE
Special Requests: ADEQUATE

## 2023-09-01 LAB — URINE CULTURE: Culture: >=100000 — AB

## 2023-09-01 LAB — ANA W/REFLEX IF POSITIVE: Anti Nuclear Antibody (ANA): NEGATIVE

## 2023-09-01 MED ORDER — SENNOSIDES-DOCUSATE SODIUM 8.6-50 MG PO TABS
2.0000 | ORAL_TABLET | Freq: Every evening | ORAL | Status: DC | PRN
Start: 2023-09-01 — End: 2023-09-04

## 2023-09-01 NOTE — Progress Notes (Signed)
PROGRESS NOTE    RYAN PALERMO   WUJ:[id number] DOB: 02/22/1956  DOA: 08/25/2023 Date of Service: 09/01/23 which is hospital day 6  PCP: Leia Alf, MD (Inactive)    Hospital course / significant events:   HPI: Ms. Frier, a 68 y/o with h/o HTN, tardive dyskinesia, vague history of seizure disorder, psychiatric disorders - depression, schizophrenia, pyromania. She took haldol for many years and developed tardive diskinesia but has been of haldol and risperidone for >10 years. She has resided in a long term care facility for 20 years. . Over the past year she has slowly declined: losing weight, hair turned gray, she had behavior problems. For  two months she has had a progressive decline and over the past several weeks has become withdrawn and less communicative, decreased PO intake, worsening weight loss, and progressive decline in ability to manage ADLs and ambulate. Her brother is the primary historian and has been involved with her care for years.   Detailed neurology consult note 02/07 for other hisory:   "While she was treated with Haldol and risperidone for many years they reports she has been off of any of those medications for at least 1 or 2 decades.  Unfortunately she has had progressive decline in her cognitive abilities as well as weight loss for the past year despite eating well   Due to her weight loss they were encouraging her to eat more and noted that she was still eating well 3 weeks ago although she was more slouched over fatigued appearing.  Now for the past 3 weeks she has hardly been able to eat and not walking well   Additionally they note that 6 - 8 weeks ago she was noted to be more aggressive with the staff hitting etc. and thinks some medication changes may have been made at the time, but they are unclear on details"  She was seen in [facility] 08/24/23 for these problems. Her workup including labs, CT brain, U/A were unrevealing. She was returned to her  long term care facility. She now returns to [facility] with worsening symptoms, essentially non-communicative.    02/04: to ED 02/05: admitted to hospitalist service, unable to get EEG until tomorrow. Nonverbal, somnolent.  02/06: More awake today but disoriented, slow/difficult speech. SLP, PT, OT to see. EEG technically difficult study suggestive of mild diffuse encephalopathy. Neuro consult ordered. Qualifies for SNF rehab but concern for po intake 02/07: awake and interactive today, not eating much. Neuro recs as below - w/u movement d/o for reversible causes, reviewing meds for possible reversible causes. See A/P for full discussion - if feeding tube would provide nutrition while treatment takes time to help her chorea, then this would be reasonable but if the condition is expected to stay stable or deteriorate family would not want feeding tube and will consider comfort measures 02/08: more somnolent today and not eating. Palliative to see - see consult note. WBC up, infectious w/u and abx.  02/09: more alert this morning, says she's hungry, making jokes. NG attempted but pt did not tolerate bedside placement. Continue abx  02/10: presented options but it is complicated, feeding tube has risks and would not likely address underlying frailty given how low BMI has gotten, but could try to address nutrition and see if this helps / buys time to reassess QOL. Option for G-tube placement under anesthesia and would consider MRI and address pelvic cyst as well while under anesthesia which would require detailed coordination but is feasible, is also  a lot to put the patient through. Family given time to consider. Family meeting was initially planned but decision made prior to that to transition to comfort measures, d/c aggressive workup and treatment, in line with patient/family goals  02/11: remains somnolent, stable, not eating. Hospice reassess - not candidate for hospice but we don't have a good option for  placement at home or other facility, expect will decompensate fairly quickly given frailty, sepsis.    Consultants:  Neurology  Palliative care Infectious disease Interventional Radiology General surgery   Procedures/Surgeries: none      ASSESSMENT & PLAN:   Change in mental status /  Metabolic Encephalopathy  Progressive movement disorder working dx is tardive dyskinesia  Questionable seizure history  D/c meds and other workup Comfort measures    SIRS/Sepsis Bacteremia - (+)BCx Staph Aureus  D/c meds and other workup Comfort measures   Protein calorie malnutrition  Weight loss Long term calorie deprivation.  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  Underweight - under 18  overweight - 25 to 29 obese - 30 or more Class 1 obesity: BMI of 30.0 to 34 Class 2 obesity: BMI of 35.0 to 39 Class 3 obesity: BMI of 40.0 to 49 Super Morbid Obesity: BMI 50-59 Super-super Morbid Obesity: BMI 60+ Significantly low or high BMI is associated with higher medical risk.  Weight management advised as adjunct to other disease management and risk reduction treatments    DVT prophylaxis: n/a on comfort measures  IV fluids: n/a on comfort measures   Nutrition: n/a on comfort measures - diet for comfort as desired  Central lines / invasive  devices: none  Code Status: DNR ACP documentation reviewed:  none on file in [facility]  TOC needs: hospice following possible placement  Barriers to dispo / significant pending items: anticipate hospital death vs may qualify for hospice house, no good placement options at the moment                Subjective: Pt unable to contribute   Family Communication: called brother 09/01/23 5:27 PM left HIPAA compliant voicemail    Objective Findings:  Vitals:   08/31/23 0812 08/31/23 1558 08/31/23 1711 08/31/23 2045  BP:  110/74 108/75 121/86  Pulse: 99 (!) 55 (!) 115 93  Resp:  19 18   Temp:  98.9 F (37.2 C)  98.3 F (36.8 C)  TempSrc:      SpO2:  97% 97% 99%  Weight:      Height:        Intake/Output Summary (Last 24 hours) at 09/01/2023 1727 Last data filed at 09/01/2023 1528 Gross per 24 hour  Intake 500.55 ml  Output --  Net 500.55 ml    Filed Weights   08/26/23 0030 08/26/23 2147  Weight: 43.1 kg 36.2 kg    Examination:  Physical Exam Constitutional:      General: She is not in acute distress.    Appearance: She is ill-appearing. She is not toxic-appearing.     Comments: frail  Cardiovascular:     Rate and Rhythm: Normal rate and regular rhythm.  Pulmonary:     Effort: Pulmonary effort is normal.  Abdominal:     General: Abdomen is flat.     Palpations: Abdomen is soft.  Skin:    General: Skin is dry.  Neurological:     Mental Status: She is disoriented.          Scheduled Medications:   benztropine  1 mg Oral TID   Chlorhexidine Gluconate Cloth  6 each Topical Daily   OLANZapine  2.5 mg Intramuscular BID    Continuous Infusions:  valproate sodium Stopped (09/01/23 0951)    PRN Medications:  acetaminophen **OR** acetaminophen, antiseptic oral rinse, glycopyrrolate **OR** glycopyrrolate **OR** glycopyrrolate, haloperidol **OR** haloperidol **OR** haloperidol lactate, hydrALAZINE, LORazepam **OR** LORazepam **OR** LORazepam,  LORazepam, ondansetron **OR** ondansetron (ZOFRAN) IV, oxyCODONE **OR** oxyCODONE, polyvinyl alcohol, senna-docusate  Antimicrobials from admission:  Anti-infectives (From admission, onward)    Start     Dose/Rate Route Frequency Ordered Stop   08/30/23 1900  vancomycin (VANCOREADY) IVPB 500 mg/100 mL  Status:  Discontinued        500 mg 100 mL/hr over 60 Minutes Intravenous Every 24 hours 08/29/23 1839 08/30/23 0437   08/30/23 1400  ceFAZolin (ANCEF) IVPB 2g/100 mL premix  Status:  Discontinued        2 g 200 mL/hr over 30 Minutes Intravenous Every 8 hours 08/30/23 0825 08/31/23 1627   08/30/23 0600  ceFAZolin (ANCEF) IVPB 1 g/50 mL premix  Status:  Discontinued        1 g 100 mL/hr over 30 Minutes Intravenous Every 8 hours 08/30/23 0436 08/30/23 0825   08/29/23 1830  cefTRIAXone (ROCEPHIN) 1 g in sodium chloride 0.9 % 100 mL IVPB  Status:  Discontinued        1 g 200 mL/hr over 30 Minutes Intravenous Every 24 hours 08/29/23 1727 08/30/23 0436   08/29/23 1830  vancomycin (VANCOCIN) 750 mg in sodium chloride 0.9 % 250 mL IVPB        750 mg 250 mL/hr over 60 Minutes Intravenous Once 08/29/23 1734 08/29/23 2221           Data Reviewed:  I have personally reviewed the following...  CBC: Recent Labs  Lab 08/25/23 2046 08/29/23 1143 08/30/23 0917 08/31/23 0623  WBC 5.5 23.0* 27.3* 18.4*  NEUTROABS 3.6 21.2*  --   --   HGB 10.6* 10.3* 10.0* 10.6*  HCT 31.5* 29.6* 28.7* 29.1*  MCV 90.0 87.3 86.2 83.4  PLT 204 273 247 265   Basic Metabolic Panel: Recent Labs  Lab 08/25/23 1924 08/29/23 1749 08/30/23 0909 08/30/23 0917 08/31/23 0623  NA 141  --   --  131* 133*  K 4.0  --   --  2.5* 2.5*  CL 106  --   --  97* 100  CO2 26  --   --  22 24  GLUCOSE 95  --   --  99 86  BUN 39*  --   --  9 7*  CREATININE 0.50 0.33*  --  <0.30* 0.32*  CALCIUM 8.3*  --   --  6.9* 7.4*  MG  --   --  1.5*  --  2.0  PHOS  --   --   --   --  1.4*   GFR: Estimated Creatinine Clearance:  38.5 mL/min (A) (by C-G formula based on SCr of 0.32 mg/dL (L)). Liver Function Tests: Recent Labs  Lab 08/25/23 1924 08/30/23 0917  AST 16 20  ALT 14 14  ALKPHOS 36* 67  BILITOT 0.8 0.5  PROT 5.4* 4.7*  ALBUMIN 2.7* 2.0*   No results for input(s): "LIPASE", "AMYLASE" in the last 168 hours. Recent Labs  Lab 08/27/23 1420  AMMONIA 25   Coagulation Profile: No results for input(s): "INR", "PROTIME" in the last 168 hours. Cardiac Enzymes: Recent Labs  Lab 08/28/23 1022  CKTOTAL 169   BNP (last 3 results) No results for input(s): "PROBNP" in the last 8760 hours. HbA1C: No results for input(s): "HGBA1C" in the last 72 hours. CBG: Recent Labs  Lab 08/28/23 2009 08/29/23 0803 08/29/23 1113 08/29/23 1557 08/29/23 2012  GLUCAP 109* 109* 105* 91 93   Lipid Profile: No results for input(s): "CHOL", "HDL", "LDLCALC", "TRIG", "CHOLHDL", "LDLDIRECT" in the last 72 hours. Thyroid Function Tests: No results for input(s): "TSH", "T4TOTAL", "FREET4", "T3FREE", "THYROIDAB" in the last 72 hours.  Anemia Panel: No results for input(s): "VITAMINB12", "FOLATE", "FERRITIN", "TIBC", "IRON", "RETICCTPCT" in the last 72 hours.  Most Recent Urinalysis On File:     Component Value Date/Time   COLORURINE AMBER (A) 08/30/2023 0859   APPEARANCEUR CLOUDY (A) 08/30/2023 0859   LABSPEC 1.021 08/30/2023 0859   PHURINE 7.0 08/30/2023 0859   GLUCOSEU NEGATIVE 08/30/2023 0859   HGBUR NEGATIVE 08/30/2023 0859   BILIRUBINUR NEGATIVE 08/30/2023 0859   KETONESUR NEGATIVE 08/30/2023 0859   PROTEINUR 100 (A) 08/30/2023 0859   NITRITE NEGATIVE 08/30/2023 0859   LEUKOCYTESUR NEGATIVE 08/30/2023 0859   Sepsis Labs: @LABRCNTIP (procalcitonin:4,lacticidven:4) Microbiology: Recent Results (from the past 240 hours)  Resp panel by RT-PCR (RSV, Flu A&B, Covid) Urine, Catheterized     Status: None   Collection Time: 08/24/23 10:36 PM   Specimen: Urine, Catheterized; Nasal Swab  Result Value Ref Range  Status   SARS Coronavirus 2 by RT PCR NEGATIVE NEGATIVE Final    Comment: (NOTE) SARS-CoV-2 target nucleic acids are NOT DETECTED.  The SARS-CoV-2 RNA is generally detectable in upper respiratory specimens during the acute phase of infection. The lowest concentration of SARS-CoV-2 viral copies this assay can detect is 138 copies/mL. A negative result does not preclude SARS-Cov-2 infection and should not be used as the sole basis for treatment or other patient management decisions. A negative result may occur with  improper specimen collection/handling, submission of specimen other than nasopharyngeal swab, presence of viral mutation(s) within the areas targeted by this assay, and inadequate number of viral copies(<138 copies/mL). A negative result must be combined with clinical observations, patient history, and epidemiological information. The expected result is Negative.  Fact Sheet for Patients:  BloggerCourse.com  Fact Sheet for Healthcare Providers:  SeriousBroker.it  This test is no t yet approved or cleared by the [place] FDA and  has been authorized for detection and/or diagnosis of SARS-CoV-2 by FDA under an Emergency Use Authorization (EUA). This EUA will remain  in effect (meaning this test can be used) for the duration of the COVID-19 declaration under Section 564(b)(1) of the Act, 21 U.S.C.section 360bbb-3(b)(1), unless the authorization is terminated  or revoked sooner.       Influenza A by PCR NEGATIVE NEGATIVE Final   Influenza B by PCR NEGATIVE NEGATIVE Final    Comment: (NOTE) The Xpert Xpress SARS-CoV-2/FLU/RSV plus assay is intended as an aid in the diagnosis  of influenza from Nasopharyngeal swab specimens and should not be used as a sole basis for treatment. Nasal washings and aspirates are unacceptable for Xpert Xpress SARS-CoV-2/FLU/RSV testing.  Fact Sheet for  Patients: BloggerCourse.com  Fact Sheet for Healthcare Providers: SeriousBroker.it  This test is not yet approved or cleared by the [place] FDA and has been authorized for detection and/or diagnosis of SARS-CoV-2 by FDA under an Emergency Use Authorization (EUA). This EUA will remain in effect (meaning this test can be used) for the duration of the COVID-19 declaration under Section 564(b)(1) of the Act, 21 U.S.C. section 360bbb-3(b)(1), unless the authorization is terminated or revoked.     Resp Syncytial Virus by PCR NEGATIVE NEGATIVE Final    Comment: (NOTE) Fact Sheet for Patients: BloggerCourse.com  Fact Sheet for Healthcare Providers: SeriousBroker.it  This test is not yet approved or cleared by the [place] FDA and has been authorized for detection and/or diagnosis of SARS-CoV-2 by FDA under an Emergency Use Authorization (EUA). This EUA will remain in effect (meaning this test can be used) for the duration of the COVID-19 declaration under Section 564(b)(1) of the Act, 21 U.S.C. section 360bbb-3(b)(1), unless the authorization is terminated or revoked.  Performed at [facility], [address] [place], [place] [address]   MRSA Next Gen by PCR, Nasal     Status: None   Collection Time: 08/27/23  6:00 AM   Specimen: Nasal Mucosa; Nasal Swab  Result Value Ref Range Status   MRSA by PCR Next Gen NOT DETECTED NOT DETECTED Final    Comment: (NOTE) The GeneXpert MRSA Assay (FDA approved for NASAL specimens only), is one component of a comprehensive MRSA colonization surveillance program. It is not intended to diagnose MRSA infection nor to guide or monitor treatment for MRSA infections. Test performance is not FDA approved in patients less than 49 years old. Performed at [facility], [address] [place], [place] [address]   Culture,  blood (Routine X 2) w Reflex to ID Panel     Status: Abnormal   Collection Time: 08/29/23  3:09 PM   Specimen: BLOOD  Result Value Ref Range Status   Specimen Description   Final    BLOOD RAC Performed at [facility], [address] [place], [place] [address]    Special Requests   Final    BOTTLES DRAWN AEROBIC AND ANAEROBIC Blood Culture adequate volume Performed at [facility], [address], [place], [place] [address]    Culture  Setup Time   Final    GRAM POSITIVE COCCI IN BOTH AEROBIC AND ANAEROBIC BOTTLES Organism ID to follow CRITICAL RESULT CALLED TO, READ BACK BY AND VERIFIED WITH: NATHAN BELUE @ 0124 08/30/23 [facility] Performed at [facility], [address] [place], [place] [address]    Culture STAPHYLOCOCCUS AUREUS (A)  Final   Report Status 09/01/2023 FINAL  Final   Organism ID, Bacteria STAPHYLOCOCCUS AUREUS  Final      Susceptibility   Staphylococcus aureus - MIC*    CIPROFLOXACIN <=0.5 SENSITIVE Sensitive     ERYTHROMYCIN RESISTANT Resistant     GENTAMICIN <=0.5 SENSITIVE Sensitive     OXACILLIN <=0.25 SENSITIVE Sensitive     TETRACYCLINE <=1 SENSITIVE Sensitive     VANCOMYCIN 1 SENSITIVE Sensitive     TRIMETH/SULFA <=10 SENSITIVE Sensitive     CLINDAMYCIN RESISTANT Resistant     RIFAMPIN <=0.5 SENSITIVE Sensitive     Inducible Clindamycin POSITIVE Resistant  LINEZOLID 2 SENSITIVE Sensitive     * STAPHYLOCOCCUS AUREUS  Culture, blood (Routine X 2) w Reflex to ID Panel     Status: Abnormal   Collection Time: 08/29/23  3:09 PM   Specimen: BLOOD  Result Value Ref Range Status   Specimen Description   Final    BLOOD LAC Performed at [facility], [address], [place], [place] [address]    Special Requests   Final    BOTTLES DRAWN AEROBIC AND ANAEROBIC Blood Culture adequate volume Performed at [facility], [address] [place], [place] [address]    Culture  Setup Time   Final    GRAM POSITIVE  COCCI IN BOTH AEROBIC AND ANAEROBIC BOTTLES CRITICAL RESULT CALLED TO, READ BACK BY AND VERIFIED WITHDawayne Cirri @ 7564 08/30/23 [facility] Performed at [facility], [address] [place], [place] [address]    Culture (A)  Final    STAPHYLOCOCCUS AUREUS SUSCEPTIBILITIES PERFORMED ON PREVIOUS CULTURE WITHIN THE LAST 5 DAYS. Performed at [facility], [address], [place], [place] [address]    Report Status 09/01/2023 FINAL  Final  Blood Culture ID Panel (Reflexed)     Status: Abnormal   Collection Time: 08/29/23  3:09 PM  Result Value Ref Range Status   Enterococcus faecalis NOT DETECTED NOT DETECTED Final   Enterococcus Faecium NOT DETECTED NOT DETECTED Final   Listeria monocytogenes NOT DETECTED NOT DETECTED Final   Staphylococcus species DETECTED (A) NOT DETECTED Final    Comment: CRITICAL RESULT CALLED TO, READ BACK BY AND VERIFIED WITH: NATHAN BELUE @ 0124 08/30/23 [facility]    Staphylococcus aureus (BCID) DETECTED (A) NOT DETECTED Final    Comment: CRITICAL RESULT CALLED TO, READ BACK BY AND VERIFIED WITH: NATHAN BELUE @ 0124 08/30/23 [facility]    Staphylococcus epidermidis NOT DETECTED NOT DETECTED Final   Staphylococcus lugdunensis NOT DETECTED NOT DETECTED Final   Streptococcus species NOT DETECTED NOT DETECTED Final   Streptococcus agalactiae NOT DETECTED NOT DETECTED Final   Streptococcus pneumoniae NOT DETECTED NOT DETECTED Final   Streptococcus pyogenes NOT DETECTED NOT DETECTED Final   A.calcoaceticus-baumannii NOT DETECTED NOT DETECTED Final   Bacteroides fragilis NOT DETECTED NOT DETECTED Final   Enterobacterales NOT DETECTED NOT DETECTED Final   Enterobacter cloacae complex NOT DETECTED NOT DETECTED Final   Escherichia coli NOT DETECTED NOT DETECTED Final   Klebsiella aerogenes NOT DETECTED NOT DETECTED Final   Klebsiella oxytoca NOT DETECTED NOT DETECTED Final   Klebsiella pneumoniae NOT DETECTED NOT DETECTED Final   Proteus species NOT DETECTED NOT DETECTED  Final   Salmonella species NOT DETECTED NOT DETECTED Final   Serratia marcescens NOT DETECTED NOT DETECTED Final   Haemophilus influenzae NOT DETECTED NOT DETECTED Final   Neisseria meningitidis NOT DETECTED NOT DETECTED Final   Pseudomonas aeruginosa NOT DETECTED NOT DETECTED Final   Stenotrophomonas maltophilia NOT DETECTED NOT DETECTED Final   Candida albicans NOT DETECTED NOT DETECTED Final   Candida auris NOT DETECTED NOT DETECTED Final   Candida glabrata NOT DETECTED NOT DETECTED Final   Candida krusei NOT DETECTED NOT DETECTED Final   Candida parapsilosis NOT DETECTED NOT DETECTED Final   Candida tropicalis NOT DETECTED NOT DETECTED Final   Cryptococcus neoformans/gattii NOT DETECTED NOT DETECTED Final   Meth resistant mecA/C and MREJ NOT DETECTED NOT DETECTED Final    Comment: Performed at [facility], [address], [place], [place] [address]  Urine Culture (for pregnant, neutropenic or urologic patients  or patients with an indwelling urinary catheter)     Status: Abnormal   Collection Time: 08/30/23  8:59 AM   Specimen: In/Out Cath Urine  Result Value Ref Range Status   Specimen Description   Final    IN/OUT CATH URINE Performed at [facility], [address] [place], [place] [address]    Special Requests   Final    NONE Performed at [facility], [address] [place], [place] [address]    Culture (A)  Final    >=100,000 COLONIES/mL ENTEROCOCCUS FAECALIS >=100,000 COLONIES/mL STAPHYLOCOCCUS SIMULANS    Report Status 09/01/2023 FINAL  Final   Organism ID, Bacteria ENTEROCOCCUS FAECALIS (A)  Final   Organism ID, Bacteria STAPHYLOCOCCUS SIMULANS (A)  Final      Susceptibility   Enterococcus faecalis - MIC*    AMPICILLIN <=2 SENSITIVE Sensitive     NITROFURANTOIN <=16 SENSITIVE Sensitive     VANCOMYCIN 1 SENSITIVE Sensitive     * >=100,000 COLONIES/mL ENTEROCOCCUS FAECALIS   Staphylococcus simulans - MIC*    CIPROFLOXACIN >=8  RESISTANT Resistant     GENTAMICIN <=0.5 SENSITIVE Sensitive     NITROFURANTOIN <=16 SENSITIVE Sensitive     OXACILLIN >=4 RESISTANT Resistant     TETRACYCLINE 2 SENSITIVE Sensitive     VANCOMYCIN <=0.5 SENSITIVE Sensitive     TRIMETH/SULFA <=10 SENSITIVE Sensitive     RIFAMPIN 1 SENSITIVE Sensitive     Inducible Clindamycin NEGATIVE Sensitive     * >=100,000 COLONIES/mL STAPHYLOCOCCUS SIMULANS  Culture, blood (Routine X 2) w Reflex to ID Panel     Status: None (Preliminary result)   Collection Time: 08/31/23  6:23 AM   Specimen: BLOOD RIGHT HAND  Result Value Ref Range Status   Specimen Description BLOOD RIGHT HAND  Final   Special Requests   Final    BOTTLES DRAWN AEROBIC AND ANAEROBIC Blood Culture results may not be optimal due to an inadequate volume of blood received in culture bottles   Culture   Final    NO GROWTH < 24 HOURS Performed at [facility], [address] [place], [place] [address]    Report Status PENDING  Incomplete  Culture, blood (Routine X 2) w Reflex to ID Panel     Status: None (Preliminary result)   Collection Time: 08/31/23  2:01 PM   Specimen: BLOOD  Result Value Ref Range Status   Specimen Description BLOOD BLOOD RIGHT WRIST  Final   Special Requests   Final    BOTTLES DRAWN AEROBIC AND ANAEROBIC Blood Culture results may not be optimal due to an inadequate volume of blood received in culture bottles   Culture   Final    NO GROWTH < 24 HOURS Performed at [facility], [address], [place], [place] [address]    Report Status PENDING  Incomplete      Radiology Studies last 3 days: ECHOCARDIOGRAM COMPLETE Result Date: 08/31/2023    ECHOCARDIOGRAM REPORT   Patient Name:   NAVAYA WIATREK Date of Exam: 08/31/2023 Medical Rec #:  [id number]         Height:       66.0 in Accession #:    [id number]        Weight:       79.8 lb Date of Birth:  1956/03/04         BSA:          1.353 m Patient Age:    11 years  BP:            123/83 mmHg Patient Gender: F                 HR:           97 bpm. Exam Location:  [facility] Procedure: 2D Echo, Cardiac Doppler and Color Doppler Indications:     Bacteremia  History:         Patient has no prior history of Echocardiogram examinations.                  Signs/Symptoms:Bacteremia and Altered Mental Status; Risk                  Factors:Hypertension.  Sonographer:     Mikki Harbor Referring Phys:  [id number] Lynn Ito Diagnosing Phys: Rozell Searing Custovic IMPRESSIONS  1. Left ventricular ejection fraction, by estimation, is 35 to 40%. Left ventricular ejection fraction by 2D MOD biplane is 37.8 %. The left ventricle has moderately decreased function. The left ventricle demonstrates regional wall motion abnormalities (see scoring diagram/findings for description). Left ventricular diastolic parameters are consistent with Grade I diastolic dysfunction (impaired relaxation).  2. Right ventricular systolic function is normal. The right ventricular size is normal. There is normal pulmonary artery systolic pressure. The estimated right ventricular systolic pressure is 27.8 mmHg.  3. The mitral valve is normal in structure. Trivial mitral valve regurgitation. No evidence of mitral stenosis.  4. The aortic valve is normal in structure. Aortic valve regurgitation is not visualized. No aortic stenosis is present. FINDINGS  Left Ventricle: Left ventricular ejection fraction, by estimation, is 35 to 40%. Left ventricular ejection fraction by 2D MOD biplane is 37.8 %. The left ventricle has moderately decreased function. The left ventricle demonstrates regional wall motion abnormalities. The left ventricular internal cavity size was normal in size. There is no left ventricular hypertrophy. Left ventricular diastolic parameters are consistent with Grade I diastolic dysfunction (impaired relaxation).  LV Wall Scoring: The mid and distal anterior septum, mid inferoseptal segment, and apex are hypokinetic. Right  Ventricle: The right ventricular size is normal. No increase in right ventricular wall thickness. Right ventricular systolic function is normal. There is normal pulmonary artery systolic pressure. The tricuspid regurgitant velocity is 2.49 m/s, and  with an assumed right atrial pressure of 3 mmHg, the estimated right ventricular systolic pressure is 27.8 mmHg. Left Atrium: Left atrial size was normal in size. Right Atrium: Right atrial size was normal in size. Pericardium: There is no evidence of pericardial effusion. Mitral Valve: The mitral valve is normal in structure. Trivial mitral valve regurgitation. No evidence of mitral valve stenosis. MV peak gradient, 6.2 mmHg. The mean mitral valve gradient is 2.0 mmHg. Tricuspid Valve: The tricuspid valve is normal in structure. Tricuspid valve regurgitation is trivial. Aortic Valve: The aortic valve is normal in structure. Aortic valve regurgitation is not visualized. No aortic stenosis is present. Aortic valve mean gradient measures 2.0 mmHg. Aortic valve peak gradient measures 4.8 mmHg. Aortic valve area, by VTI measures 2.57 cm. Pulmonic Valve: The pulmonic valve was normal in structure. Pulmonic valve regurgitation is not visualized. Aorta: The aortic root is normal in size and structure. IAS/Shunts: No atrial level shunt detected by color flow Doppler.  LEFT VENTRICLE PLAX 2D                        Biplane EF (MOD) LVIDd:         4.40 cm  LV Biplane EF:   Left LVIDs:         3.10 cm                          ventricular LV PW:         1.30 cm                          ejection LV IVS:        1.20 cm                          fraction by LVOT diam:     2.10 cm                          2D MOD LV SV:         39                               biplane is LV SV Index:   29                               37.8 %. LVOT Area:     3.46 cm                                Diastology                                LV e' medial:    3.15 cm/s LV Volumes (MOD)               LV  E/e' medial:  15.7 LV vol d, MOD    43.9 ml       LV e' lateral:   7.18 cm/s A2C:                           LV E/e' lateral: 6.9 LV vol d, MOD    69.1 ml A4C: LV vol s, MOD    29.3 ml A2C: LV vol s, MOD    41.7 ml A4C: LV SV MOD A2C:   14.6 ml LV SV MOD A4C:   69.1 ml LV SV MOD BP:    21.1 ml RIGHT VENTRICLE RV Basal diam:  3.05 cm RV Mid diam:    2.30 cm RV S prime:     27.50 cm/s TAPSE (M-mode): 2.0 cm LEFT ATRIUM           Index        RIGHT ATRIUM          Index LA diam:      2.60 cm 1.92 cm/m   RA Area:     8.89 cm LA Vol (A2C): 21.3 ml 15.74 ml/m  RA Volume:   17.30 ml 12.78 ml/m LA Vol (A4C): 16.0 ml 11.82 ml/m  AORTIC VALVE                    PULMONIC VALVE AV Area (Vmax):    2.63 cm     PV Vmax:       1.67 m/s AV Area (Vmean):  2.64 cm     PV Peak grad:  11.2 mmHg AV Area (VTI):     2.57 cm AV Vmax:           109.50 cm/s AV Vmean:          63.100 cm/s AV VTI:            0.152 m AV Peak Grad:      4.8 mmHg AV Mean Grad:      2.0 mmHg LVOT Vmax:         83.00 cm/s LVOT Vmean:        48.100 cm/s LVOT VTI:          0.113 m LVOT/AV VTI ratio: 0.74  AORTA Ao Root diam: 3.40 cm MITRAL VALVE                TRICUSPID VALVE MV Area (PHT): 4.31 cm     TR Peak grad:   24.8 mmHg MV Area VTI:   1.80 cm     TR Vmax:        249.00 cm/s MV Peak grad:  6.2 mmHg MV Mean grad:  2.0 mmHg     SHUNTS MV Vmax:       1.25 m/s     Systemic VTI:  0.11 m MV Vmean:      59.5 cm/s    Systemic Diam: 2.10 cm MV Decel Time: 176 msec MV E velocity: 49.30 cm/s MV A velocity: 105.00 cm/s MV E/A ratio:  0.47 Designer, multimedia signed by Clotilde Dieter Signature Date/Time: 08/31/2023/4:27:41 PM    Final    US PELVIS (TRANSABDOMINAL ONLY) Result Date: 08/31/2023 CLINICAL DATA:  68 year old female with a cystic lesion in the posterior left hemipelvis on CT 3 days ago. Further characterization. EXAM: TRANSABDOMINAL ULTRASOUND OF PELVIS TECHNIQUE: Transabdominal ultrasound examination of the pelvis was performed including  evaluation of the uterus, ovaries, adnexal regions, and pelvic cul-de-sac. COMPARISON:  CT Abdomen and Pelvis 08/28/2023. FINDINGS: Uterus Measurements: 5.0 x 1.6 x 1.9 cm = volume: 8 mL. No fibroids or other mass visualized. Endometrium Thickness: 3 mm.  No focal abnormality visualized. Right ovary No normal right ovarian parenchyma can be identified. Left ovary No normal left ovarian parenchyma can be identified. Other findings: Foley catheter decompressing the urinary bladder now (images 22, 38). Oval mildly irregularly shaped fluid collection in the left hemipelvis corresponding to the CT finding is 61 x 33 x 52 mm. Estimated volume 52 mL. This is in the region of the left adnexa. Perhaps minimal internal septation (image 40). But there are no vascular elements on color Doppler (image 28). And otherwise simple fluid density. No superimposed layering free fluid identified in the pelvis. IMPRESSION: 1. Left hemipelvis simple fluid density collection with no vascular elements measuring up to 6.1 cm, estimated volume 52 mL. This is in the vicinity of the left adnexa, although neither ovary can be identified. 2. Diminutive, normal uterus. Electronically Signed   By: Odessa Fleming M.D.   On: 08/31/2023 10:56   [place] EKG SITE RITE Result Date: 08/30/2023 If Site Rite image not attached, placement could not be confirmed due to current cardiac rhythm.  DG Chest Port 1 View Result Date: 08/29/2023 CLINICAL DATA:  Sirs EXAM: PORTABLE CHEST 1 VIEW COMPARISON:  08/24/2023 FINDINGS: Heart and mediastinal contours within normal limits. Left lower lobe retrocardiac opacity. Right lung clear. No visual effusions or acute bony abnormality. IMPRESSION: Left lower lobe retrocardiac atelectasis or infiltrate. Electronically Signed   By:  Charlett Nose M.D.   On: 08/29/2023 17:09       Time spent: 50 min     Sunnie Nielsen, DO [facility] 09/01/2023, 5:27 PM    Dictation software may have been used to generate the  above note. Typos may occur and escape review in typed/dictated notes. Please contact Dr Lyn Hollingshead directly for clarity if needed.  Staff may message me via secure chat in Epic  but this may not receive an immediate response,  please page me for urgent matters!  If 7PM-7AM, please contact night coverage [email or web address]

## 2023-09-01 NOTE — Plan of Care (Signed)
Problem: Education: Goal: Knowledge of General Education information will improve Description: Including pain rating scale, medication(s)/side effects and non-pharmacologic comfort measures Outcome: Progressing   Problem: Coping: Goal: Level of anxiety will decrease Outcome: Progressing   Problem: Elimination: Goal: Will not experience complications related to urinary retention Outcome: Progressing   Problem: Pain Managment: Goal: General experience of comfort will improve and/or be controlled Outcome: Progressing   Problem: Safety: Goal: Ability to remain free from injury will improve Outcome: Progressing   Problem: Skin Integrity: Goal: Risk for impaired skin integrity will decrease Outcome: Progressing

## 2023-09-01 NOTE — Progress Notes (Signed)
Daily Progress Note   Patient Name: Tracey Wyatt       Date: 09/01/2023 DOB: 02-01-1956  Age: 68 y.o. MRN#: [id number] Attending Physician: Sunnie Nielsen, DO Primary Care Physician: Leia Alf, MD (Inactive) Admit Date: 08/25/2023  Reason for Consultation/Follow-up: Establishing goals of care  Subjective: Notes reviewed.  TOC is working on discharge with hospice to follow.  Into see patient.  No family at bedside.  Patient is resting with eyes closed and does not open them upon speaking to her.  No distress noted at this time.  Breakfast appears untouched.  Length of Stay: 6  Current Medications: Scheduled Meds:   benztropine  1 mg Oral TID   Chlorhexidine Gluconate Cloth  6 each Topical Daily   OLANZapine  2.5 mg Intramuscular BID    Continuous Infusions:  valproate sodium 120 mg (09/01/23 0851)    PRN Meds: acetaminophen **OR** acetaminophen, antiseptic oral rinse, glycopyrrolate **OR** glycopyrrolate **OR** glycopyrrolate, haloperidol **OR** haloperidol **OR** haloperidol lactate, hydrALAZINE, LORazepam **OR** LORazepam **OR** LORazepam, LORazepam, ondansetron **OR** ondansetron (ZOFRAN) IV, oxyCODONE **OR** oxyCODONE, polyvinyl alcohol  Physical Exam Constitutional:      Comments: Eyes closed  Pulmonary:     Effort: Pulmonary effort is normal.             Vital Signs: BP 121/86 (BP Location: Left Arm)   Pulse 93   Temp 98.3 F (36.8 C)   Resp 18   Ht 5\' 6"  (1.676 m)   Wt 36.2 kg   SpO2 99%   BMI 12.88 kg/m  SpO2: SpO2: 99 % O2 Device: O2 Device: Room Air O2 Flow Rate: O2 Flow Rate (L/min): 3 L/min  Intake/output summary:  Intake/Output Summary (Last 24 hours) at 09/01/2023 1239 Last data filed at 09/01/2023 0555 Gross per 24 hour  Intake  449.35 ml  Output --  Net 449.35 ml   LBM: Last BM Date :  (unknown) Baseline Weight: Weight: 43.1 kg Most recent weight: Weight: 36.2 kg    Patient Active Problem List   Diagnosis Date Noted   Dementia (HCC) 08/26/2023   Altered mental status 08/26/2023   HTN (hypertension) 08/25/2023   Seizure (HCC) 08/25/2023   Protein calorie malnutrition (HCC) 08/25/2023   Change in mental status 08/25/2023   Screen for colon cancer 10/29/2016    Palliative Care Assessment &  Plan    Recommendations/Plan: Patient is full comfort care.   TOC is working on discharge with hospice to follow.  Code Status:    Code Status Orders  (From admission, onward)           Start     Ordered   08/31/23 1550  Do not attempt resuscitation (DNR) - Comfort care  Continuous       Question Answer Comment  If patient has no pulse and is not breathing Do Not Attempt Resuscitation   In Pre-Arrest Conditions (Patient Is Breathing and Has a Pulse) Provide comfort measures. Relieve any mechanical airway obstruction. Avoid transfer unless required for comfort.   Consent: Discussion documented in EHR or advanced directives reviewed      08/31/23 1551           Code Status History     Date Active Date Inactive Code Status Order ID Comments User Context   08/26/2023 0026 08/31/2023 1551 Limited: Do not attempt resuscitation (DNR) -DNR-LIMITED -Do Not Intubate/DNI  [id number]  Jacques Navy, MD ED   08/26/2023 0013 08/26/2023 0026 Full Code [id number]  Norins, Rosalyn Gess, MD ED       Prognosis:  < 2 weeks   Thank you for allowing the Palliative Medicine Team to assist in the care of this patient.   Morton Stall, NP  Please contact Palliative Medicine Team phone at [phone] for questions and concerns.

## 2023-09-01 NOTE — Progress Notes (Signed)
[facility] Liaison Note  Patient remains inappropriate for the Hospice Home at this time.  We are happy to re-evaluate if there are medical changes in patient's condition.    Please don't hesitate to call with any Hospice related questions or concerns.    Thank you for the opportunity to participate in this patient's care.  [facility] Liaison [phone]

## 2023-09-01 NOTE — TOC Progression Note (Signed)
Transition of Care [facility]) - Progression Note    Patient Details  Name: Tracey Wyatt MRN: [id number] Date of Birth: [birth year]  [facility]) CM/SW Contact  Allena Katz, LCSW Phone Number: 09/01/2023, 11:11 AM  Clinical Narrative:   [facility] to reassess for hospice home today.    Expected Discharge Plan: Hospice Medical Facility Barriers to Discharge: Continued Medical Work up  Expected Discharge Plan and Services     Post Acute Care Choice: Hospice Living arrangements for the past 2 months: Assisted Living Facility                                       Social Determinants of Health (SDOH) Interventions SDOH Screenings   Food Insecurity: No Food Insecurity (08/26/2023)  Housing: Low Risk  (08/26/2023)  Transportation Needs: No Transportation Needs (08/26/2023)  Utilities: Not At Risk (08/26/2023)  Social Connections: Unknown (08/26/2023)  Tobacco Use: Medium Risk (08/25/2023)    Readmission Risk Interventions     No data to display

## 2023-09-02 DIAGNOSIS — R627 Adult failure to thrive: Secondary | ICD-10-CM | POA: Diagnosis not present

## 2023-09-02 DIAGNOSIS — B9561 Methicillin susceptible Staphylococcus aureus infection as the cause of diseases classified elsewhere: Secondary | ICD-10-CM

## 2023-09-02 DIAGNOSIS — Z7189 Other specified counseling: Secondary | ICD-10-CM | POA: Diagnosis not present

## 2023-09-02 DIAGNOSIS — R7881 Bacteremia: Secondary | ICD-10-CM | POA: Diagnosis not present

## 2023-09-02 DIAGNOSIS — E43 Unspecified severe protein-calorie malnutrition: Secondary | ICD-10-CM | POA: Diagnosis not present

## 2023-09-02 DIAGNOSIS — R4182 Altered mental status, unspecified: Secondary | ICD-10-CM | POA: Diagnosis not present

## 2023-09-02 MED ORDER — CEFAZOLIN SODIUM-DEXTROSE 2-4 GM/100ML-% IV SOLN
2.0000 g | Freq: Three times a day (TID) | INTRAVENOUS | Status: DC
  Administered 2023-09-02 – 2023-09-03 (×3): 2 g via INTRAVENOUS
  Filled 2023-09-02 (×3): qty 100

## 2023-09-02 MED ORDER — ENOXAPARIN SODIUM 40 MG/0.4ML IJ SOSY
40.0000 mg | PREFILLED_SYRINGE | INTRAMUSCULAR | Status: DC
Start: 2023-09-02 — End: 2023-09-02

## 2023-09-02 MED ORDER — METOPROLOL SUCCINATE ER 25 MG PO TB24
25.0000 mg | ORAL_TABLET | Freq: Every day | ORAL | Status: DC
  Administered 2023-09-02: 25 mg via ORAL
  Filled 2023-09-02: qty 1

## 2023-09-02 MED ORDER — ENOXAPARIN SODIUM 30 MG/0.3ML IJ SOSY
30.0000 mg | PREFILLED_SYRINGE | INTRAMUSCULAR | Status: DC
  Administered 2023-09-02 – 2023-09-03 (×2): 30 mg via SUBCUTANEOUS
  Filled 2023-09-02 (×3): qty 0.3

## 2023-09-02 NOTE — Progress Notes (Addendum)
Daily Progress Note   Patient Name: Tracey Wyatt       Date: 09/02/2023 DOB: 01/16/1956  Age: 68 y.o. MRN#: [id number] Attending Physician: Lucile Shutters, MD Primary Care Physician: Leia Alf, MD (Inactive) Admit Date: 08/25/2023  Reason for Consultation/Follow-up: Establishing goals of care  Subjective: Notes reviewed.  In to see patient.  She is currently sitting up in bed with lunch at bedside.  She is alert but does not speak.  Patient's 2 brothers are present and Mike's wife Zella Ball is present on Mike's speaker phone.  She states she works in administration at a nursing home in their area.  With conversation they talk about options and paths of moving forward.  They confirm DNR/DNI, no feeding tube, no antibiotics.  They confirm that they would not want her brought back to the hospital if she declines.  They discuss as she is more alert, attempting therapy to see if she would be able to regain strength for quality of life and independence.  They are requesting therapy evaluations.  Spoke with SLP who states she has reimplemented patient's previous dysphagia diet and would recommend encouraging patient to eat more.  Family made aware.  Length of Stay: 7  Current Medications: Scheduled Meds:   benztropine  1 mg Oral TID   enoxaparin (LOVENOX) injection  30 mg Subcutaneous Q24H   metoprolol succinate  25 mg Oral Daily   OLANZapine  2.5 mg Intramuscular BID    Continuous Infusions:   ceFAZolin (ANCEF) IV     valproate sodium 120 mg (09/02/23 0952)    PRN Meds: acetaminophen **OR** acetaminophen, antiseptic oral rinse, glycopyrrolate **OR** glycopyrrolate **OR** glycopyrrolate, haloperidol **OR** haloperidol **OR** haloperidol lactate, hydrALAZINE, LORazepam **OR**  LORazepam **OR** LORazepam, LORazepam, ondansetron **OR** ondansetron (ZOFRAN) IV, oxyCODONE **OR** oxyCODONE, polyvinyl alcohol, senna-docusate  Physical Exam Pulmonary:     Effort: Pulmonary effort is normal.  Neurological:     Mental Status: She is alert.             Vital Signs: BP (!) 163/112   Pulse 94   Temp 98.4 F (36.9 C)   Resp 16   Ht 5\' 6"  (1.676 m)   Wt 36.2 kg   SpO2 99%   BMI 12.88 kg/m  SpO2: SpO2: 99 % O2 Device: O2 Device: Room Air O2 Flow  Rate: O2 Flow Rate (L/min): 3 L/min  Intake/output summary:  Intake/Output Summary (Last 24 hours) at 09/02/2023 1529 Last data filed at 09/02/2023 1416 Gross per 24 hour  Intake 0 ml  Output --  Net 0 ml   LBM: Last BM Date : 09/02/23 Baseline Weight: Weight: 43.1 kg Most recent weight: Weight: 36.2 kg    Patient Active Problem List   Diagnosis Date Noted   Failure to thrive in adult 09/02/2023   Dementia (HCC) 08/26/2023   Altered mental status 08/26/2023   HTN (hypertension) 08/25/2023   Seizure (HCC) 08/25/2023   Protein calorie malnutrition (HCC) 08/25/2023   Change in mental status 08/25/2023   Screen for colon cancer 10/29/2016    Palliative Care Assessment & Plan     Recommendations/Plan: Comfort focused care.  Family would not want patient to return to the hospital.  They would like to attempt rehab to see if she can gain some strength to improve quality of life.  Code Status:    Code Status Orders  (From admission, onward)           Start     Ordered   08/31/23 1550  Do not attempt resuscitation (DNR) - Comfort care  Continuous       Question Answer Comment  If patient has no pulse and is not breathing Do Not Attempt Resuscitation   In Pre-Arrest Conditions (Patient Is Breathing and Has a Pulse) Provide comfort measures. Relieve any mechanical airway obstruction. Avoid transfer unless required for comfort.   Consent: Discussion documented in EHR or advanced directives reviewed       08/31/23 1551           Code Status History     Date Active Date Inactive Code Status Order ID Comments User Context   08/26/2023 0026 08/31/2023 1551 Limited: Do not attempt resuscitation (DNR) -DNR-LIMITED -Do Not Intubate/DNI  [id number]  Jacques Navy, MD ED   08/26/2023 0013 08/26/2023 0026 Full Code [id number]  Norins, Rosalyn Gess, MD ED       Prognosis: Poor   Thank you for allowing the Palliative Medicine Team to assist in the care of this patient.     Morton Stall, NP  Please contact Palliative Medicine Team phone at [phone] for questions and concerns.

## 2023-09-02 NOTE — Plan of Care (Signed)
  Problem: Education: Goal: Knowledge of General Education information will improve Description: Including pain rating scale, medication(s)/side effects and non-pharmacologic comfort measures Outcome: Progressing   Problem: Clinical Measurements: Goal: Will remain free from infection Outcome: Progressing   Problem: Nutrition: Goal: Adequate nutrition will be maintained Outcome: Progressing   Problem: Coping: Goal: Level of anxiety will decrease Outcome: Progressing   Problem: Elimination: Goal: Will not experience complications related to bowel motility Outcome: Progressing Goal: Will not experience complications related to urinary retention Outcome: Progressing   Problem: Pain Managment: Goal: General experience of comfort will improve and/or be controlled Outcome: Progressing   Problem: Safety: Goal: Ability to remain free from injury will improve Outcome: Progressing

## 2023-09-02 NOTE — Plan of Care (Signed)

## 2023-09-02 NOTE — Progress Notes (Signed)
[facility] Liaison Note  Will follow patient peripherally while patient is hospitalized.  [facility] can provide Palliative Care services to patient at a SNF.       Please don't hesitate to call with any Hospice related questions or concerns.    Thank you for the opportunity to participate in this patient's care.   [facility] Liaison [phone]

## 2023-09-02 NOTE — Progress Notes (Signed)
PHARMACIST - PHYSICIAN COMMUNICATION  CONCERNING:  Enoxaparin (Lovenox) for DVT Prophylaxis    RECOMMENDATION: Patient was prescribed enoxaprin 40mg  q24 hours for VTE prophylaxis.   Filed Weights   08/26/23 0030 08/26/23 2147  Weight: 43.1 kg (95 lb) 36.2 kg (79 lb 12.9 oz)    Body mass index is 12.88 kg/m.  Estimated Creatinine Clearance: 38.5 mL/min (A) (by C-G formula based on SCr of 0.32 mg/dL (L)).   Patient is candidate for enoxaparin 30mg  every 24 hours based on Weight <45kg  DESCRIPTION: Pharmacy has adjusted enoxaparin dose per [facility] policy.  Patient is now receiving enoxaparin 30 mg every 24 hours    Gardner Candle, PharmD, BCPS Clinical Pharmacist 09/02/2023 11:38 AM

## 2023-09-02 NOTE — TOC Progression Note (Signed)
Transition of Care [facility]) - Progression Note    Patient Details  Name: Tracey Wyatt MRN: [id number] Date of Birth: July 01, 1956  [facility] CM/SW Contact  Allena Katz, LCSW Phone Number: 09/02/2023, 1:11 PM  Clinical Narrative:   CSW spoke with richard who states he would like PT/OT to see patient to get rehab and then transition to LTC. His preferences are 1. [facility] 2. [facility]. CSW will start workup once PT notes are in.    Expected Discharge Plan: Hospice Medical Facility Barriers to Discharge: Continued Medical Work up  Expected Discharge Plan and Services     Post Acute Care Choice: Hospice Living arrangements for the past 2 months: Assisted Living Facility                                       Social Determinants of Health (SDOH) Interventions SDOH Screenings   Food Insecurity: No Food Insecurity (08/26/2023)  Housing: Low Risk  (08/26/2023)  Transportation Needs: No Transportation Needs (08/26/2023)  Utilities: Not At Risk (08/26/2023)  Social Connections: Unknown (08/26/2023)  Tobacco Use: Medium Risk (08/25/2023)    Readmission Risk Interventions     No data to display

## 2023-09-02 NOTE — Progress Notes (Signed)
Date of Admission:  08/25/2023      ID: Tracey Wyatt is a 68 y.o. female  Principal Problem:   Altered mental status Active Problems:   HTN (hypertension)   Seizure (HCC)   Protein calorie malnutrition (HCC)   Change in mental status   Dementia (HCC) Tracey Wyatt is a 68 y.o. with a history of hypertension, schizophrenia, prior substance use tardive dyskinesia secondary to Haldol and risperidone which she has been off for 10 years now epilepsy, and falls, lives in a facility, with multiple presentations to the ED in the last 4 months came to the ED on 08/24/2023 brought in by EMS from the facility complaining of altered mental status.  A as per ED note they talked to his her brother and he had said that patient has been in general decline for the past 2 months.  Increasingly weak and less interactive and also been eating and drinking less.  She resides at [facility] and now has been difficulty in walking and caring for herself there.  From the ED her workup including labs CT brain UA were unrevealing so that she was sent back to the long-term care facility but she returned to the ED on 08/26/2023 with worsening symptoms especially noncommunicative So on 08/26/2023 during the return presentation she was admitted..  Vitals in the ED on 08/26/2023 was 126/88, temperature 99.1, pulse 92, respiratory rate 18 and sats of 91% Labs on done on 08/25/2023 was WBC of 5.5, Hb 10.6, hematocrit 31.5 and platelet 204. CT of the head done on 08/24/2023 was no acute intracranial abnormality.  Changes related to chronic microvascular ischemic disease.  Chest x-ray done on 08/24/2023 was normal.  She was admitted to the hospital service as change in mental status with a long psychiatric history of remote history of heavy alcohol use with progressive decline.  There was concern for frontal lobe dementia versus Lewy body dementia versus Alzheimer's disease versus CJD. EEG was ordered and it  showed mild diffuse encephalopathy.  No seizures or epileptiform discharges were seen. Neuro was consulted and they saw her on 08/28/2023 and ordered an extensive workup for dementia including B1, B3 B6 B12 MMA, folate HIV RPR TSH ammonia ESR CRP vitamin D, ANA, SPEP IFE, started on 100 mg IV thiamine.  They ordered CT chest abdomen pelvis to evaluate for occult malignancy.  They were concerned about medication related cause and and recommended that the benztropine dose be reduced to 1 mg 3 times daily rather than 2 mg.  And also consider reducing Depakote dose. Blood culture was sent on 08/29/2023 because of uptake in WBC to 23 on 08/29/2023.  The cultures come back positive for Staph aureus and I am seeing the patient for the same. Patient had been started on cefazolin. CT abdomen and pelvis showed  pelvic cyst of 5 cm and  splenic cyst On Monday she was made comfort care Now that has been rescinded and I am seeing her again   Subjective: Pt is awake   Medications:   benztropine  1 mg Oral TID   enoxaparin (LOVENOX) injection  30 mg Subcutaneous Q24H   metoprolol succinate  25 mg Oral Daily   OLANZapine  2.5 mg Intramuscular BID    Objective: Vital signs in last 24 hours: Patient Vitals for the past 24 hrs:  BP Temp Pulse Resp SpO2  09/02/23 0734 (!) 163/112 98.4 F (36.9 C) 94 -- --  09/01/23 1948 (!) 141/79  98.1 F (36.7 C) -- 16 99 %       PHYSICAL EXAM:  General: Awkae, more alert  because of tardive dyskineisa of tongue and face muscles unable to comprehend her speech  Lungs: b/l air entry Heart: s1s2 Abdomen: Soft, non-tender,not distended. Bowel sounds normal. No masses Extremities: atraumatic, no cyanosis. No edema. No clubbing Skin: No rashes or lesions. Or bruising Lymph: Cervical, supraclavicular normal. Neurologic: moves all limbs  Lab Results    Latest Ref Rng & Units 08/31/2023    6:23 AM 08/30/2023    9:17 AM 08/29/2023   11:43 AM  CBC  WBC 4.0 - 10.5 K/uL 18.4   27.3  23.0   Hemoglobin 12.0 - 15.0 g/dL 16.1  09.6  04.5   Hematocrit 36.0 - 46.0 % 29.1  28.7  29.6   Platelets 150 - 400 K/uL 265  247  273        Latest Ref Rng & Units 08/31/2023    6:23 AM 08/30/2023    9:17 AM 08/29/2023    5:49 PM  CMP  Glucose 70 - 99 mg/dL 86  99    BUN 8 - 23 mg/dL 7  9    Creatinine 4.09 - 1.00 mg/dL 8.11  <9.14  7.82   Sodium 135 - 145 mmol/L 133  131    Potassium 3.5 - 5.1 mmol/L 2.5  2.5    Chloride 98 - 111 mmol/L 100  97    CO2 22 - 32 mmol/L 24  22    Calcium 8.9 - 10.3 mg/dL 7.4  6.9    Total Protein 6.5 - 8.1 g/dL  4.7    Total Bilirubin 0.0 - 1.2 mg/dL  0.5    Alkaline Phos 38 - 126 U/L  67    AST 15 - 41 U/L  20    ALT 0 - 44 U/L  14    Microbiology:  08/29/23 BC MSSA 2/10 BC- NG 2/9 UC- 2 organisms-   Studies/Results: ECHOCARDIOGRAM COMPLETE Result Date: 08/31/2023    ECHOCARDIOGRAM REPORT   Patient Name:   Tracey Wyatt Date of Exam: 08/31/2023 Medical Rec #:  [id number]         Height:       66.0 in Accession #:    [id number]        Weight:       79.8 lb Date of Birth:  05/12/1956         BSA:          1.353 m Patient Age:    68 years          BP:           123/83 mmHg Patient Gender: F                 HR:           97 bpm. Exam Location:  [facility] Procedure: 2D Echo, Cardiac Doppler and Color Doppler Indications:     Bacteremia  History:         Patient has no prior history of Echocardiogram examinations.                  Signs/Symptoms:Bacteremia and Altered Mental Status; Risk                  Factors:Hypertension.  Sonographer:     Mikki Harbor Referring Phys:  [id number] Lynn Ito Diagnosing Phys: Rozell Searing Custovic IMPRESSIONS  1. Left ventricular ejection fraction, by estimation,  is 35 to 40%. Left ventricular ejection fraction by 2D MOD biplane is 37.8 %. The left ventricle has moderately decreased function. The left ventricle demonstrates regional wall motion abnormalities (see scoring diagram/findings for description). Left  ventricular diastolic parameters are consistent with Grade I diastolic dysfunction (impaired relaxation).  2. Right ventricular systolic function is normal. The right ventricular size is normal. There is normal pulmonary artery systolic pressure. The estimated right ventricular systolic pressure is 27.8 mmHg.  3. The mitral valve is normal in structure. Trivial mitral valve regurgitation. No evidence of mitral stenosis.  4. The aortic valve is normal in structure. Aortic valve regurgitation is not visualized. No aortic stenosis is present. FINDINGS  Left Ventricle: Left ventricular ejection fraction, by estimation, is 35 to 40%. Left ventricular ejection fraction by 2D MOD biplane is 37.8 %. The left ventricle has moderately decreased function. The left ventricle demonstrates regional wall motion abnormalities. The left ventricular internal cavity size was normal in size. There is no left ventricular hypertrophy. Left ventricular diastolic parameters are consistent with Grade I diastolic dysfunction (impaired relaxation).  LV Wall Scoring: The mid and distal anterior septum, mid inferoseptal segment, and apex are hypokinetic. Right Ventricle: The right ventricular size is normal. No increase in right ventricular wall thickness. Right ventricular systolic function is normal. There is normal pulmonary artery systolic pressure. The tricuspid regurgitant velocity is 2.49 m/s, and  with an assumed right atrial pressure of 3 mmHg, the estimated right ventricular systolic pressure is 27.8 mmHg. Left Atrium: Left atrial size was normal in size. Right Atrium: Right atrial size was normal in size. Pericardium: There is no evidence of pericardial effusion. Mitral Valve: The mitral valve is normal in structure. Trivial mitral valve regurgitation. No evidence of mitral valve stenosis. MV peak gradient, 6.2 mmHg. The mean mitral valve gradient is 2.0 mmHg. Tricuspid Valve: The tricuspid valve is normal in structure. Tricuspid  valve regurgitation is trivial. Aortic Valve: The aortic valve is normal in structure. Aortic valve regurgitation is not visualized. No aortic stenosis is present. Aortic valve mean gradient measures 2.0 mmHg. Aortic valve peak gradient measures 4.8 mmHg. Aortic valve area, by VTI measures 2.57 cm. Pulmonic Valve: The pulmonic valve was normal in structure. Pulmonic valve regurgitation is not visualized. Aorta: The aortic root is normal in size and structure. IAS/Shunts: No atrial level shunt detected by color flow Doppler.  LEFT VENTRICLE PLAX 2D                        Biplane EF (MOD) LVIDd:         4.40 cm         LV Biplane EF:   Left LVIDs:         3.10 cm                          ventricular LV PW:         1.30 cm                          ejection LV IVS:        1.20 cm                          fraction by LVOT diam:     2.10 cm  2D MOD LV SV:         39                               biplane is LV SV Index:   29                               37.8 %. LVOT Area:     3.46 cm                                Diastology                                LV e' medial:    3.15 cm/s LV Volumes (MOD)               LV E/e' medial:  15.7 LV vol d, MOD    43.9 ml       LV e' lateral:   7.18 cm/s A2C:                           LV E/e' lateral: 6.9 LV vol d, MOD    69.1 ml A4C: LV vol s, MOD    29.3 ml A2C: LV vol s, MOD    41.7 ml A4C: LV SV MOD A2C:   14.6 ml LV SV MOD A4C:   69.1 ml LV SV MOD BP:    21.1 ml RIGHT VENTRICLE RV Basal diam:  3.05 cm RV Mid diam:    2.30 cm RV S prime:     27.50 cm/s TAPSE (M-mode): 2.0 cm LEFT ATRIUM           Index        RIGHT ATRIUM          Index LA diam:      2.60 cm 1.92 cm/m   RA Area:     8.89 cm LA Vol (A2C): 21.3 ml 15.74 ml/m  RA Volume:   17.30 ml 12.78 ml/m LA Vol (A4C): 16.0 ml 11.82 ml/m  AORTIC VALVE                    PULMONIC VALVE AV Area (Vmax):    2.63 cm     PV Vmax:       1.67 m/s AV Area (Vmean):   2.64 cm     PV Peak grad:  11.2 mmHg AV  Area (VTI):     2.57 cm AV Vmax:           109.50 cm/s AV Vmean:          63.100 cm/s AV VTI:            0.152 m AV Peak Grad:      4.8 mmHg AV Mean Grad:      2.0 mmHg LVOT Vmax:         83.00 cm/s LVOT Vmean:        48.100 cm/s LVOT VTI:          0.113 m LVOT/AV VTI ratio: 0.74  AORTA Ao Root diam: 3.40 cm MITRAL VALVE                TRICUSPID VALVE MV Area (  PHT): 4.31 cm     TR Peak grad:   24.8 mmHg MV Area VTI:   1.80 cm     TR Vmax:        249.00 cm/s MV Peak grad:  6.2 mmHg MV Mean grad:  2.0 mmHg     SHUNTS MV Vmax:       1.25 m/s     Systemic VTI:  0.11 m MV Vmean:      59.5 cm/s    Systemic Diam: 2.10 cm MV Decel Time: 176 msec MV E velocity: 49.30 cm/s MV A velocity: 105.00 cm/s MV E/A ratio:  0.47 Designer, multimedia signed by Clotilde Dieter Signature Date/Time: 08/31/2023/4:27:41 PM    Final      Assessment/Plan: Staph aureus bacteremia -high  bioburden 4/4 with time to positivity less than 12 hours.  The cultures were sent on 08/29/2023 and no blood culture was done on admissions.  So not sure whether this was present even before admission .unclear source Patient was started  on cefazolin, continue 2 grms IV q8. And it was DC on Monday as she was made comfort care- now comfort care has been rescinded and cefazolin has been restarted Patient does not have any pacemaker or valve replacement No hardware Has a pelvic cyst of 5 cm ?  If family wants to pursue aggressive management she will need Aspiration  IR of Pelvic cyst and TEE If not then will have to discuss with them regarding IV VS PO antibiotic Has a splenic cyst    General decline in health  the past couple of months Poor appetite, not eating Weight loss over 2 years Neurocognitive impairment.   History of seizures Schizophrenia with medication induced tardive dyskinesia   CT abdomen shows left posterior pelvic cystic collection of 5 cm Causing some compression on the rectum Recommend IR drain this and send it  for culture CT abdomen showed splenic cyst of 2 cm   Protein calorie malnutrition with hypoalbuminemia Edema ankles Hypokalemia   HIV/RPR nonreactive  Discussed the management with Dr.Agbata

## 2023-09-02 NOTE — Progress Notes (Signed)
Progress Note   Patient: Tracey Wyatt:[id number] DOB: 03/06/1956 DOA: 08/25/2023     7 DOS: the patient was seen and examined on 09/02/2023   Brief hospital course:  HPI: Tracey Wyatt, a 68 y/o with h/o HTN, tardive dyskinesia, vague history of seizure disorder, psychiatric disorders - depression, schizophrenia, pyromania. She took haldol for many years and developed tardive diskinesia but has been off haldol and risperidone for >10 years. She has resided in a long term care facility for 20 years. . Over the past year she has slowly declined: losing weight, hair turned gray, she had behavior problems. For  two months she has had a progressive decline and over the past several weeks has become withdrawn and less communicative, decreased PO intake, worsening weight loss, and progressive decline in ability to manage ADLs and ambulate. Her brother is the primary historian and has been involved with her care for years.    Detailed neurology consult note 02/07 for other hisory:   "While she was treated with Haldol and risperidone for many years they reports she has been off of any of those medications for at least 1 or 2 decades.  Unfortunately she has had progressive decline in her cognitive abilities as well as weight loss for the past year despite eating well   Due to her weight loss they were encouraging her to eat more and noted that she was still eating well 3 weeks ago although she was more slouched over fatigued appearing.  Now for the past 3 weeks she has hardly been able to eat and not walking well   Additionally they note that 6 - 8 weeks ago she was noted to be more aggressive with the staff hitting etc. and thinks some medication changes may have been made at the time, but they are unclear on details"   She was seen in [facility] 08/24/23 for these problems. Her workup including labs, CT brain, U/A were unrevealing. She was returned to her long term care facility. She now returns to [facility]  with worsening symptoms, essentially non-communicative.     02/04: to ED 02/05: admitted to hospitalist service, unable to get EEG until tomorrow. Nonverbal, somnolent.  02/06: More awake today but disoriented, slow/difficult speech. SLP, PT, OT to see. EEG technically difficult study suggestive of mild diffuse encephalopathy. Neuro consult ordered. Qualifies for SNF rehab but concern for po intake 02/07: awake and interactive today, not eating much. Neuro recs as below - w/u movement d/o for reversible causes, reviewing meds for possible reversible causes. See A/P for full discussion - if feeding tube would provide nutrition while treatment takes time to help her chorea, then this would be reasonable but if the condition is expected to stay stable or deteriorate family would not want feeding tube and will consider comfort measures 02/08: more somnolent today and not eating. Palliative to see - see consult note. WBC up, infectious w/u and abx.  02/09: more alert this morning, says she's hungry, making jokes. NG attempted but pt did not tolerate bedside placement. Continue abx  02/10: presented options but it is complicated, feeding tube has risks and would not likely address underlying frailty given how low BMI has gotten, but could try to address nutrition and see if this helps / buys time to reassess QOL. Option for G-tube placement under anesthesia and would consider MRI and address pelvic cyst as well while under anesthesia which would require detailed coordination but is feasible, is also a lot to put the  patient through. Family given time to consider. Family meeting was initially planned but decision made prior to that to transition to comfort measures, d/c aggressive workup and treatment, in line with patient/family goals  02/11: remains somnolent, stable, not eating. Hospice reassess - not candidate for hospice but we don't have a good option for placement at home or other facility, expect will  decompensate fairly quickly given frailty, sepsis.  02/12: More awake and alert this morning.  Responsive.  Brother at the bedside.  Family will contact discharge to skilled nursing facility instead.     Consultants:  Neurology  Palliative care Infectious disease Interventional Radiology General surgery    Procedures/Surgeries: none    Assessment and Plan:  Change in mental status /  Metabolic Encephalopathy  Progressive movement disorder working dx is tardive dyskinesia  Questionable seizure history  Improvement in patient's mental but she continues to have tremors    SIRS/Sepsis Bacteremia - (+)BCx Staph Aureus  Urine culture yields Enterococcus faecalis and Staphylococcus simulans, ID pending    Protein calorie malnutrition  Weight loss Appreciate dietitian input Continue dysphagia 1 diet with nectar thick liquids Reconsult speech therapy    Cystic pelvic mass 3.8 x 5.4 cm, favored benign and noninfectious per radiology s/p pelvic US Laxative and/or Stool softeners as needed    HTN (hypertension) Continue metoprolol   Hiatal Hernia, large Stable   Hypokalemia Supplemented   Advanced care planning Pt disoriented, does not have capacity to make decisions  Palliative care and I have discussed w/ family, see note D/c meds and other workup Comfort measures  02/12.  Patient seen and examined at the bedside.  Awake and alert this morning. Family at the bedside and have requested PT and speech therapy consult, they want patient discharged to a skilled nursing facility and if she has progressive decline they would reconsider hospice again.     underweight based on BMI: Body mass index is 15.33 kg/m.  Underweight - under 18  overweight - 25 to 29 obese - 30 or more Class 1 obesity: BMI of 30.0 to 34 Class 2 obesity: BMI of 35.0 to 39 Class 3 obesity: BMI of 40.0 to 49 Super Morbid Obesity: BMI 50-59 Super-super Morbid Obesity: BMI 60+ Significantly low or  high BMI is associated with higher medical risk.  Weight management advised as adjunct to other disease management and risk reduction treatments              Subjective: Sitting up in bed. Eating breakfast. Brother at the bedside  Physical Exam: Vitals:   08/31/23 1711 08/31/23 2045 09/01/23 1948 09/02/23 0734  BP: 108/75 121/86 (!) 141/79 (!) 163/112  Pulse: (!) 115 93  94  Resp: 18  16   Temp:  98.3 F (36.8 C) 98.1 F (36.7 C) 98.4 F (36.9 C)  TempSrc:      SpO2: 97% 99% 99%   Weight:      Height:       Physical Exam Constitutional:      General: She is not in acute distress.    Appearance: She is not toxic-appearing.     Comments: frail, generalized tremors Cardiovascular:     Rate and Rhythm: Normal rate and regular rhythm.  Pulmonary:     Effort: Pulmonary effort is normal.  Abdominal:     General: Abdomen is flat.     Palpations: Abdomen is soft.  Skin:    General: Skin is dry.  Neurological:     Mental Status:  She is awake.      Data Reviewed: No new labs There are no new results to review at this time.  Family Communication: Plan of care discussed with patient's brother and her SIL over the phone. All questions and concerns have been addressed. They verbalize understanding and agree with the plan  Disposition: Status is: Inpatient Remains inpatient appropriate because: Discharge planning  Planned Discharge Destination: Skilled nursing facility    Time spent: 38  minutes  Author: Lucile Shutters, MD 09/02/2023 11:29 AM  For on call review [email or web address].

## 2023-09-02 NOTE — Progress Notes (Addendum)
SLP Cancellation Note  Patient Details Name: Tracey Wyatt MRN: [id number] DOB: [birth year]   Cancelled treatment:       Reason Eval/Treat Not Completed:  (chart reviewed; consulted Palliative Care services re: pt's current status and situation.)  Per NSG and Palliative Care discussion today(post a repeat ST BSE order being placed again), pt continues Full Comfort Care status per active chart Orders. Pt's family is seeking LTC through [facility] at SNF. The initial ST BSE order was discontinued by MD when Family transitioned pt to Full Comfort Care status. The Family has chosen to continue w/ the Dysphagia diet suggested by ST services during assessment -- pt was deemed to be at high risk for aspiration w/ oral intake by ST. Family has had questions re: offering pt "thin liquids" per NSG discussion.  Recommended Palliative Care have further discussion w/ Family any further choices of offering pt Pleasure po's; risk for aspiration/aspiration pneumonia -- pt's overall GOC/QOL choices. Palliative Care NP agreed and followed up w/ Family, pt. Noted Hospice Liaison is engaged w/ Family, pt also. No further skilled ST services indicated currently.       Jerilynn Som, MS, CCC-SLP Speech Language Pathologist Rehab Services; [facility] [phone] ([phone]) Keri Veale 09/02/2023, 4:05 PM

## 2023-09-03 DIAGNOSIS — R410 Disorientation, unspecified: Secondary | ICD-10-CM | POA: Diagnosis not present

## 2023-09-03 DIAGNOSIS — Z7189 Other specified counseling: Secondary | ICD-10-CM | POA: Diagnosis not present

## 2023-09-03 NOTE — Progress Notes (Signed)
Progress Note   Patient: Tracey Wyatt:[id number] DOB: 07/29/1955 DOA: 08/25/2023     8 DOS: the patient was seen and examined on 09/03/2023   Brief hospital course:  [name], a 68 y/o with h/o HTN, tardive dyskinesia, vague history of seizure disorder, psychiatric disorders - depression, schizophrenia, pyromania. She took haldol for many years and developed tardive diskinesia but has been off haldol and risperidone for >10 years. She has resided in a long term care facility for 20 years. . Over the past year she has slowly declined: losing weight, hair turned gray, she had behavior problems. For  two months she has had a progressive decline and over the past several weeks has become withdrawn and less communicative, decreased PO intake, worsening weight loss, and progressive decline in ability to manage ADLs and ambulate. Her brother is the primary historian and has been involved with her care for years.    Detailed neurology consult note 02/07 for other hisory:   "While she was treated with Haldol and risperidone for many years they reports she has been off of any of those medications for at least 1 or 2 decades.  Unfortunately she has had progressive decline in her cognitive abilities as well as weight loss for the past year despite eating well   Due to her weight loss they were encouraging her to eat more and noted that she was still eating well 3 weeks ago although she was more slouched over fatigued appearing.  Now for the past 3 weeks she has hardly been able to eat and not walking well   Additionally they note that 6 - 8 weeks ago she was noted to be more aggressive with the staff hitting etc. and thinks some medication changes may have been made at the time, but they are unclear on details"   She was seen in [facility] 08/24/23 for these problems. Her workup including labs, CT brain, U/A were unrevealing. She was returned to her long term care facility. She now returns to [facility] with  worsening symptoms, essentially non-communicative.     02/04: to ED 02/05: admitted to hospitalist service, unable to get EEG until tomorrow. Nonverbal, somnolent.  02/06: More awake today but disoriented, slow/difficult speech. SLP, PT, OT to see. EEG technically difficult study suggestive of mild diffuse encephalopathy. Neuro consult ordered. Qualifies for SNF rehab but concern for po intake 02/07: awake and interactive today, not eating much. Neuro recs as below - w/u movement d/o for reversible causes, reviewing meds for possible reversible causes. See A/P for full discussion - if feeding tube would provide nutrition while treatment takes time to help her chorea, then this would be reasonable but if the condition is expected to stay stable or deteriorate family would not want feeding tube and will consider comfort measures 02/08: more somnolent today and not eating. Palliative to see - see consult note. WBC up, infectious w/u and abx.  02/09: more alert this morning, says she's hungry, making jokes. NG attempted but pt did not tolerate bedside placement. Continue abx  02/10: presented options but it is complicated, feeding tube has risks and would not likely address underlying frailty given how low BMI has gotten, but could try to address nutrition and see if this helps / buys time to reassess QOL. Option for G-tube placement under anesthesia and would consider MRI and address pelvic cyst as well while under anesthesia which would require detailed coordination but is feasible, is also a lot to put the patient  through. Family given time to consider. Family meeting was initially planned but decision made prior to that to transition to comfort measures, d/c aggressive workup and treatment, in line with patient/family goals  02/11: remains somnolent, stable, not eating. Hospice reassess - not candidate for hospice but we don't have a good option for placement at home or other facility, expect will  decompensate fairly quickly given frailty, sepsis.  02/12: More awake and alert this morning.  Responsive.  Brother at the bedside.  Family will contact discharge to skilled nursing facility instead. 02/13: Lethargic.  Had received Ativan overnight for agitation.  Brother at the bedside.  Clarified with brother and sister-in-law over the phone and they state that they do not want any antibiotics and no aggressive interventions.  They want patient to be assessed by PT to see if she has any rehab potential for placement in a skilled nursing facility.     Consultants:  Neurology  Palliative care Infectious disease Interventional Radiology General surgery    Procedures/Surgeries: none     Assessment and Plan:  Change in mental status /  Metabolic Encephalopathy  Progressive movement disorder working dx is tardive dyskinesia  Questionable seizure history  Lethargic this morning and had received Ativan     SIRS/Sepsis Bacteremia - (+)BCx Staph Aureus  Urine culture yields Enterococcus faecalis and Staphylococcus simulans Antibiotics discontinued per family request     Protein calorie malnutrition  Weight loss Appreciate dietitian input Continue dysphagia 1 diet with nectar thick liquids Appreciate speech therapy input     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  Patient seen and examined at the bedside.  Awake and alert this morning. Family at the bedside and have requested PT and speech therapy consult, they want patient discharged to a skilled nursing facility and if she has  progressive decline they would reconsider hospice again. 02/13.  Patient seen by physical therapy and has rehab potential.  For possible discharge to SNF once bed becomes available     underweight based on BMI: Body mass index is 15.33 kg/m.  Underweight - under 18  overweight - 25 to 29 obese - 30 or more Class 1 obesity: BMI of 30.0 to 34 Class 2 obesity: BMI of 35.0 to 39 Class 3 obesity: BMI of 40.0 to 49 Super Morbid Obesity: BMI 50-59 Super-super Morbid Obesity: BMI 60+ Significantly low or high BMI is associated with higher medical risk.  Weight management advised as adjunct to other disease management and risk reduction treatments Continue dysphagia diet and nutritional supplements            Subjective: Lethargic but opens eyes to loud verbal stimuli  Physical Exam: Vitals:   09/01/23 1948 09/02/23 0734 09/02/23 1957 09/03/23 0820  BP: (!) 141/79 (!) 163/112 (!) 143/81 97/64  Pulse:  94 91 (!) 126  Resp: 16  18 19   Temp: 98.1 F (36.7 C) 98.4 F (36.9 C) 99.5 F (37.5 C) 97.7 F (36.5 C)  TempSrc:    Axillary  SpO2: 99%  95% 95%  Weight:      Height:  Constitutional:      General: She is not in acute distress.  Lethargic    Appearance: She is not toxic-appearing.     Comments: frail,  Cardiovascular:     Rate and Rhythm: Normal rate and regular rhythm.  Pulmonary:     Effort: Pulmonary effort is normal.  Abdominal:     General: Abdomen is flat.     Palpations: Abdomen is soft.  Skin:    General: Skin is dry.  Neurological:     Mental Status: Lethargic but opens eyes to loud stimuli    Data Reviewed:  There are no new results to review at this time.  Family Communication: Plan of care discussed with patient's brother at the bedside, Richard over the phone as well as her sister-in-law.  They are all in agreement for patient to be discharged to a skilled nursing facility and if patient has further decline they will transition to  hospice  Disposition: Status is: Inpatient Remains inpatient appropriate because: Discharge disposition  Planned Discharge Destination: Skilled nursing facility    Time spent: 35 minutes  Author: Lucile Shutters, MD 09/03/2023 11:32 AM  For on call review [email or web address].

## 2023-09-03 NOTE — [place] FL2 (Signed)
[place] MEDICAID FL2 LEVEL OF CARE FORM     IDENTIFICATION  Patient Name: Tracey Wyatt Birthdate: [birth year] Sex: female Admission Date (Current Location): 08/25/2023  [facility] and [place] Number:  Chiropodist and Address:  [facility], [address] [place], [place] [address]      Provider Number: [id number]  Attending Physician Name and Address:  Lucile Shutters, MD  Relative Name and Phone Number:  Fabiola Backer (Brother)  [phone]    Current Level of Care: Hospital Recommended Level of Care: Skilled Nursing Facility Prior Approval Number:    Date Approved/Denied:   PASRR Number: [id number]  Discharge Plan: SNF    Current Diagnoses: Patient Active Problem List   Diagnosis Date Noted   Failure to thrive in adult 09/02/2023   MSSA bacteremia 09/02/2023   Dementia (HCC) 08/26/2023   Altered mental status 08/26/2023   HTN (hypertension) 08/25/2023   Seizure (HCC) 08/25/2023   Protein calorie malnutrition (HCC) 08/25/2023   Change in mental status 08/25/2023   Screen for colon cancer 10/29/2016    Orientation RESPIRATION BLADDER Height & Weight        Normal Incontinent Weight: 79 lb 12.9 oz (36.2 kg) Height:  5\' 6"  (167.6 cm)  BEHAVIORAL SYMPTOMS/MOOD NEUROLOGICAL BOWEL NUTRITION STATUS      Continent    AMBULATORY STATUS COMMUNICATION OF NEEDS Skin   Extensive Assist Verbally Normal                       Personal Care Assistance Level of Assistance  Feeding, Dressing, Bathing Bathing Assistance: Maximum assistance Feeding assistance: Maximum assistance Dressing Assistance: Maximum assistance     Functional Limitations Info  Sight Sight Info: Impaired        SPECIAL CARE FACTORS FREQUENCY  PT (By licensed PT), OT (By licensed OT)     PT Frequency: 5 times a week OT Frequency: 5 times a week            Contractures Contractures Info: Not present    Additional Factors Info  Code  Status, Allergies, Isolation Precautions Code Status Info: DNR Allergies Info: : A+d Diaper Rash (Diaper Rash Products), Morphine, Procaine     Isolation Precautions Info: Contact     Current Medications (09/03/2023):  This is the current hospital active medication list Current Facility-Administered Medications  Medication Dose Route Frequency Provider Last Rate Last Admin   acetaminophen (TYLENOL) tablet 650 mg  650 mg Oral Q6H PRN Morton Stall, NP       Or   acetaminophen (TYLENOL) suppository 650 mg  650 mg Rectal Q6H PRN Morton Stall, NP       antiseptic oral rinse (BIOTENE) solution 15 mL  15 mL Topical PRN Morton Stall, NP       benztropine (COGENTIN) tablet 1 mg  1 mg Oral TID Bhagat, Srishti L, MD   1 mg at 09/02/23 2215   enoxaparin (LOVENOX) injection 30 mg  30 mg Subcutaneous Q24H Agbata, Tochukwu, MD   30 mg at 09/02/23 1236   glycopyrrolate (ROBINUL) tablet 1 mg  1 mg Oral Q4H PRN Morton Stall, NP       Or   glycopyrrolate (ROBINUL) injection 0.2 mg  0.2 mg Subcutaneous Q4H PRN Morton Stall, NP       Or   glycopyrrolate (ROBINUL) injection 0.2 mg  0.2 mg Intravenous Q4H PRN Valentina Lucks, Crystal, NP       haloperidol (HALDOL) tablet 0.5 mg  0.5 mg Oral  Q4H PRN Morton Stall, NP       Or   haloperidol (HALDOL) 2 MG/ML solution 0.5 mg  0.5 mg Sublingual Q4H PRN Morton Stall, NP       Or   haloperidol lactate (HALDOL) injection 0.5 mg  0.5 mg Intravenous Q4H PRN Morton Stall, NP       hydrALAZINE (APRESOLINE) injection 2 mg  2 mg Intravenous Q6H PRN Norins, Rosalyn Gess, MD       LORazepam (ATIVAN) tablet 1 mg  1 mg Oral Q4H PRN Morton Stall, NP       Or   LORazepam (ATIVAN) 2 MG/ML concentrated solution 1 mg  1 mg Sublingual Q4H PRN Morton Stall, NP       Or   LORazepam (ATIVAN) injection 1 mg  1 mg Intravenous Q4H PRN Morton Stall, NP       LORazepam (ATIVAN) injection 0.5 mg  0.5 mg Intravenous Q4H PRN Norins, Rosalyn Gess, MD   0.5 mg at  09/03/23 0226   metoprolol succinate (TOPROL-XL) 24 hr tablet 25 mg  25 mg Oral Daily Agbata, Tochukwu, MD   25 mg at 09/02/23 1236   OLANZapine (ZYPREXA) injection 2.5 mg  2.5 mg Intramuscular BID Rejeana Brock, MD   2.5 mg at 09/03/23 1009   ondansetron (ZOFRAN-ODT) disintegrating tablet 4 mg  4 mg Oral Q6H PRN Morton Stall, NP       Or   ondansetron (ZOFRAN) injection 4 mg  4 mg Intravenous Q6H PRN Morton Stall, NP       oxyCODONE (ROXICODONE INTENSOL) 20 MG/ML concentrated solution 5 mg  5 mg Oral Q2H PRN Morton Stall, NP       Or   oxyCODONE (ROXICODONE INTENSOL) 20 MG/ML concentrated solution 5 mg  5 mg Sublingual Q2H PRN Morton Stall, NP       polyvinyl alcohol (LIQUIFILM TEARS) 1.4 % ophthalmic solution 1 drop  1 drop Both Eyes QID PRN Morton Stall, NP       senna-docusate (Senokot-S) tablet 2 tablet  2 tablet Oral QHS PRN Sunnie Nielsen, DO       valproate (DEPACON) 120 mg in dextrose 5 % 50 mL IVPB  120 mg Intravenous Q8H Sunnie Nielsen, DO 51.2 mL/hr at 09/03/23 0519 120 mg at 09/03/23 4098     Discharge Medications: Please see discharge summary for a list of discharge medications.  Relevant Imaging Results:  Relevant Lab Results:   Additional Information [id number]  Allena Katz, LCSW

## 2023-09-03 NOTE — Progress Notes (Addendum)
Daily Progress Note   Patient Name: Tracey Wyatt       Date: 09/03/2023 DOB: [birth year]  Age: 68 y.o. MRN#: [id number] Attending Physician: Lucile Shutters, MD Primary Care Physician: Leia Alf, MD (Inactive) Admit Date: 08/25/2023  Reason for Consultation/Follow-up: Establishing goals of care  Subjective: Notes reviewed.  Patient has been assessed by physical therapy and plans are in place to discharge to SNF rehab.  Transition to hospice care if patient declines.   Recommend [facility] to follow for outpatient palliative.  Length of Stay: 8  Current Medications: Scheduled Meds:   benztropine  1 mg Oral TID   enoxaparin (LOVENOX) injection  30 mg Subcutaneous Q24H   metoprolol succinate  25 mg Oral Daily   OLANZapine  2.5 mg Intramuscular BID    Continuous Infusions:  valproate sodium 120 mg (09/03/23 1200)    PRN Meds: acetaminophen **OR** acetaminophen, antiseptic oral rinse, glycopyrrolate **OR** glycopyrrolate **OR** glycopyrrolate, haloperidol **OR** haloperidol **OR** haloperidol lactate, hydrALAZINE, LORazepam **OR** LORazepam **OR** LORazepam, LORazepam, ondansetron **OR** ondansetron (ZOFRAN) IV, oxyCODONE **OR** oxyCODONE, polyvinyl alcohol, senna-docusate  Physical Exam Pulmonary:     Effort: Pulmonary effort is normal.  Neurological:     Mental Status: She is alert.             Vital Signs: BP 97/64 (BP Location: Right Arm)   Pulse (!) 126   Temp 97.7 F (36.5 C) (Axillary)   Resp 19   Ht 5\' 6"  (1.676 m)   Wt 36.2 kg   SpO2 95%   BMI 12.88 kg/m  SpO2: SpO2: 95 % O2 Device: O2 Device: Room Air O2 Flow Rate: O2 Flow Rate (L/min): 3 L/min  Intake/output summary:  Intake/Output Summary (Last 24 hours) at 09/03/2023 1215 Last data filed  at 09/03/2023 1100 Gross per 24 hour  Intake 176.4 ml  Output 775 ml  Net -598.6 ml   LBM: Last BM Date : 09/03/23 Baseline Weight: Weight: 43.1 kg Most recent weight: Weight: 36.2 kg   Patient Active Problem List   Diagnosis Date Noted   Failure to thrive in adult 09/02/2023   MSSA bacteremia 09/02/2023   Dementia (HCC) 08/26/2023   Altered mental status 08/26/2023   HTN (hypertension) 08/25/2023   Seizure (HCC) 08/25/2023   Protein calorie malnutrition (HCC) 08/25/2023   Change in mental status  08/25/2023   Screen for colon cancer 10/29/2016    Palliative Care Assessment & Plan   Recommendations/Plan: TOC working on discharge to SNF.     Recommend [facility] to follow for outpatient palliative.Transition to hospice care when family is ready.   Code Status:    Code Status Orders  (From admission, onward)           Start     Ordered   08/31/23 1550  Do not attempt resuscitation (DNR) - Comfort care  Continuous       Question Answer Comment  If patient has no pulse and is not breathing Do Not Attempt Resuscitation   In Pre-Arrest Conditions (Patient Is Breathing and Has a Pulse) Provide comfort measures. Relieve any mechanical airway obstruction. Avoid transfer unless required for comfort.   Consent: Discussion documented in EHR or advanced directives reviewed      08/31/23 1551           Code Status History     Date Active Date Inactive Code Status Order ID Comments User Context   08/26/2023 0026 08/31/2023 1551 Limited: Do not attempt resuscitation (DNR) -DNR-LIMITED -Do Not Intubate/DNI  [id number]  Jacques Navy, MD ED   08/26/2023 0013 08/26/2023 0026 Full Code [id number]  Norins, Rosalyn Gess, MD ED       Prognosis: Poor overall    Care plan was discussed with attending  Thank you for allowing the Palliative Medicine Team to assist in the care of this patient.   Morton Stall, NP  Please contact Palliative Medicine Team phone at [phone] for  questions and concerns.

## 2023-09-03 NOTE — TOC Progression Note (Signed)
Transition of Care [facility]) - Progression Note    Patient Details  Name: Tracey Wyatt MRN: [id number] Date of Birth: [birth year]  Transition of Care [facility] CM/SW Contact  Allena Katz, LCSW Phone Number: 09/03/2023, 11:11 AM  Clinical Narrative: Referrals sent out for rehab.      Expected Discharge Plan: Hospice Medical Facility Barriers to Discharge: Continued Medical Work up  Expected Discharge Plan and Services     Post Acute Care Choice: Hospice Living arrangements for the past 2 months: Assisted Living Facility                                       Social Determinants of Health (SDOH) Interventions SDOH Screenings   Food Insecurity: No Food Insecurity (08/26/2023)  Housing: Low Risk  (08/26/2023)  Transportation Needs: No Transportation Needs (08/26/2023)  Utilities: Not At Risk (08/26/2023)  Social Connections: Unknown (08/26/2023)  Tobacco Use: Medium Risk (08/25/2023)    Readmission Risk Interventions     No data to display

## 2023-09-03 NOTE — Evaluation (Addendum)
Physical Therapy Re-Evaluation Patient Details Name: Tracey Wyatt MRN: [id number] DOB: 1956-04-02 Today's Date: 09/03/2023  History of Present Illness  Patient is a 68 year old female presenting to ER from group home and admitted secondary to progressive functional decline, unable to care for self, eat or drink. Family is now requesting SNF placement   Clinical Impression  Patient seen for PT re-evaluation. Spoke with brother at the bedside who reports the goal is for short term rehab. Today the patient is lethargic, suspect partially due to medications overnight. Multi modal sensory stimulation provided including tactile, auditory, proprioception, and vestibular to facilitate increased arousal in preparation for functional mobility. Patient required maximal assistance for bed mobility. Sitting balance is poor with external support required, brief periods of close stand by assistance. Standing and ambulation not attempted due to lethargy. The patient is not at her baseline level of functional independence. Recommend rehabilitation < 3 hours/day after this hospital stay. PT will continue to follow to maximize independence and decrease caregiver burden.       If plan is discharge home, recommend the following: Two people to help with walking and/or transfers;A lot of help with bathing/dressing/bathroom;Direct supervision/assist for medications management;Assist for transportation;Help with stairs or ramp for entrance;Supervision due to cognitive status   Can travel by private vehicle   No    Equipment Recommendations  (to be determined at next level of care)  Recommendations for Other Services  OT consult    Functional Status Assessment Patient has had a recent decline in their functional status and demonstrates the ability to make significant improvements in function in a reasonable and predictable amount of time.     Precautions / Restrictions Precautions Precautions:  Fall Restrictions Weight Bearing Restrictions Per Provider Order: No      Mobility  Bed Mobility Overal bed mobility: Needs Assistance Bed Mobility: Supine to Sit, Sit to Supine Rolling: Max assist   Supine to sit: Max assist Sit to supine: Max assist   General bed mobility comments: assistance for trunk and BLE support.    Transfers                   General transfer comment: not attempted due to lethargy. of note, patient was able to stand on 08/28/23 during PT session with +2 person assistance    Ambulation/Gait                  Stairs            Wheelchair Mobility     Tilt Bed    Modified Rankin (Stroke Patients Only)       Balance Overall balance assessment: Needs assistance Sitting-balance support: Feet supported Sitting balance-Leahy Scale: Poor Sitting balance - Comments: external support required initially. brief periods of stand by assistance. intermittent abnormal trunk movement and swaying                                     Pertinent Vitals/Pain Pain Assessment Pain Assessment: CPOT Breathing: normal Negative Vocalization: none Facial Expression: smiling or inexpressive Body Language: relaxed Consolability: no need to console PAINAD Score: 0    Home Living Family/patient expects to be discharged to:: Group home                        Prior Function Prior Level of Function : Needs assist  Mobility Comments: Supervision for ADLs, household mobilization with rollator; brother does endorse progressive decline in recent 3-4 months with increased assist required for all functional tasks/mobility.  Does endorse 2-3 falls in previous 3-4 months ADLs Comments: Supervision for ADLs; able to feed self and effectively communicate wants/needs     Extremity/Trunk Assessment   Upper Extremity Assessment Upper Extremity Assessment: Generalized weakness    Lower Extremity Assessment Lower  Extremity Assessment: Generalized weakness       Communication   Communication Communication: Impaired Factors Affecting Communication: Difficulty expressing self;Reduced clarity of speech    Cognition Arousal: Lethargic, Suspect due to medications Behavior During Therapy: Flat affect   PT - Cognitive impairments: History of cognitive impairments                       PT - Cognition Comments: patient has difficulty following commands today (suspected partially due to medications). multimodal sensory stimulation provided prior to mobility to increase arousal in preparation for higher level functional mobility Following commands: Impaired Following commands impaired: Follows one step commands inconsistently     Cueing Cueing Techniques: Verbal cues, Tactile cues     General Comments General comments (skin integrity, edema, etc.): spoke with patient's brother in the room who reports the goal is to go to rehab at discharge. education family on tips to promote sleep/wake cycle    Exercises     Assessment/Plan    PT Assessment Patient needs continued PT services  PT Problem List Decreased strength;Decreased range of motion;Decreased activity tolerance;Decreased balance;Decreased mobility;Decreased cognition;Decreased safety awareness       PT Treatment Interventions DME instruction;Gait training;Functional mobility training;Therapeutic activities;Therapeutic exercise;Balance training;Neuromuscular re-education;Cognitive remediation;Patient/family education;Wheelchair mobility training    PT Goals (Current goals can be found in the Care Plan section)  Acute Rehab PT Goals Patient Stated Goal: family goal is short term rehab PT Goal Formulation: With family Time For Goal Achievement: 09/17/23 Potential to Achieve Goals: Fair    Frequency Min 1X/week     Co-evaluation               AM-PAC PT "6 Clicks" Mobility  Outcome Measure Help needed turning from your  back to your side while in a flat bed without using bedrails?: A Lot Help needed moving from lying on your back to sitting on the side of a flat bed without using bedrails?: A Lot Help needed moving to and from a bed to a chair (including a wheelchair)?: Total Help needed standing up from a chair using your arms (e.g., wheelchair or bedside chair)?: Total Help needed to walk in hospital room?: Total Help needed climbing 3-5 steps with a railing? : Total 6 Click Score: 8    End of Session   Activity Tolerance: Patient limited by lethargy Patient left: in bed;with call bell/phone within reach;with bed alarm set;with family/visitor present Nurse Communication: Mobility status PT Visit Diagnosis: Unsteadiness on feet (R26.81);Muscle weakness (generalized) (M62.81)    Time: 1308-6578 PT Time Calculation (min) (ACUTE ONLY): 31 min   Charges:   PT Evaluation $PT Re-evaluation: 1 Re-eval PT Treatments $Therapeutic Activity: 8-22 mins PT General Charges $$ ACUTE PT VISIT: 1 Visit         Donna Bernard, PT, MPT   Ina Homes 09/03/2023, 9:41 AM

## 2023-09-03 NOTE — Progress Notes (Signed)
  [facility] Liaison Note   Will follow patient peripherally while patient is hospitalized.  [facility] can provide Palliative Care services to patient at a SNF.       Please don't hesitate to call with any Hospice related questions or concerns.    Thank you for the opportunity to participate in this patient's care.   [facility] Liaison [phone]

## 2023-09-03 NOTE — Progress Notes (Addendum)
Nutrition Brief Note  Chart reviewed. Comfort care rescinded on 09/02/23. Per palliative care notes, goals of care are clear; pt is DNR/ DNI, no feeding tube, and no antibiotics. They do not wish for pt to return to the hospital. Per hospice notes, pt does not not meet criteria for residential hospice placement at this time. Plan to discharge to SNF with outpatient palliative follow-up.  SLP re-instated dysphagia 1 diet with nectar thick liquids for pleasure.  No further nutrition interventions planned at this time.  Please re-consult as needed.   Levada Schilling, RD, LDN, CDCES Registered Dietitian III Certified Diabetes Care and Education Specialist If unable to reach this RD, please use "RD Inpatient" group chat on secure chat between hours of 8am-4 pm daily

## 2023-09-04 DIAGNOSIS — R4189 Other symptoms and signs involving cognitive functions and awareness: Secondary | ICD-10-CM

## 2023-09-04 MED ORDER — BENZTROPINE MESYLATE 1 MG PO TABS: 1.0000 mg | ORAL_TABLET | Freq: Three times a day (TID) | ORAL | 0 refills | Status: AC

## 2023-09-04 MED ORDER — OLANZAPINE 2.5 MG PO TABS: 2.5000 mg | ORAL_TABLET | Freq: Every day | ORAL | 0 refills | Status: AC

## 2023-09-04 MED ORDER — ACETAMINOPHEN EXTRA STRENGTH 500 MG PO TABS: 1000.0000 mg | ORAL_TABLET | Freq: Two times a day (BID) | ORAL | 0 refills | Status: AC | PRN

## 2023-09-04 NOTE — Plan of Care (Signed)

## 2023-09-04 NOTE — TOC Transition Note (Signed)
Transition of Care [facility]) - Discharge Note   Patient Details  Name: Tracey Wyatt MRN: [id number] Date of Birth: 1956-01-14  Transition of Care [facility]) CM/SW Contact:  Allena Katz, LCSW Phone Number: 09/04/2023, 10:22 AM   Clinical Narrative:   Pt discharging to [facility]. RN given number for report. Medical necessity printed to unit. Family notified. Pt going to room 65A.     Final next level of care: Skilled Nursing Facility Barriers to Discharge: Barriers Resolved   Patient Goals and CMS Choice   CMS Medicare.gov Compare Post Acute Care list provided to:: Patient Represenative (must comment) (son)        Discharge Placement                Patient to be transferred to facility by: [facility]   Patient and family notified of of transfer: 09/04/23  Discharge Plan and Services Additional resources added to the After Visit Summary for       Post Acute Care Choice: Hospice                               Social Drivers of Health (SDOH) Interventions SDOH Screenings   Food Insecurity: No Food Insecurity (08/26/2023)  Housing: Low Risk  (08/26/2023)  Transportation Needs: No Transportation Needs (08/26/2023)  Utilities: Not At Risk (08/26/2023)  Social Connections: Unknown (08/26/2023)  Tobacco Use: Medium Risk (08/25/2023)     Readmission Risk Interventions     No data to display

## 2023-09-04 NOTE — Discharge Instructions (Signed)
No straws.

## 2023-09-04 NOTE — Progress Notes (Signed)
Attempted to call report to [facility] and left number for them to call back.

## 2023-09-04 NOTE — Discharge Summary (Signed)
Physician Discharge Summary   Patient: Tracey Wyatt MRN: [id number] DOB: 1955-12-14  Admit date:     08/25/2023  Discharge date: 09/04/23  Discharge Physician: Laurin Paulo   PCP: Leia Alf, MD (Inactive)   Recommendations at discharge:    Discharge to SNF  Discharge Diagnoses: Principal Problem:   Cognitive deficits Active Problems:   Change in mental status   Seizure (HCC)   Protein calorie malnutrition (HCC)   HTN (hypertension)   Altered mental status   Failure to thrive in adult   MSSA bacteremia  Resolved Problems:   * No resolved hospital problems. [facility] Course:  [name], a 68 y/o with h/o HTN, tardive dyskinesia, vague history of seizure disorder, psychiatric disorders - depression, schizophrenia, pyromania. She took haldol for many years and developed tardive diskinesia but has been off haldol and risperidone for >10 years. She has resided in a long term care facility for 20 years. . Over the past year she has slowly declined: losing weight, hair turned gray, she had behavior problems. For  two months she has had a progressive decline and over the past several weeks has become withdrawn and less communicative, decreased PO intake, worsening weight loss, and progressive decline in ability to manage ADLs and ambulate. Her brother is the primary historian and has been involved with her care for years.    Detailed neurology consult note 02/07 for other hisory:   "While she was treated with Haldol and risperidone for many years they reports she has been off of any of those medications for at least 1 or 2 decades.  Unfortunately she has had progressive decline in her cognitive abilities as well as weight loss for the past year despite eating well   Due to her weight loss they were encouraging her to eat more and noted that she was still eating well 3 weeks ago although she was more slouched over fatigued appearing.  Now for the past 3 weeks she has  hardly been able to eat and not walking well   Additionally they note that 6 - 8 weeks ago she was noted to be more aggressive with the staff hitting etc. and thinks some medication changes may have been made at the time, but they are unclear on details"   She was seen in [facility] 08/24/23 for these problems. Her workup including labs, CT brain, U/A were unrevealing. She was returned to her long term care facility. She now returns to [facility] with worsening symptoms, essentially non-communicative.     02/04: to ED 02/05: admitted to hospitalist service, unable to get EEG until tomorrow. Nonverbal, somnolent.  02/06: More awake today but disoriented, slow/difficult speech. SLP, PT, OT to see. EEG technically difficult study suggestive of mild diffuse encephalopathy. Neuro consult ordered. Qualifies for SNF rehab but concern for po intake 02/07: awake and interactive today, not eating much. Neuro recs as below - w/u movement d/o for reversible causes, reviewing meds for possible reversible causes. See A/P for full discussion - if feeding tube would provide nutrition while treatment takes time to help her chorea, then this would be reasonable but if the condition is expected to stay stable or deteriorate family would not want feeding tube and will consider comfort measures 02/08: more somnolent today and not eating. Palliative to see - see consult note. WBC up, infectious w/u and abx.  02/09: more alert this morning, says she's hungry, making jokes. NG attempted but pt did not tolerate bedside placement. Continue abx  02/10: presented options but it is complicated, feeding tube has risks and would not likely address underlying frailty given how low BMI has gotten, but could try to address nutrition and see if this helps / buys time to reassess QOL. Option for G-tube placement under anesthesia and would consider MRI and address pelvic cyst as well while under anesthesia which would require detailed coordination  but is feasible, is also a lot to put the patient through. Family given time to consider. Family meeting was initially planned but decision made prior to that to transition to comfort measures, d/c aggressive workup and treatment, in line with patient/family goals  02/11: remains somnolent, stable, not eating. Hospice reassess - not candidate for hospice but we don't have a good option for placement at home or other facility, expect will decompensate fairly quickly given frailty, sepsis.  02/12: More awake and alert this morning.  Responsive.  Brother at the bedside.  Family will contact discharge to skilled nursing facility instead. 02/13: Lethargic.  Had received Ativan overnight for agitation.  Brother at the bedside.  Clarified with brother and sister-in-law over the phone and they state that they do not want any antibiotics and no aggressive interventions.  They want patient to be assessed by PT to see if she has any rehab potential for placement in a skilled nursing facility.     Assessment and Plan: Cognitive deficits Change in mental status /  Metabolic Encephalopathy  Progressive movement disorder working dx is tardive dyskinesia  Questionable seizure history  Stable. Discharge to SNF     SIRS/Sepsis Bacteremia - (+)BCx Staph Aureus  Urine culture yields Enterococcus faecalis and Staphylococcus simulans Antibiotics discontinued per family request     Protein calorie malnutrition  Weight loss Appreciate dietitian input Continue dysphagia 1 diet with nectar thick liquids Appreciate speech therapy input. No straws when feeding patient     Cystic pelvic mass 3.8 x 5.4 cm, favored benign and noninfectious per radiology s/p pelvic US Laxative and/or Stool softeners as needed     HTN (hypertension) Continue metoprolol   Hiatal Hernia, large Stable   Hypokalemia Supplemented   Advanced care planning Pt disoriented, does not have capacity to make decisions  Palliative care  discussed w/ family D/c meds and other workup No aggressive interventions 02/12.  Patient seen and examined at the bedside.  Awake and alert this morning. Family at the bedside and have requested PT and speech therapy consult, they want patient discharged to a skilled nursing facility and if she has progressive decline they would reconsider hospice again. 02/13.  Patient seen by physical therapy and has rehab potential.  For possible discharge to SNF once bed becomes available     underweight based on BMI: Body mass index is 15.33 kg/m.  Underweight - under 18  overweight - 25 to 29 obese - 30 or more Class 1 obesity: BMI of 30.0 to 34 Class 2 obesity: BMI of 35.0 to 39 Class 3 obesity: BMI of 40.0 to 49 Super Morbid Obesity: BMI 50-59 Super-super Morbid Obesity: BMI 60+ Significantly low or high BMI is associated with higher medical risk.  Weight management advised as adjunct to other disease management and risk reduction treatments Continue dysphagia diet and nutritional supplements                Consultants: Palliative care, Neurology, Infectious disease Procedures performed: None  Disposition: Skilled nursing facility Diet recommendation:  Dysphagia type 1 Nectar Liquid DISCHARGE MEDICATION: Allergies as of 09/04/2023  Reactions   A+d Diaper Rash [diaper Rash Products]    Morphine Other (See Comments)   Other Reaction: Not Assessed   Procaine Other (See Comments)        Medication List     STOP taking these medications    aspirin 325 MG tablet   gabapentin 300 MG capsule Commonly known as: NEURONTIN   levETIRAcetam 750 MG tablet Commonly known as: KEPPRA   naproxen 500 MG tablet Commonly known as: NAPROSYN       TAKE these medications    Acetaminophen Extra Strength 500 MG Tabs Take 2 tablets (1,000 mg total) by mouth 2 (two) times daily as needed (pain). What changed:  when to take this reasons to take this   alendronate 70 MG  tablet Commonly known as: FOSAMAX Take 70 mg by mouth once a week. Take with a full glass of water on an empty stomach EVERY WEDNESDAY   benztropine 1 MG tablet Commonly known as: COGENTIN Take 1 tablet (1 mg total) by mouth 3 (three) times daily. What changed:  medication strength how much to take when to take this Another medication with the same name was removed. Continue taking this medication, and follow the directions you see here.   busPIRone 7.5 MG tablet Commonly known as: BUSPAR Take 7.5 mg by mouth 2 (two) times daily.   divalproex 250 MG DR tablet Commonly known as: DEPAKOTE Take 250 mg by mouth daily.   esomeprazole 40 MG capsule Commonly known as: NEXIUM Take 40 mg by mouth daily.   ferrous sulfate 325 (65 FE) MG tablet Take 325 mg by mouth daily.   lisinopril 10 MG tablet Commonly known as: ZESTRIL Take 10 mg by mouth every morning.   metoprolol succinate 25 MG 24 hr tablet Commonly known as: TOPROL-XL Take 25 mg by mouth daily.   OLANZapine 2.5 MG tablet Commonly known as: ZyPREXA Take 1 tablet (2.5 mg total) by mouth at bedtime.   PARoxetine 40 MG tablet Commonly known as: PAXIL Take 40 mg by mouth at bedtime.   polyethylene glycol 17 g packet Commonly known as: MIRALAX / GLYCOLAX Take 17 g by mouth daily.   Vitamin D3 50 MCG (2000 UT) Tabs Take 50 mcg by mouth daily.        Contact information for follow-up providers     Leia Alf, MD Follow up.   Specialty: Internal Medicine Why: Hospital follow up Contact information: [address] [place] [address] [phone]              Contact information for after-discharge care     Destination     [facility] .   Service: Skilled Nursing Contact information: [address] [place] [address] [phone]                    Discharge Exam: Ceasar Mons Weights   08/26/23 0030 08/26/23 2147  Weight: 43.1  kg 36.2 kg   General: She is not in acute distress.  Lethargic    Appearance: She is not toxic-appearing.     Comments: frail,  Cardiovascular:     Rate and Rhythm: Normal rate and regular rhythm.  Pulmonary:     Effort: Pulmonary effort is normal.  Abdominal:     General: Abdomen is flat.     Palpations: Abdomen is soft.  Skin:    General: Skin is dry.  Neurological:     Mental Status: Lethargic but opens eyes  to loud stimuli    Condition at discharge: poor  The results of significant diagnostics from this hospitalization (including imaging, microbiology, ancillary and laboratory) are listed below for reference.   Imaging Studies: ECHOCARDIOGRAM COMPLETE Result Date: 08/31/2023    ECHOCARDIOGRAM REPORT   Patient Name:   Tracey Wyatt Date of Exam: 08/31/2023 Medical Rec #:  [id number]         Height:       66.0 in Accession #:    [id number]        Weight:       79.8 lb Date of Birth:  [birth year]         BSA:          1.353 m Patient Age:    68 years          BP:           123/83 mmHg Patient Gender: F                 HR:           97 bpm. Exam Location:  [facility] Procedure: 2D Echo, Cardiac Doppler and Color Doppler Indications:     Bacteremia  History:         Patient has no prior history of Echocardiogram examinations.                  Signs/Symptoms:Bacteremia and Altered Mental Status; Risk                  Factors:Hypertension.  Sonographer:     Mikki Harbor Referring Phys:  [id number] Lynn Ito Diagnosing Phys: Rozell Searing Custovic IMPRESSIONS  1. Left ventricular ejection fraction, by estimation, is 35 to 40%. Left ventricular ejection fraction by 2D MOD biplane is 37.8 %. The left ventricle has moderately decreased function. The left ventricle demonstrates regional wall motion abnormalities (see scoring diagram/findings for description). Left ventricular diastolic parameters are consistent with Grade I diastolic dysfunction (impaired relaxation).  2. Right ventricular systolic  function is normal. The right ventricular size is normal. There is normal pulmonary artery systolic pressure. The estimated right ventricular systolic pressure is 27.8 mmHg.  3. The mitral valve is normal in structure. Trivial mitral valve regurgitation. No evidence of mitral stenosis.  4. The aortic valve is normal in structure. Aortic valve regurgitation is not visualized. No aortic stenosis is present. FINDINGS  Left Ventricle: Left ventricular ejection fraction, by estimation, is 35 to 40%. Left ventricular ejection fraction by 2D MOD biplane is 37.8 %. The left ventricle has moderately decreased function. The left ventricle demonstrates regional wall motion abnormalities. The left ventricular internal cavity size was normal in size. There is no left ventricular hypertrophy. Left ventricular diastolic parameters are consistent with Grade I diastolic dysfunction (impaired relaxation).  LV Wall Scoring: The mid and distal anterior septum, mid inferoseptal segment, and apex are hypokinetic. Right Ventricle: The right ventricular size is normal. No increase in right ventricular wall thickness. Right ventricular systolic function is normal. There is normal pulmonary artery systolic pressure. The tricuspid regurgitant velocity is 2.49 m/s, and  with an assumed right atrial pressure of 3 mmHg, the estimated right ventricular systolic pressure is 27.8 mmHg. Left Atrium: Left atrial size was normal in size. Right Atrium: Right atrial size was normal in size. Pericardium: There is no evidence of pericardial effusion. Mitral Valve: The mitral valve is normal in structure. Trivial mitral valve regurgitation. No evidence of mitral valve stenosis. MV peak gradient, 6.2 mmHg. The  mean mitral valve gradient is 2.0 mmHg. Tricuspid Valve: The tricuspid valve is normal in structure. Tricuspid valve regurgitation is trivial. Aortic Valve: The aortic valve is normal in structure. Aortic valve regurgitation is not visualized. No  aortic stenosis is present. Aortic valve mean gradient measures 2.0 mmHg. Aortic valve peak gradient measures 4.8 mmHg. Aortic valve area, by VTI measures 2.57 cm. Pulmonic Valve: The pulmonic valve was normal in structure. Pulmonic valve regurgitation is not visualized. Aorta: The aortic root is normal in size and structure. IAS/Shunts: No atrial level shunt detected by color flow Doppler.  LEFT VENTRICLE PLAX 2D                        Biplane EF (MOD) LVIDd:         4.40 cm         LV Biplane EF:   Left LVIDs:         3.10 cm                          ventricular LV PW:         1.30 cm                          ejection LV IVS:        1.20 cm                          fraction by LVOT diam:     2.10 cm                          2D MOD LV SV:         39                               biplane is LV SV Index:   29                               37.8 %. LVOT Area:     3.46 cm                                Diastology                                LV e' medial:    3.15 cm/s LV Volumes (MOD)               LV E/e' medial:  15.7 LV vol d, MOD    43.9 ml       LV e' lateral:   7.18 cm/s A2C:                           LV E/e' lateral: 6.9 LV vol d, MOD    69.1 ml A4C: LV vol s, MOD    29.3 ml A2C: LV vol s, MOD    41.7 ml A4C: LV SV MOD A2C:   14.6 ml LV SV MOD A4C:   69.1 ml LV SV MOD BP:    21.1 ml RIGHT VENTRICLE RV Basal diam:  3.05  cm RV Mid diam:    2.30 cm RV S prime:     27.50 cm/s TAPSE (M-mode): 2.0 cm LEFT ATRIUM           Index        RIGHT ATRIUM          Index LA diam:      2.60 cm 1.92 cm/m   RA Area:     8.89 cm LA Vol (A2C): 21.3 ml 15.74 ml/m  RA Volume:   17.30 ml 12.78 ml/m LA Vol (A4C): 16.0 ml 11.82 ml/m  AORTIC VALVE                    PULMONIC VALVE AV Area (Vmax):    2.63 cm     PV Vmax:       1.67 m/s AV Area (Vmean):   2.64 cm     PV Peak grad:  11.2 mmHg AV Area (VTI):     2.57 cm AV Vmax:           109.50 cm/s AV Vmean:          63.100 cm/s AV VTI:            0.152 m AV Peak Grad:      4.8  mmHg AV Mean Grad:      2.0 mmHg LVOT Vmax:         83.00 cm/s LVOT Vmean:        48.100 cm/s LVOT VTI:          0.113 m LVOT/AV VTI ratio: 0.74  AORTA Ao Root diam: 3.40 cm MITRAL VALVE                TRICUSPID VALVE MV Area (PHT): 4.31 cm     TR Peak grad:   24.8 mmHg MV Area VTI:   1.80 cm     TR Vmax:        249.00 cm/s MV Peak grad:  6.2 mmHg MV Mean grad:  2.0 mmHg     SHUNTS MV Vmax:       1.25 m/s     Systemic VTI:  0.11 m MV Vmean:      59.5 cm/s    Systemic Diam: 2.10 cm MV Decel Time: 176 msec MV E velocity: 49.30 cm/s MV A velocity: 105.00 cm/s MV E/A ratio:  0.47 Designer, multimedia signed by Clotilde Dieter Signature Date/Time: 08/31/2023/4:27:41 PM    Final    US PELVIS (TRANSABDOMINAL ONLY) Result Date: 08/31/2023 CLINICAL DATA:  68 year old female with a cystic lesion in the posterior left hemipelvis on CT 3 days ago. Further characterization. EXAM: TRANSABDOMINAL ULTRASOUND OF PELVIS TECHNIQUE: Transabdominal ultrasound examination of the pelvis was performed including evaluation of the uterus, ovaries, adnexal regions, and pelvic cul-de-sac. COMPARISON:  CT Abdomen and Pelvis 08/28/2023. FINDINGS: Uterus Measurements: 5.0 x 1.6 x 1.9 cm = volume: 8 mL. No fibroids or other mass visualized. Endometrium Thickness: 3 mm.  No focal abnormality visualized. Right ovary No normal right ovarian parenchyma can be identified. Left ovary No normal left ovarian parenchyma can be identified. Other findings: Foley catheter decompressing the urinary bladder now (images 22, 38). Oval mildly irregularly shaped fluid collection in the left hemipelvis corresponding to the CT finding is 61 x 33 x 52 mm. Estimated volume 52 mL. This is in the region of the left adnexa. Perhaps minimal internal septation (image 40). But there are no vascular elements on color Doppler (image 28). And  otherwise simple fluid density. No superimposed layering free fluid identified in the pelvis. IMPRESSION: 1. Left  hemipelvis simple fluid density collection with no vascular elements measuring up to 6.1 cm, estimated volume 52 mL. This is in the vicinity of the left adnexa, although neither ovary can be identified. 2. Diminutive, normal uterus. Electronically Signed   By: Odessa Fleming M.D.   On: 08/31/2023 10:56   [place] EKG SITE RITE Result Date: 08/30/2023 If Site Rite image not attached, placement could not be confirmed due to current cardiac rhythm.  DG Chest Port 1 View Result Date: 08/29/2023 CLINICAL DATA:  Sirs EXAM: PORTABLE CHEST 1 VIEW COMPARISON:  08/24/2023 FINDINGS: Heart and mediastinal contours within normal limits. Left lower lobe retrocardiac opacity. Right lung clear. No visual effusions or acute bony abnormality. IMPRESSION: Left lower lobe retrocardiac atelectasis or infiltrate. Electronically Signed   By: Charlett Nose M.D.   On: 08/29/2023 17:09   CT CHEST ABDOMEN PELVIS W CONTRAST Result Date: 08/28/2023 CLINICAL DATA:  Weakness. Weight loss. Occult malignancy. * Tracking Code: BO * EXAM: CT CHEST, ABDOMEN, AND PELVIS WITH CONTRAST TECHNIQUE: Multidetector CT imaging of the chest, abdomen and pelvis was performed following the standard protocol during bolus administration of intravenous contrast. RADIATION DOSE REDUCTION: This exam was performed according to the departmental dose-optimization program which includes automated exposure control, adjustment of the mA and/or kV according to patient size and/or use of iterative reconstruction technique. CONTRAST:  OMNIPAQUE IOHEXOL 300 MG/ML  SOLN COMPARISON:  None Available. FINDINGS: CT CHEST FINDINGS Cardiovascular: The heart size is normal. No substantial pericardial effusion. Mild atherosclerotic calcification is noted in the wall of the thoracic aorta. Aberrant origin right subclavian artery noted. Mediastinum/Nodes: No mediastinal lymphadenopathy. There is no hilar lymphadenopathy. Large hiatal hernia. The esophagus has normal imaging features. There  is no axillary lymphadenopathy. Lungs/Pleura: No suspicious pulmonary nodule or mass. Left base atelectasis with small left pleural effusion. Musculoskeletal: No worrisome lytic or sclerotic osseous abnormality. Chronic superior endplate compression deformity noted at T12. CT ABDOMEN PELVIS FINDINGS Hepatobiliary: No suspicious focal abnormality within the liver parenchyma. History of cholecystectomy. No intrahepatic or extrahepatic biliary dilation. Common bile duct measures 4 mm diameter in the pancreatic head. Pancreas: Diffuse parenchymal atrophy without main duct dilatation. Spleen: 2.7 cm homogeneous water density lesion in the posterior spleen has benign appearance, suggesting cyst or pseudocyst. Adrenals/Urinary Tract: 2 cm right adrenal nodule has a relative washout of 58%, most suggestive of benign adenoma. No left adrenal nodule or mass. Kidneys unremarkable. No evidence for hydroureter. Bladder is distended. Stomach/Bowel: Large hiatal hernia. Distal stomach and duodenum are nondilated No small bowel wall thickening. No small bowel dilatation. The terminal ileum is not discretely visualized. The appendix is not well visualized, but there is no edema or inflammation in the region of the cecal tip to suggest appendicitis. Colon is largely decompressed although there is some mild gaseous distension in the hepatic flexure. Prominent rectal stool volume. Vascular/Lymphatic: There is mild atherosclerotic calcification of the abdominal aorta without aneurysm. There is no gastrohepatic or hepatoduodenal ligament lymphadenopathy. No retroperitoneal or mesenteric lymphadenopathy. No pelvic sidewall lymphadenopathy. Reproductive: Uterus not well visualized. No evidence for right adnexal mass. 5.4 x 3.8 cm unilocular cystic lesion identified posterior left pelvis. This appears to generate mass-effect on the rectum (see coronal 57/5) suggesting it represents a loculated fluid collection or cyst rather than free fluid  in the pelvis. Other: There is diffuse mesenteric and body wall edema. Musculoskeletal: Small gas bubble is  identified in the subcutaneous fat of the low anterior midline abdominal wall, presumably an injection site. Markedly heterogeneous mineralization of the sacrum. This is probably related to bilateral insufficiency fractures. Imaging appearance is not entirely characteristic for Paget's disease. Given lack of sclerotic lesions elsewhere, metastatic involvement is considered less likely but not entirely excluded. IMPRESSION: 1. No definite evidence for malignancy in the chest, abdomen, or pelvis. 2. Markedly heterogeneous mineralization of the sacrum. This is probably related to bilateral insufficiency fractures. Imaging appearance is not entirely characteristic for Paget's disease. Given lack of sclerotic lesions elsewhere, metastatic involvement is considered less likely but not entirely excluded. 3. 5.4 x 3.8 cm unilocular cystic lesion posterior left pelvis. This appears to generate mass-effect on the rectum suggesting it represents a loculated fluid collection or cyst rather than free fluid in the pelvis. While not overtly concerning by CT imaging, pelvic ultrasound recommended to further evaluate. 4. Large hiatal hernia. 5. Small left pleural effusion with left base atelectasis. 6. 2 cm right adrenal nodule has a relative washout of 58%, most suggestive of benign adenoma. 7. Diffuse mesenteric and body wall edema. 8.  Aortic Atherosclerosis (ICD10-I70.0). Electronically Signed   By: Kennith Center M.D.   On: 08/28/2023 16:45   EEG adult Result Date: 08/27/2023 Charlsie Quest, MD     08/27/2023  2:36 PM Patient Name: Tracey Wyatt MRN: [id number] Epilepsy Attending: Charlsie Quest Referring Physician/Provider: Jacques Navy, MD Date: 08/27/2023 Duration: 25.33 mins Patient history: 68yo female with ams. EEG to evaluate for seizure Level of alertness: Awake AEDs during EEG study: VPA Technical  aspects: This EEG study was done with scalp electrodes positioned according to the 10-20 International system of electrode placement. Electrical activity was reviewed with band pass filter of 1-70Hz , sensitivity of 7 uV/mm, display speed of 69mm/sec with a 60Hz  notched filter applied as appropriate. EEG data were recorded continuously and digitally stored.  Video monitoring was available and reviewed as appropriate. Description: No clear posterior dominant rhythm was seen.  EEG showed continuous generalized 5 to 7 Hz theta slowing.  Of note patient was noted to have near continuous movements of mouth and head consistent with her history of tardive dyskinesia's.  Therefore EEG was difficult due to movement artifact.  Hyperventilation and photic stimulation were not performed.   ABNORMALITY - Continuous slow, generalized IMPRESSION: This technically difficult study is suggestive of mild diffuse encephalopathy. No seizures or epileptiform discharges were seen throughout the recording. Charlsie Quest   DG Abd 1 View Result Date: 08/27/2023 CLINICAL DATA:  Abdominal pain EXAM: ABDOMEN - 1 VIEW COMPARISON:  None Available. FINDINGS: The bowel gas pattern is normal. No radio-opaque calculi or other significant radiographic abnormality are seen. IMPRESSION: Negative. Electronically Signed   By: Deatra Robinson M.D.   On: 08/27/2023 04:08   DG Chest Portable 1 View Result Date: 08/24/2023 CLINICAL DATA:  Unresponsive since Friday. EXAM: PORTABLE CHEST 1 VIEW COMPARISON:  07/24/2023 FINDINGS: Shallow inspiration. Heart size and pulmonary vascularity are normal for technique. Lungs are clear. No pleural effusion. No pneumothorax. Mediastinal contours appear intact. Calcification of the aorta. Degenerative changes in the spine and shoulder. IMPRESSION: No active disease. Electronically Signed   By: Burman Nieves M.D.   On: 08/24/2023 22:10   CT HEAD WO CONTRAST ( ) Result Date: 08/24/2023 CLINICAL DATA:  nonfocal  altered EXAM: CT HEAD WITHOUT CONTRAST TECHNIQUE: Contiguous axial images were obtained from the base of the skull through the vertex without intravenous contrast. RADIATION  DOSE REDUCTION: This exam was performed according to the departmental dose-optimization program which includes automated exposure control, adjustment of the mA and/or kV according to patient size and/or use of iterative reconstruction technique. COMPARISON:  CT head 07/24/2023, CT head 07/07/2023 FINDINGS: Brain: Patchy and confluent areas of decreased attenuation are noted throughout the deep and periventricular white matter of the cerebral hemispheres bilaterally, compatible with chronic microvascular ischemic disease. No evidence of large-territorial acute infarction. No parenchymal hemorrhage. No mass lesion. No extra-axial collection. No mass effect or midline shift. No hydrocephalus. Basilar cisterns are patent. Vascular: No hyperdense vessel. Skull: No acute fracture or focal lesion. Sinuses/Orbits: Paranasal sinuses and mastoid air cells are clear. Bilateral lens replacement. Otherwise the orbits are unremarkable. Other: None. IMPRESSION: No acute intracranial abnormality. Electronically Signed   By: Tish Frederickson M.D.   On: 08/24/2023 21:43    Microbiology: Results for orders placed or performed during the hospital encounter of 08/25/23  MRSA Next Gen by PCR, Nasal     Status: None   Collection Time: 08/27/23  6:00 AM   Specimen: Nasal Mucosa; Nasal Swab  Result Value Ref Range Status   MRSA by PCR Next Gen NOT DETECTED NOT DETECTED Final    Comment: (NOTE) The GeneXpert MRSA Assay (FDA approved for NASAL specimens only), is one component of a comprehensive MRSA colonization surveillance program. It is not intended to diagnose MRSA infection nor to guide or monitor treatment for MRSA infections. Test performance is not FDA approved in patients less than 71 years old. Performed at [facility], [address] [place], [place] [address]   Culture, blood (Routine X 2) w Reflex to ID Panel     Status: Abnormal   Collection Time: 08/29/23  3:09 PM   Specimen: BLOOD  Result Value Ref Range Status   Specimen Description   Final    BLOOD RAC Performed at [facility], [address] [place], [place] [address]    Special Requests   Final    BOTTLES DRAWN AEROBIC AND ANAEROBIC Blood Culture adequate volume Performed at [facility], [address], [place], [place] [address]    Culture  Setup Time   Final    GRAM POSITIVE COCCI IN BOTH AEROBIC AND ANAEROBIC BOTTLES Organism ID to follow CRITICAL RESULT CALLED TO, READ BACK BY AND VERIFIED WITH: NATHAN BELUE @ 0124 08/30/23 [facility] Performed at [facility], [address] [place], [place] [address]    Culture STAPHYLOCOCCUS AUREUS (A)  Final   Report Status 09/01/2023 FINAL  Final   Organism ID, Bacteria STAPHYLOCOCCUS AUREUS  Final      Susceptibility   Staphylococcus aureus - MIC*    CIPROFLOXACIN <=0.5 SENSITIVE Sensitive     ERYTHROMYCIN RESISTANT Resistant     GENTAMICIN <=0.5 SENSITIVE Sensitive     OXACILLIN <=0.25 SENSITIVE Sensitive     TETRACYCLINE <=1 SENSITIVE Sensitive     VANCOMYCIN 1 SENSITIVE Sensitive     TRIMETH/SULFA <=10 SENSITIVE Sensitive     CLINDAMYCIN RESISTANT Resistant     RIFAMPIN <=0.5 SENSITIVE Sensitive     Inducible Clindamycin POSITIVE Resistant     LINEZOLID 2 SENSITIVE Sensitive     * STAPHYLOCOCCUS AUREUS  Culture, blood (Routine X 2) w Reflex to ID Panel     Status: Abnormal   Collection Time: 08/29/23  3:09 PM   Specimen: BLOOD  Result Value Ref Range Status   Specimen Description   Final    BLOOD LAC Performed  at [facility], [address], [place], [place] [address]    Special Requests   Final    BOTTLES DRAWN AEROBIC AND ANAEROBIC Blood Culture adequate volume Performed at [facility], [address] [place], [place] [address]     Culture  Setup Time   Final    GRAM POSITIVE COCCI IN BOTH AEROBIC AND ANAEROBIC BOTTLES CRITICAL RESULT CALLED TO, READ BACK BY AND VERIFIED WITHDawayne Cirri @ 1027 08/30/23 [facility] Performed at [facility], [address] [place], [place] [address]    Culture (A)  Final    STAPHYLOCOCCUS AUREUS SUSCEPTIBILITIES PERFORMED ON PREVIOUS CULTURE WITHIN THE LAST 5 DAYS. Performed at [facility], [address], [place], [place] [address]    Report Status 09/01/2023 FINAL  Final  Blood Culture ID Panel (Reflexed)     Status: Abnormal   Collection Time: 08/29/23  3:09 PM  Result Value Ref Range Status   Enterococcus faecalis NOT DETECTED NOT DETECTED Final   Enterococcus Faecium NOT DETECTED NOT DETECTED Final   Listeria monocytogenes NOT DETECTED NOT DETECTED Final   Staphylococcus species DETECTED (A) NOT DETECTED Final    Comment: CRITICAL RESULT CALLED TO, READ BACK BY AND VERIFIED WITH: NATHAN BELUE @ 0124 08/30/23 [facility]    Staphylococcus aureus (BCID) DETECTED (A) NOT DETECTED Final    Comment: CRITICAL RESULT CALLED TO, READ BACK BY AND VERIFIED WITH: NATHAN BELUE @ 0124 08/30/23 [facility]    Staphylococcus epidermidis NOT DETECTED NOT DETECTED Final   Staphylococcus lugdunensis NOT DETECTED NOT DETECTED Final   Streptococcus species NOT DETECTED NOT DETECTED Final   Streptococcus agalactiae NOT DETECTED NOT DETECTED Final   Streptococcus pneumoniae NOT DETECTED NOT DETECTED Final   Streptococcus pyogenes NOT DETECTED NOT DETECTED Final   A.calcoaceticus-baumannii NOT DETECTED NOT DETECTED Final   Bacteroides fragilis NOT DETECTED NOT DETECTED Final   Enterobacterales NOT DETECTED NOT DETECTED Final   Enterobacter cloacae complex NOT DETECTED NOT DETECTED Final   Escherichia coli NOT DETECTED NOT DETECTED Final   Klebsiella aerogenes NOT DETECTED NOT DETECTED Final   Klebsiella oxytoca NOT DETECTED NOT DETECTED Final   Klebsiella pneumoniae NOT DETECTED NOT DETECTED Final    Proteus species NOT DETECTED NOT DETECTED Final   Salmonella species NOT DETECTED NOT DETECTED Final   Serratia marcescens NOT DETECTED NOT DETECTED Final   Haemophilus influenzae NOT DETECTED NOT DETECTED Final   Neisseria meningitidis NOT DETECTED NOT DETECTED Final   Pseudomonas aeruginosa NOT DETECTED NOT DETECTED Final   Stenotrophomonas maltophilia NOT DETECTED NOT DETECTED Final   Candida albicans NOT DETECTED NOT DETECTED Final   Candida auris NOT DETECTED NOT DETECTED Final   Candida glabrata NOT DETECTED NOT DETECTED Final   Candida krusei NOT DETECTED NOT DETECTED Final   Candida parapsilosis NOT DETECTED NOT DETECTED Final   Candida tropicalis NOT DETECTED NOT DETECTED Final   Cryptococcus neoformans/gattii NOT DETECTED NOT DETECTED Final   Meth resistant mecA/C and MREJ NOT DETECTED NOT DETECTED Final    Comment: Performed at [facility], [address] [place], [place] [address]  Urine Culture (for pregnant, neutropenic or urologic patients or patients with an indwelling urinary catheter)     Status: Abnormal   Collection Time: 08/30/23  8:59 AM   Specimen: In/Out Cath Urine  Result Value Ref Range Status   Specimen Description   Final    IN/OUT CATH URINE Performed at [facility], [address], [place], [place] [address]  Special Requests   Final    NONE Performed at [facility], [address] [place], [place] [address]    Culture (A)  Final    >=100,000 COLONIES/mL ENTEROCOCCUS FAECALIS >=100,000 COLONIES/mL STAPHYLOCOCCUS SIMULANS    Report Status 09/01/2023 FINAL  Final   Organism ID, Bacteria ENTEROCOCCUS FAECALIS (A)  Final   Organism ID, Bacteria STAPHYLOCOCCUS SIMULANS (A)  Final      Susceptibility   Enterococcus faecalis - MIC*    AMPICILLIN <=2 SENSITIVE Sensitive     NITROFURANTOIN <=16 SENSITIVE Sensitive     VANCOMYCIN 1 SENSITIVE Sensitive     * >=100,000 COLONIES/mL ENTEROCOCCUS FAECALIS   Staphylococcus  simulans - MIC*    CIPROFLOXACIN >=8 RESISTANT Resistant     GENTAMICIN <=0.5 SENSITIVE Sensitive     NITROFURANTOIN <=16 SENSITIVE Sensitive     OXACILLIN >=4 RESISTANT Resistant     TETRACYCLINE 2 SENSITIVE Sensitive     VANCOMYCIN <=0.5 SENSITIVE Sensitive     TRIMETH/SULFA <=10 SENSITIVE Sensitive     RIFAMPIN 1 SENSITIVE Sensitive     Inducible Clindamycin NEGATIVE Sensitive     * >=100,000 COLONIES/mL STAPHYLOCOCCUS SIMULANS  Culture, blood (Routine X 2) w Reflex to ID Panel     Status: None (Preliminary result)   Collection Time: 08/31/23  6:23 AM   Specimen: BLOOD RIGHT HAND  Result Value Ref Range Status   Specimen Description BLOOD RIGHT HAND  Final   Special Requests   Final    BOTTLES DRAWN AEROBIC AND ANAEROBIC Blood Culture results may not be optimal due to an inadequate volume of blood received in culture bottles   Culture   Final    NO GROWTH 4 DAYS Performed at [facility], [address] [place], [place] [address]    Report Status PENDING  Incomplete  Culture, blood (Routine X 2) w Reflex to ID Panel     Status: None (Preliminary result)   Collection Time: 08/31/23  2:01 PM   Specimen: BLOOD  Result Value Ref Range Status   Specimen Description BLOOD BLOOD RIGHT WRIST  Final   Special Requests   Final    BOTTLES DRAWN AEROBIC AND ANAEROBIC Blood Culture results may not be optimal due to an inadequate volume of blood received in culture bottles   Culture   Final    NO GROWTH 4 DAYS Performed at [facility], [address] [place], [place] [address]    Report Status PENDING  Incomplete    Labs: CBC: Recent Labs  Lab 08/29/23 1143 08/30/23 0917 08/31/23 0623  WBC 23.0* 27.3* 18.4*  NEUTROABS 21.2*  --   --   HGB 10.3* 10.0* 10.6*  HCT 29.6* 28.7* 29.1*  MCV 87.3 86.2 83.4  PLT 273 247 265   Basic Metabolic Panel: Recent Labs  Lab 08/29/23 1749 08/30/23 0909 08/30/23 0917 08/31/23 0623  NA  --   --  131* 133*  K  --   --   2.5* 2.5*  CL  --   --  97* 100  CO2  --   --  22 24  GLUCOSE  --   --  99 86  BUN  --   --  9 7*  CREATININE 0.33*  --  <0.30* 0.32*  CALCIUM  --   --  6.9* 7.4*  MG  --  1.5*  --  2.0  PHOS  --   --   --  1.4*   Liver Function Tests: Recent Labs  Lab 08/30/23 0917  AST 20  ALT 14  ALKPHOS 67  BILITOT 0.5  PROT 4.7*  ALBUMIN 2.0*   CBG: Recent Labs  Lab 08/28/23 2009 08/29/23 0803 08/29/23 1113 08/29/23 1557 08/29/23 2012  GLUCAP 109* 109* 105* 91 93    Discharge time spent: greater than 30 minutes.  Signed: Lucile Shutters, MD [facility] 09/04/2023

## 2023-09-05 LAB — CULTURE, BLOOD (ROUTINE X 2)
Culture: NO GROWTH
Culture: NO GROWTH

## 2023-09-10 LAB — MISC LABCORP TEST (SEND OUT): Labcorp test code: 70115

## 2023-10-15 ENCOUNTER — Other Ambulatory Visit: Payer: Self-pay

## 2023-10-15 ENCOUNTER — Emergency Department
Admission: EM | Admit: 2023-10-15 | Discharge: 2023-10-15 | Disposition: A | Discharge status: Skilled Nursing Facility | Attending: Emergency Medicine | Admitting: Emergency Medicine

## 2023-10-15 DIAGNOSIS — W19XXXA Unspecified fall, initial encounter: Secondary | ICD-10-CM | POA: Diagnosis not present

## 2023-10-15 DIAGNOSIS — R4182 Altered mental status, unspecified: Secondary | ICD-10-CM | POA: Insufficient documentation

## 2023-10-15 DIAGNOSIS — Z515 Encounter for palliative care: Secondary | ICD-10-CM | POA: Insufficient documentation

## 2023-10-15 DIAGNOSIS — I10 Essential (primary) hypertension: Secondary | ICD-10-CM | POA: Diagnosis not present

## 2023-10-15 NOTE — ED Triage Notes (Signed)
 Pt to ED via [facility] from [facility] for altered mental status. Pt fell at 0430. staff stated that she was at baseline after the fall. Pt was altered at 0630. Pt is A&O x3 at baseline. EMS reports pt being nonverbal at this time. Pt has DNR and MOST form. Pt on hospice and comfort care measures at this time. EMS states she has been not responsive. Pt has spontaneous eye opening and in NAD at this time.  BP 100/65 Temp 101.1 HR 80 SPO2 100 2L [place]

## 2023-10-15 NOTE — ED Notes (Signed)
 This RN gave report to [place] at [facility] at this time.

## 2023-10-15 NOTE — ED Notes (Signed)
 Called [facility] to arrange transport back to [facility] @0917  spoke to [place] there are a few ahead should be around 12 for pick up

## 2023-10-15 NOTE — ED Provider Notes (Signed)
 [facility] Provider Note    Event Date/Time   First MD Initiated Contact with Patient 10/15/23 [phone]     (approximate)   History   Chief Complaint Altered Mental Status   HPI  Tracey Wyatt is a 68 y.o. female with past medical history of hypertension, seizures, schizophrenia, and tardive dyskinesia who presents to the ED for altered mental status.  Per EMS, patient had an unwitnessed fall at [facility] this morning around 4:30 AM.  She was found down on the ground but out her baseline mental status, was placed back in bed.  Staff then went to check on her around 645 this morning, found her less verbal and interactive than usual, so EMS was called.  EMS reports patient with temp of 101, for which patient was given IV Tylenol.  Patient reportedly more alert and conversant at baseline, today she is unable to provide any history and has difficulty following commands.  Of note, patient was admitted to the hospital last month for sepsis and bacteremia on top of overall decline and weight loss over about the past 6 months.  At that time, family had decided to stop antibiotics and transition to comfort care, patient has since been following with hospice care.     Physical Exam   Triage Vital Signs: ED Triage Vitals  Encounter Vitals Group     BP      Systolic BP Percentile      Diastolic BP Percentile      Pulse      Resp      Temp      Temp src      SpO2      Weight      Height      Head Circumference      Peak Flow      Pain Score      Pain Loc      Pain Education      Exclude from Growth Chart     Most recent vital signs: Vitals:   10/15/23 0816  BP: 100/65  Pulse: 75  Resp: 16  Temp: 99.1 F (37.3 C)  SpO2: 100%    Constitutional: Awake and alert, unable to answer questions.  Thin and cachectic appearing. Eyes: Conjunctivae are normal. Head: Atraumatic. Nose: No congestion/rhinnorhea. Mouth/Throat: Mucous membranes are  moist.  Neck: No midline cervical spine tenderness to palpation. Cardiovascular: Normal rate, regular rhythm. Grossly normal heart sounds.  2+ radial pulses bilaterally. Respiratory: Normal respiratory effort.  No retractions. Lungs CTAB.  No chest wall tenderness to palpation. Gastrointestinal: Soft and nontender. No distention. Musculoskeletal: No lower extremity tenderness nor edema.  Neurologic: No verbal response.  Localizes to pain in all 4 extremities, unable to follow commands.    ED Results / Procedures / Treatments   Labs (all labs ordered are listed, but only abnormal results are displayed) Labs Reviewed - No data to display   PROCEDURES:  Critical Care performed: No  Procedures   MEDICATIONS ORDERED IN ED: Medications - No data to display   IMPRESSION / MDM / ASSESSMENT AND PLAN / ED COURSE  I reviewed the triage vital signs and the nursing notes.                              68 y.o. female with past medical history of hypertension, seizures, schizophrenia, and tardive dyskinesia who presents to the ED for unwitnessed fall  at her nursing facility earlier this morning, subsequently found to be febrile and altered.  Patient's presentation is most consistent with acute, uncomplicated illness.  Differential diagnosis includes, but is not limited to, intracranial injury, cervical spine injury, sepsis, UTI, pneumonia, bacteremia, anemia, electrolyte abnormality, AKI.  Patient chronically ill-appearing but in no acute distress, nonverbal but localizes to pain in all 4 extremities.  She arrives with DNR and MOST form stating that her medical POA, which is her brother, has opted for comfort care only with no IV fluids or antibiotics.  I spoke with brother over the phone, who confirmed that he would not want any imaging to evaluate for traumatic injury and no labs or other workup to assess for infectious process.  He is only interested in interventions that would make patient  comfortable, does not desire any further workup here in the ED.  He is in agreement with discharge back to her nursing facility, patient does not appear in any pain at this time.      FINAL CLINICAL IMPRESSION(S) / ED DIAGNOSES   Final diagnoses:  Fall, initial encounter  Hospice care patient     Rx / DC Orders   ED Discharge Orders     None        Note:  This document was prepared using Dragon voice recognition software and may include unintentional dictation errors.   Chesley Noon, MD 10/15/23 [phone]
